# Patient Record
Sex: Female | Born: 1935 | Race: White | Hispanic: No | Smoking: Former smoker
Health system: Southern US, Community
[De-identification: ages and names within clinical notes are randomized; demographics above are authoritative.]

## PROBLEM LIST (undated history)

## (undated) DIAGNOSIS — L565 Disseminated superficial actinic porokeratosis (DSAP): Secondary | ICD-10-CM

## (undated) DIAGNOSIS — I779 Disorder of arteries and arterioles, unspecified: Secondary | ICD-10-CM

## (undated) DIAGNOSIS — M069 Rheumatoid arthritis, unspecified: Secondary | ICD-10-CM

## (undated) DIAGNOSIS — F329 Major depressive disorder, single episode, unspecified: Secondary | ICD-10-CM

## (undated) DIAGNOSIS — I10 Essential (primary) hypertension: Secondary | ICD-10-CM

## (undated) DIAGNOSIS — K589 Irritable bowel syndrome without diarrhea: Secondary | ICD-10-CM

## (undated) DIAGNOSIS — M8580 Other specified disorders of bone density and structure, unspecified site: Secondary | ICD-10-CM

## (undated) DIAGNOSIS — J449 Chronic obstructive pulmonary disease, unspecified: Secondary | ICD-10-CM

## (undated) DIAGNOSIS — G43909 Migraine, unspecified, not intractable, without status migrainosus: Secondary | ICD-10-CM

## (undated) DIAGNOSIS — N183 Chronic kidney disease, stage 3 (moderate): Principal | ICD-10-CM

## (undated) DIAGNOSIS — K219 Gastro-esophageal reflux disease without esophagitis: Secondary | ICD-10-CM

## (undated) DIAGNOSIS — F419 Anxiety disorder, unspecified: Secondary | ICD-10-CM

## (undated) DIAGNOSIS — F32A Depression, unspecified: Secondary | ICD-10-CM

## (undated) DIAGNOSIS — Z860101 Personal history of adenomatous and serrated colon polyps: Secondary | ICD-10-CM

## (undated) DIAGNOSIS — Z86010 Personal history of colonic polyps: Secondary | ICD-10-CM

## (undated) DIAGNOSIS — Z8679 Personal history of other diseases of the circulatory system: Secondary | ICD-10-CM

## (undated) DIAGNOSIS — E785 Hyperlipidemia, unspecified: Secondary | ICD-10-CM

## (undated) DIAGNOSIS — R413 Other amnesia: Secondary | ICD-10-CM

## (undated) DIAGNOSIS — IMO0002 Reserved for concepts with insufficient information to code with codable children: Secondary | ICD-10-CM

## (undated) DIAGNOSIS — I8390 Asymptomatic varicose veins of unspecified lower extremity: Secondary | ICD-10-CM

## (undated) DIAGNOSIS — I739 Peripheral vascular disease, unspecified: Secondary | ICD-10-CM

## (undated) HISTORY — DX: Peripheral vascular disease, unspecified: I73.9

## (undated) HISTORY — DX: Other specified disorders of bone density and structure, unspecified site: M85.80

## (undated) HISTORY — DX: Anxiety disorder, unspecified: F41.9

## (undated) HISTORY — PX: THYROGLOSSAL DUCT CYST: SHX297

## (undated) HISTORY — DX: Asymptomatic varicose veins of unspecified lower extremity: I83.90

## (undated) HISTORY — DX: Migraine, unspecified, not intractable, without status migrainosus: G43.909

## (undated) HISTORY — DX: Hyperlipidemia, unspecified: E78.5

## (undated) HISTORY — DX: Disseminated superficial actinic porokeratosis (DSAP): L56.5

## (undated) HISTORY — DX: Reserved for concepts with insufficient information to code with codable children: IMO0002

## (undated) HISTORY — DX: Chronic kidney disease, stage 3 (moderate): N18.3

## (undated) HISTORY — DX: Gastro-esophageal reflux disease without esophagitis: K21.9

## (undated) HISTORY — DX: Major depressive disorder, single episode, unspecified: F32.9

## (undated) HISTORY — DX: Personal history of other diseases of the circulatory system: Z86.79

## (undated) HISTORY — DX: Depression, unspecified: F32.A

## (undated) HISTORY — DX: Chronic obstructive pulmonary disease, unspecified: J44.9

## (undated) HISTORY — DX: Personal history of colonic polyps: Z86.010

## (undated) HISTORY — DX: Essential (primary) hypertension: I10

## (undated) HISTORY — DX: Rheumatoid arthritis, unspecified: M06.9

## (undated) HISTORY — DX: Disorder of arteries and arterioles, unspecified: I77.9

## (undated) HISTORY — DX: Irritable bowel syndrome, unspecified: K58.9

## (undated) HISTORY — DX: Personal history of adenomatous and serrated colon polyps: Z86.0101

## (undated) HISTORY — DX: Other amnesia: R41.3

---

## 1971-01-18 HISTORY — PX: OTHER SURGICAL HISTORY: SHX169

## 1997-09-14 ENCOUNTER — Inpatient Hospital Stay (HOSPITAL_COMMUNITY): Admission: EM | Admit: 1997-09-14 | Discharge: 1997-09-15 | Payer: Self-pay | Admitting: *Deleted

## 1997-09-15 ENCOUNTER — Encounter: Payer: Self-pay | Admitting: Pulmonary Disease

## 1998-12-08 ENCOUNTER — Other Ambulatory Visit: Admission: RE | Admit: 1998-12-08 | Discharge: 1998-12-08 | Payer: Self-pay | Admitting: *Deleted

## 2001-02-13 ENCOUNTER — Encounter (INDEPENDENT_AMBULATORY_CARE_PROVIDER_SITE_OTHER): Payer: Self-pay

## 2001-02-13 ENCOUNTER — Ambulatory Visit (HOSPITAL_COMMUNITY): Admission: RE | Admit: 2001-02-13 | Discharge: 2001-02-13 | Payer: Self-pay | Admitting: Gastroenterology

## 2004-05-16 ENCOUNTER — Emergency Department (HOSPITAL_COMMUNITY): Admission: EM | Admit: 2004-05-16 | Discharge: 2004-05-16 | Payer: Self-pay | Admitting: Family Medicine

## 2005-05-18 ENCOUNTER — Encounter: Payer: Self-pay | Admitting: Internal Medicine

## 2005-05-18 ENCOUNTER — Encounter: Payer: Self-pay | Admitting: *Deleted

## 2006-07-10 ENCOUNTER — Encounter: Payer: Self-pay | Admitting: Internal Medicine

## 2006-08-15 ENCOUNTER — Encounter: Payer: Self-pay | Admitting: Internal Medicine

## 2006-10-11 ENCOUNTER — Ambulatory Visit: Payer: Self-pay | Admitting: Internal Medicine

## 2006-10-15 ENCOUNTER — Encounter: Payer: Self-pay | Admitting: Internal Medicine

## 2006-10-15 DIAGNOSIS — I2510 Atherosclerotic heart disease of native coronary artery without angina pectoris: Secondary | ICD-10-CM | POA: Insufficient documentation

## 2006-10-15 DIAGNOSIS — Z8679 Personal history of other diseases of the circulatory system: Secondary | ICD-10-CM

## 2006-10-15 DIAGNOSIS — E785 Hyperlipidemia, unspecified: Secondary | ICD-10-CM

## 2006-10-15 DIAGNOSIS — I10 Essential (primary) hypertension: Secondary | ICD-10-CM | POA: Insufficient documentation

## 2006-10-15 DIAGNOSIS — J439 Emphysema, unspecified: Secondary | ICD-10-CM | POA: Insufficient documentation

## 2006-10-15 DIAGNOSIS — J449 Chronic obstructive pulmonary disease, unspecified: Secondary | ICD-10-CM

## 2006-10-15 DIAGNOSIS — G43909 Migraine, unspecified, not intractable, without status migrainosus: Secondary | ICD-10-CM | POA: Insufficient documentation

## 2006-10-15 DIAGNOSIS — F411 Generalized anxiety disorder: Secondary | ICD-10-CM | POA: Insufficient documentation

## 2006-10-15 DIAGNOSIS — J4489 Other specified chronic obstructive pulmonary disease: Secondary | ICD-10-CM | POA: Insufficient documentation

## 2006-10-15 HISTORY — DX: Personal history of other diseases of the circulatory system: Z86.79

## 2007-04-09 ENCOUNTER — Ambulatory Visit: Payer: Self-pay | Admitting: Internal Medicine

## 2007-04-09 DIAGNOSIS — K589 Irritable bowel syndrome without diarrhea: Secondary | ICD-10-CM

## 2007-04-10 DIAGNOSIS — F3289 Other specified depressive episodes: Secondary | ICD-10-CM | POA: Insufficient documentation

## 2007-04-10 DIAGNOSIS — F329 Major depressive disorder, single episode, unspecified: Secondary | ICD-10-CM

## 2007-04-10 LAB — CONVERTED CEMR LAB
ALT: 23 units/L (ref 0–35)
AST: 21 units/L (ref 0–37)
Albumin: 4.2 g/dL (ref 3.5–5.2)
Alkaline Phosphatase: 34 units/L — ABNORMAL LOW (ref 39–117)
BUN: 15 mg/dL (ref 6–23)
Basophils Absolute: 0 10*3/uL (ref 0.0–0.1)
Basophils Relative: 0.5 % (ref 0.0–1.0)
Bilirubin, Direct: 0.1 mg/dL (ref 0.0–0.3)
CO2: 30 meq/L (ref 19–32)
Calcium: 10 mg/dL (ref 8.4–10.5)
Chloride: 104 meq/L (ref 96–112)
Cholesterol: 227 mg/dL (ref 0–200)
Creatinine, Ser: 1 mg/dL (ref 0.4–1.2)
Direct LDL: 158.6 mg/dL
Eosinophils Absolute: 0.2 10*3/uL (ref 0.0–0.6)
Eosinophils Relative: 2.4 % (ref 0.0–5.0)
GFR calc Af Amer: 71 mL/min
GFR calc non Af Amer: 58 mL/min
Glucose, Bld: 113 mg/dL — ABNORMAL HIGH (ref 70–99)
HCT: 44.4 % (ref 36.0–46.0)
HDL: 52 mg/dL (ref >39.0)
Hemoglobin: 14.7 g/dL (ref 12.0–15.0)
Lymphocytes Relative: 22 % (ref 12.0–46.0)
MCHC: 33 g/dL (ref 30.0–36.0)
MCV: 95.2 fL (ref 78.0–100.0)
Monocytes Absolute: 0.5 10*3/uL (ref 0.2–0.7)
Monocytes Relative: 7.2 % (ref 3.0–11.0)
Neutro Abs: 4.4 10*3/uL (ref 1.4–7.7)
Neutrophils Relative %: 67.9 % (ref 43.0–77.0)
Platelets: 251 10*3/uL (ref 150–400)
Potassium: 4.1 meq/L (ref 3.5–5.1)
RBC: 4.66 M/uL (ref 3.87–5.11)
RDW: 12.3 % (ref 11.5–14.6)
Sodium: 143 meq/L (ref 135–145)
TSH: 0.9 microintl units/mL (ref 0.35–5.50)
Total Bilirubin: 1 mg/dL (ref 0.3–1.2)
Total CHOL/HDL Ratio: 4.4
Total Protein: 7 g/dL (ref 6.0–8.3)
Triglycerides: 74 mg/dL (ref 0–149)
VLDL: 15 mg/dL (ref 0–40)
WBC: 6.5 10*3/uL (ref 4.5–10.5)

## 2007-04-16 ENCOUNTER — Ambulatory Visit: Payer: Self-pay | Admitting: Internal Medicine

## 2007-07-26 ENCOUNTER — Other Ambulatory Visit: Admission: RE | Admit: 2007-07-26 | Discharge: 2007-07-26 | Payer: Self-pay | Admitting: Geriatric Medicine

## 2009-05-11 ENCOUNTER — Encounter: Payer: Self-pay | Admitting: Cardiology

## 2009-05-21 ENCOUNTER — Encounter: Admission: RE | Admit: 2009-05-21 | Discharge: 2009-05-21 | Payer: Self-pay | Admitting: Family Medicine

## 2009-05-21 ENCOUNTER — Ambulatory Visit: Payer: Self-pay | Admitting: Cardiology

## 2009-06-09 ENCOUNTER — Telehealth (INDEPENDENT_AMBULATORY_CARE_PROVIDER_SITE_OTHER): Payer: Self-pay

## 2009-06-10 ENCOUNTER — Ambulatory Visit: Payer: Self-pay

## 2009-06-10 ENCOUNTER — Ambulatory Visit (HOSPITAL_COMMUNITY): Admission: RE | Admit: 2009-06-10 | Discharge: 2009-06-10 | Payer: Self-pay | Admitting: Cardiology

## 2009-06-10 ENCOUNTER — Encounter (HOSPITAL_COMMUNITY): Admission: RE | Admit: 2009-06-10 | Discharge: 2009-08-19 | Payer: Self-pay | Admitting: Cardiology

## 2009-06-10 ENCOUNTER — Ambulatory Visit: Payer: Self-pay | Admitting: Internal Medicine

## 2009-07-02 ENCOUNTER — Telehealth (INDEPENDENT_AMBULATORY_CARE_PROVIDER_SITE_OTHER): Payer: Self-pay | Admitting: *Deleted

## 2009-08-21 ENCOUNTER — Ambulatory Visit: Payer: Self-pay | Admitting: Cardiology

## 2010-02-18 NOTE — Assessment & Plan Note (Signed)
Summary: Cardiology Nuclear Study  Nuclear Med Background Indications for Stress Test: Evaluation for Ischemia   History: COPD, Echo, Emphysema, Myocardial Perfusion Study  History Comments: '99 MPS at [facility]: no report in E-chart. Hx. Diastolic dysfunction Echo done today.  Symptoms: Chest Pressure, DOE, Fatigue, Fatigue with Exertion, Palpitations, Rapid HR  Symptoms Comments: LL Edema, some back pain.   Nuclear Pre-Procedure Cardiac Risk Factors: Family History - CAD, History of Smoking, Hypertension, Lipids Caffeine/Decaff Intake: None NPO After: 6:00 PM Lungs: Clear IV 0.9% NS with Angio Cath: 22g     IV Site: (R) AC IV Started by: Irean Hong RN Chest Size (in) 44     Cup Size B     Height (in): 64 Weight (lb): 183 BMI: 31.53 Tech Comments: Held coreg x 24 hrs.  Nuclear Med Study 1 or 2 day study:  1 day     Stress Test Type:  Stress Reading MD:  Arvilla Meres, MD     Referring MD:  B. Crenshaw Resting Radionuclide:  Technetium 72m Tetrofosmin     Resting Radionuclide Dose:  11.0 mCi  Stress Radionuclide:  Technetium 65m Tetrofosmin     Stress Radionuclide Dose:  33.0 mCi   Stress Protocol Exercise Time (min):  6:30 min     Max HR:  120 bpm     Predicted Max HR:  149 bpm  Max Systolic BP: 173 mm Hg     Percent Max HR:  80.54 %     METS: 7.0 Rate Pressure Product:  36644    Stress Test Technologist:  Irean Hong RN     Nuclear Technologist:  Domenic Polite CNMT  Rest Procedure  Myocardial perfusion imaging was performed at rest 45 minutes following the intravenous administration of Myoview Technetium 12m Tetrofosmin.  Stress Procedure  The patient exercised for 6 minutes and 30 seconds, RPE=15.   The patient stopped due to bilateral calf tightness 5/10, DOE with O2 sat 93%,     and denied any chest pain.  There were nonspecific ST-T wave changes, frequent PAC's, occ. PVC's.  Myoview was injected at peak exercise and myocardial perfusion imaging was performed  after a brief delay.  QPS Raw Data Images:  Normal; no motion artifact; normal heart/lung ratio. Stress Images:  There is normal uptake in all areas. Rest Images:  Normal homogeneous uptake in all areas of the myocardium. Subtraction (SDS):  Normal Transient Ischemic Dilatation:  .93  (Normal <1.22)  Lung/Heart Ratio:  .29  (Normal <0.45)  Quantitative Gated Spect Images QGS cine images:  not gated due to PACs  Findings Normal nuclear study      Overall Impression  Exercise Capacity: Fair exercise capacity. BP Response: Normal blood pressure response. Clinical Symptoms: There is dyspnea. Bilateral calf pain.  ECG Impression: No significant ST segment change suggestive of ischemia. +PACs Overall Impression: Normal stress nuclear study. Overall Impression Comments: Study not gated due to PACs  Appended Document: Cardiology Nuclear Study ok  Appended Document: Cardiology Nuclear Study LMOPT - labs negative, normal, or stable  Appended Document: Cardiology Nuclear Study pt aware of results

## 2010-02-18 NOTE — Assessment & Plan Note (Signed)
Summary: PER CHECK OUT/SF   CC:  check up.  History of Present Illness: 75 year old female I saw in May of 2011 for evaluation of congestive heart failure and hypertension. Echocardiogram in May of 2011 showed normal LV function, mild left atrial enlargement and grade 1 diastolic dysfunction. There was mild pulmonary hypertension. Patient had a Myoview in May of 2011. Perfusion was normal. The study was not gated. We felt that her dyspnea was most likely secondary to COPD. Since she was seen previously she feels better. The patient  has dyspnea with more extreme activities but not with routine activities. It is relieved with rest. It is not associated with chest pain. There is no orthopnea, PND or pedal edema. There is no syncope or palpitations. There is no exertional chest pain.   Current Medications (verified): 1)  Micardis 80 Mg Tabs (Telmisartan) .Marland Kitchen.. 1 Tab By Mouth Once Daily 2)  Coreg Cr 20 Mg Xr24h-Cap (Carvedilol Phosphate) .Marland Kitchen.. 1 Tab By Mouth Once Daily 3)  Alprazolam 0.25 Mg  Tabs (Alprazolam) .Marland Kitchen.. 1po Once Daily As Needed 4)  Caltrate 600+d 600-400 Mg-Unit  Tabs (Calcium Carbonate-Vitamin D) .Marland Kitchen.. 1 By Mouth Qd 5)  Centrum Silver   Tabs (Multiple Vitamins-Minerals) .Marland Kitchen.. 1 By Mouth Qd 6)  Vitamin B-12 500 Mcg  Tabs (Cyanocobalamin) .Marland Kitchen.. 1 By Mouth Qd 7)  Spiriva Handihaler 18 Mcg Caps (Tiotropium Bromide Monohydrate) .... As Directed 8)  Patanol 0.1 % Soln (Olopatadine Hcl) .... As Directed 9)  Aspirin 81 Mg Tbec (Aspirin) .Marland Kitchen.. 1 Tab By Mouth Bid 10)  Soya Lecithin 1200 Mg Caps (Lecithin) .Marland Kitchen.. 1 Tab By Mouth Once Daily 11)  Folic Acid 1 Mg Tabs (Folic Acid) .Marland Kitchen.. 1 Tab Once Daily 12)  Fish Oil   Oil (Fish Oil) .Marland Kitchen.. 1 Tab By Mouth Once Daily 13)  Flax   Oil (Flaxseed (Linseed)) .Marland Kitchen.. 1 Tab By Mouth Once Daily 14)  Alcon Tears .... As Directed 15)  Coreg Cr 10 Mg Xr24h-Cap (Carvedilol Phosphate) .Marland Kitchen.. 1 Tab By Mouth Once Daily 16)  Hydrochlorothiazide 25 Mg Tabs (Hydrochlorothiazide)  .... Take One Tablet By Mouth Daily. 17)  Icaps  Caps (Multiple Vitamins-Minerals) .Marland Kitchen.. 1 Tab By Mouth Once Daily 18)  Epipen .... As Needed 19)  Atrovent 0.06 % Soln (Ipratropium Bromide) .... Prn 20)  Doxycycline Hyclate 100 Mg Caps (Doxycycline Hyclate) .... As Needed 21)  Claritin 10 Mg Tabs (Loratadine) .... As Needed  Allergies: 1)  ! Pcn 2)  ! * Benzocaine 3)  ! Benadryl 4)  ! * Omnicef 5)  ! * Tiobramycin Eye Gtts 6)  ! * E-Mycin 7)  ! * Benzocaine 8)  ! Cipro 9)  ! Indocin 10)  ! Benadryl 11)  ! * Bees 12)  ! * Statins 13)  ! * Alendronate 14)  ! * Azithromycin 15)  ! * Zetia 16)  ! Adhesive Tape 17)  ! Adhesive Tape  Past History:  Past Medical History: Reviewed history from 05/21/2009 and no changes required. HYPERTENSION (ICD-401.9) HYPERLIPIDEMIA (ICD-272.4) IBS (ICD-564.1) PEPTIC ULCER DISEASE (ICD-533.90)secondary to NSAID - 1975 DEPRESSION (ICD-311) H/O scarlet fever EMPHYSEMA (ICD-492.8) OSTEOPOROSIS (ICD-733.00) GERD (ICD-530.81) COLONIC POLYPS, HX OF (ICD-V12.72)hx of - adenomatous ANXIETY (ICD-300.00) HEMORRHOIDS, HX OF (ICD-V12.79) MIGRAINE HEADACHE (ICD-346.90) Rheumatoid arthritis hx of bilat frozen shoulders H/O diastolic dysfunction  Past Surgical History: Reviewed history from 05/21/2009 and no changes required. Thyroglossal Duct Cyst- 1943, 1944, 1973 Chronically Infected Lymph Node removed-1973  Social History: Reviewed history from 05/21/2009 and no changes required.  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 Remaining systems are negative.   Vital Signs:  Patient profile:   75 year old female Height:      64 inches Weight:      186 pounds BMI:      32.04 Pulse rate:   64 / minute Resp:     14 per minute BP sitting:   138 / 74  (left arm)  Vitals Entered By: Kem Parkinson (August 21, 2009 9:15 AM)  Physical Exam  General:  Well-developed well-nourished in no acute distress.  Skin is warm and dry.  HEENT is normal.  Neck is supple. No thyromegaly.  Chest is clear to auscultation with normal expansion.  Cardiovascular exam is regular rate and rhythm.  Abdominal exam nontender or distended. No masses palpated. Extremities show no edema. neuro grossly intact    EKG  Procedure date:  08/21/2009  Findings:      Normal sinus rhythm at a rate of 64. Axis normal. No ischemic changes.  Impression & Recommendations:  Problem # 1:  DYSPNEA (ICD-786.05) Symptoms have improved. Myoview normal. Echo showed normal LV function with grade 1 diastolic dysfunction. Lasix made her dizzy and what sounds to be orthostatic. She feels much better on HCTZ. We'll continue. Take additional dose daily as needed for edema. The following medications were removed from the medication list:    Furosemide 40 Mg Tabs (Furosemide) .Marland Kitchen... Take one tablet by mouth daily. Her updated medication list for this problem includes:    Micardis 80 Mg Tabs (Telmisartan) .Marland Kitchen... 1 tab by mouth once daily    Coreg Cr 20 Mg Xr24h-cap (Carvedilol phosphate) .Marland Kitchen... 1 tab by mouth once daily    Aspirin 81 Mg Tbec (Aspirin) .Marland Kitchen... 1 tab by mouth bid    Coreg Cr 10 Mg Xr24h-cap (Carvedilol phosphate) .Marland Kitchen... 1 tab by mouth once daily    Hydrochlorothiazide 25 Mg Tabs (Hydrochlorothiazide) .Marland Kitchen... Take one tablet by mouth daily.  Problem # 2:  HYPERTENSION (ICD-401.9)  Blood pressure controlled on present medications. Will continue.  The following medications were removed from the medication list:    Furosemide 40 Mg Tabs (Furosemide) .Marland Kitchen... Take one tablet by mouth daily. Her updated medication list for this problem includes:    Micardis 80 Mg Tabs (Telmisartan) .Marland Kitchen... 1 tab  by mouth once daily    Coreg Cr 20 Mg Xr24h-cap (Carvedilol phosphate) .Marland Kitchen... 1 tab by mouth once daily    Aspirin 81 Mg Tbec (Aspirin) .Marland Kitchen... 1 tab by mouth bid    Coreg Cr 10 Mg Xr24h-cap (Carvedilol phosphate) .Marland Kitchen... 1 tab by mouth once daily    Hydrochlorothiazide 25 Mg Tabs (Hydrochlorothiazide) .Marland Kitchen... Take one tablet by mouth daily.  Problem # 3:  HYPERLIPIDEMIA (ICD-272.4) Management per primary care.  Problem # 4:  EMPHYSEMA (ICD-492.8)  Patient Instructions: 1)  Your physician recommends that you schedule a follow-up appointment in: 1 year 2)  Your physician recommends that you continue on your current medications as directed. Please refer to the Current Medication list given to you today.

## 2010-02-18 NOTE — Assessment & Plan Note (Signed)
Summary: np6/ chf/ htn/ pt has medicare , aarp/arrived at 3:15/ gd   History of Present Illness: 75 year old female for evaluation of congestive heart failure and hypertension. Patient was seen in the past by Dr. Patty Sermons and told she had diastolic dysfunction. I do not have those records available. She was recently seen by Dr. Alwyn Ren for lower extremity edema. She was given Lasix. A BNP was mildly elevated and her Lasix was increased to 40 mg p.o. daily. We were asked to evaluate for this issue, dyspnea and hypertension. There was concern about the possibility of early onset of congestive heart failure. Of note she has a long history of emphysema. She has chronic dyspnea on exertion but this is worse over the past one month. There is no orthopnea or PND but she does have chronic pedal edema which is worse recently. It has improved with Lasix. She has not had chest pain, palpitations or syncope.  Current Medications (verified): 1)  Micardis 80 Mg Tabs (Telmisartan) .Marland Kitchen.. 1 Tab By Mouth Once Daily 2)  Coreg Cr 40 Mg Xr24h-Cap (Carvedilol Phosphate) .Marland Kitchen.. 1 Tab By Mouth Once Daily 3)  Alprazolam 0.25 Mg  Tabs (Alprazolam) .Marland Kitchen.. 1po Once Daily As Needed 4)  Caltrate 600+d 600-400 Mg-Unit  Tabs (Calcium Carbonate-Vitamin D) .Marland Kitchen.. 1 By Mouth Qd 5)  Centrum Silver   Tabs (Multiple Vitamins-Minerals) .Marland Kitchen.. 1 By Mouth Qd 6)  Vitamin B-12 500 Mcg  Tabs (Cyanocobalamin) .Marland Kitchen.. 1 By Mouth Qd 7)  Spiriva Handihaler 18 Mcg Caps (Tiotropium Bromide Monohydrate) .... As Directed 8)  Proair Hfa 108 (90 Base) Mcg/act Aers (Albuterol Sulfate) .... As Directed 9)  Patanol 0.1 % Soln (Olopatadine Hcl) .... As Directed 10)  Aspirin 81 Mg Tbec (Aspirin) .... Take One Tablet By Mouth Daily 11)  Soya Lecithin 1200 Mg Caps (Lecithin) .Marland Kitchen.. 1 Tab By Mouth Once Daily 12)  Folic Acid 1 Mg Tabs (Folic Acid) .Marland Kitchen.. 1 Tab Once Daily 13)  Furosemide 40 Mg Tabs (Furosemide) .... Take One Tablet By Mouth Daily. 14)  Fish Oil   Oil (Fish  Oil) .Marland Kitchen.. 1 Tab By Mouth Once Daily 15)  Flax   Oil (Flaxseed (Linseed)) .Marland Kitchen.. 1 Tab By Mouth Once Daily 16)  Alcon Tears .... As Directed  Allergies: 1)  ! Pcn 2)  ! * Benzocaine 3)  ! Benadryl 4)  ! * Omnicef 5)  ! * Tiobramycin Eye Gtts 6)  ! * E-Mycin 7)  ! * Benzocaine 8)  ! Cipro 9)  ! Indocin 10)  ! Benadryl 11)  ! * Bees 12)  ! * Statins 13)  ! * Alendronate 14)  ! * Azithromycin 15)  ! * Zetia 16)  ! Adhesive Tape 17)  ! Adhesive Tape  Past History:  Past Medical History: HYPERTENSION (ICD-401.9) HYPERLIPIDEMIA (ICD-272.4) IBS (ICD-564.1) PEPTIC ULCER DISEASE (ICD-533.90)secondary to NSAID - 1975 DEPRESSION (ICD-311) H/O scarlet fever EMPHYSEMA (ICD-492.8) OSTEOPOROSIS (ICD-733.00) GERD (ICD-530.81) COLONIC POLYPS, HX OF (ICD-V12.72)hx of - adenomatous ANXIETY (ICD-300.00) HEMORRHOIDS, HX OF (ICD-V12.79) MIGRAINE HEADACHE (ICD-346.90) Rheumatoid arthritis hx of bilat frozen shoulders H/O diastolic dysfunction  Past Surgical History: Thyroglossal Duct Cyst- 1943, 1944, 1973 Chronically Infected Lymph Node removed-1973  Family History: Reviewed history from 04/09/2007 and no changes required. Mother with heart disease, HTN father with brain tumore grandmother with "stomach" cancer brother with melanoma, colon cancer, hearyt disease sister with brain anueurysm several with ETOH Brother with MI at age 62  Social History: Reviewed history from 04/09/2007 and no changes required. 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 Remaining systems are negative.   Vital Signs:  Patient profile:   74 year old female Weight:      180 pounds Pulse rate:   70 / minute Resp:      14 per minute BP sitting:   134 / 80  (left arm)  Vitals Entered By: Kem Parkinson (May 21, 2009 3:22 PM)  Physical Exam  General:  Well developed/well nourished in NAD Skin warm/dry Patient not depressed No peripheral clubbing Back-normal HEENT-normal/normal eyelids Neck supple/normal carotid upstroke bilaterally; no bruits; no JVD; no thyromegaly chest - diminished breath sounds throughout but otherwise clear to auscultation. CV - RRR/normal S1 and S2; no murmurs, rubs or gallops;  PMI nondisplaced Abdomen -NT/ND, no HSM, no mass, + bowel sounds, no bruit 2+ femoral pulses, no bruits Ext-trace edema, chords, 2+ DP Neuro-grossly nonfocal     EKG  Procedure date:  05/05/2009  Findings:      Sinus rhythm with no ST changes.  Impression & Recommendations:  Problem # 1:  DYSPNEA (ICD-786.05) This is most likely secondary to her COPD. I will schedule a Myoview to exclude ischemia. I will also schedule an echocardiogram to quantify LV function and to evaluate diastolic function. Her BNP was only minimally elevated. She will continue on her present dose of diuretics. Her renal function and potassium are being monitored by her primary care. The following medications were removed from the medication list:    Hydrochlorothiazide 25 Mg Tabs (Hydrochlorothiazide) .Marland Kitchen... 1 by mouth qd    Aspirin Powd (Aspirin) .Marland Kitchen... 81 mg 1 by mouth qd Her updated medication list for this problem includes:    Micardis 80 Mg Tabs (Telmisartan) .Marland Kitchen... 1 tab by mouth once daily    Coreg Cr 40 Mg Xr24h-cap (Carvedilol phosphate) .Marland Kitchen... 1 tab by mouth once daily    Aspirin 81 Mg Tbec (Aspirin) .Marland Kitchen... Take one tablet by mouth daily    Furosemide 40 Mg Tabs (Furosemide) .Marland Kitchen... Take one tablet by mouth daily.  Orders: Echocardiogram (Echo)  Problem # 2:  HYPERTENSION (ICD-401.9) Her blood pressure is controlled on present medications. Will continue. The following medications were removed from the  medication list:    Hydrochlorothiazide 25 Mg Tabs (Hydrochlorothiazide) .Marland Kitchen... 1 by mouth qd    Aspirin Powd (Aspirin) .Marland Kitchen... 81 mg 1 by mouth qd Her updated medication list for this problem includes:    Micardis 80 Mg Tabs (Telmisartan) .Marland Kitchen... 1 tab by mouth once daily    Coreg Cr 40 Mg Xr24h-cap (Carvedilol phosphate) .Marland Kitchen... 1 tab by mouth once daily    Aspirin 81 Mg Tbec (Aspirin) .Marland Kitchen... Take one tablet by mouth daily    Furosemide 40 Mg Tabs (Furosemide) .Marland Kitchen... Take one tablet by mouth daily.  Problem # 3:  HYPERLIPIDEMIA (ICD-272.4) Management per primary care.  Problem # 4:  IBS (ICD-564.1)  Problem # 5:  EMPHYSEMA (ICD-492.8)  Problem # 6:  GERD (ICD-530.81)  Other Orders: Nuclear Stress Test (Nuc Stress Test)  Patient Instructions: 1)  Your physician recommends that you schedule a follow-up appointment in: 3 MONTHS 2)  Your physician has requested that you have an echocardiogram.  Echocardiography is a painless test that uses sound waves to create images of your heart.  It provides your doctor with information about the size and shape of your heart and how well your heart's chambers and valves are working.  This procedure takes approximately one hour. There are no restrictions for this procedure. 3)  Your physician has requested that you have an exercise stress myoview.  For further information please visit [email or web address]  Please follow instruction sheet, as given.

## 2010-02-18 NOTE — Progress Notes (Signed)
  Stress,Echo faxed to [facility] [phone] [facility]  July 02, 2009 1:40 PM

## 2010-02-18 NOTE — Letter (Signed)
Summary: [facility]   Imported By: Kassie Mends 06/04/2009 09:33:43  _____________________________________________________________________  External Attachment:    Type:   Image     Comment:   External Document

## 2010-02-18 NOTE — Progress Notes (Signed)
Summary: Nuc. Pre-Procedure  Phone Note Outgoing Call Call back at [facility] Phone [phone]   Call placed by: Irean Hong, RN,  Jun 09, 2009 2:12 PM Summary of Call: Reviewed information on Myoview Information Sheet (see scanned document for further details).  Spoke with patient.     Nuclear Med Background Indications for Stress Test: Evaluation for Ischemia   History: COPD, Emphysema, Myocardial Perfusion Study  History Comments: '99 MPS at [facility]: no report in E-chart. Hx. Diastolic dysfunction  Symptoms: DOE  Symptoms Comments: LL Edema, some back pain.   Nuclear Pre-Procedure Cardiac Risk Factors: Family History - CAD, History of Smoking, Hypertension, Lipids

## 2010-07-02 ENCOUNTER — Encounter: Payer: Self-pay | Admitting: Cardiology

## 2010-08-06 ENCOUNTER — Other Ambulatory Visit: Payer: Self-pay | Admitting: Family Medicine

## 2010-08-06 DIAGNOSIS — R0989 Other specified symptoms and signs involving the circulatory and respiratory systems: Secondary | ICD-10-CM

## 2010-08-09 ENCOUNTER — Ambulatory Visit
Admission: RE | Admit: 2010-08-09 | Discharge: 2010-08-09 | Disposition: A | Discharge status: Home / Self Care | Payer: PRIVATE HEALTH INSURANCE | Source: Ambulatory Visit | Attending: Family Medicine | Admitting: Family Medicine

## 2010-08-09 DIAGNOSIS — R0989 Other specified symptoms and signs involving the circulatory and respiratory systems: Secondary | ICD-10-CM

## 2010-08-20 ENCOUNTER — Encounter: Payer: Self-pay | Admitting: Cardiology

## 2010-08-20 ENCOUNTER — Ambulatory Visit (INDEPENDENT_AMBULATORY_CARE_PROVIDER_SITE_OTHER): Payer: Medicare Other | Admitting: Cardiology

## 2010-08-20 DIAGNOSIS — I10 Essential (primary) hypertension: Secondary | ICD-10-CM

## 2010-08-20 DIAGNOSIS — R0602 Shortness of breath: Secondary | ICD-10-CM

## 2010-08-20 DIAGNOSIS — E785 Hyperlipidemia, unspecified: Secondary | ICD-10-CM

## 2010-08-20 DIAGNOSIS — I679 Cerebrovascular disease, unspecified: Secondary | ICD-10-CM

## 2010-08-20 NOTE — Assessment & Plan Note (Signed)
Patient brought records of her blood pressures at home. They are controlled. Continue present medications.

## 2010-08-20 NOTE — Assessment & Plan Note (Signed)
I think this is predominantly related to COPD. Continue present dose of HCTZ for history of diastolic dysfunction.

## 2010-08-20 NOTE — Assessment & Plan Note (Signed)
Recently found to have moderate cerebrovascular disease by her report. Her primary care is following this. Continue aspirin. Intolerant to statins.

## 2010-08-20 NOTE — Progress Notes (Signed)
[id number] female I saw in May of 2011 for evaluation of congestive heart failure and hypertension. Echocardiogram in May of 2011 showed normal LV function, mild left atrial enlargement and grade 1 diastolic dysfunction. There was mild pulmonary hypertension. Patient had a Myoview in May of 2011. Perfusion was normal. The study was not gated. We felt that her dyspnea was most likely secondary to COPD. I last saw her in August of 2011. Since then, she continues to have dyspnea on exertion which is worse during the summer months. There is no orthopnea or PND. Occasional mild pedal edema. No chest pain.  Current Outpatient Prescriptions  Medication Sig Dispense Refill  . albuterol (PROVENTIL) (2.5 MG/3ML) 0.083% nebulizer solution Take 2.5 mg by nebulization every 6 (six) hours as needed.        . ALPRAZolam (XANAX) 0.25 MG tablet Take 0.25 mg by mouth at bedtime as needed.        Marland Kitchen aspirin 81 MG tablet Take 81 mg by mouth 2 (two) times daily.        . Calcium Carbonate-Vitamin D 600-400 MG-UNIT per tablet Take 1 tablet by mouth daily.        . carvedilol (COREG CR) 10 MG 24 hr capsule Take 20 mg by mouth daily.       . DHA-EPA-Flaxseed Oil-Vitamin E (THERA TEARS NUTRITION PO) Take by mouth daily.        Marland Kitchen doxycycline (VIBRAMYCIN) 100 MG capsule Take 100 mg by mouth as needed.        Marland Kitchen EPINEPHrine (EPIPEN IJ) Inject as directed as needed.        . Fish Oil OIL 1 tablet by Does not apply route daily.        . Flax OIL Take by mouth daily.        . folic acid (FOLVITE) 1 MG tablet Take 1 mg by mouth daily.        . hydrochlorothiazide 25 MG tablet Take 25 mg by mouth daily.        Marland Kitchen loratadine (CLARITIN) 10 MG tablet Take 10 mg by mouth as needed.        . Magnesium 250 MG TABS Take by mouth daily.        . Multiple Vitamins-Minerals (CENTRUM SILVER PO) Take 1 tablet by mouth daily.        . Multiple Vitamins-Minerals (ICAPS) CAPS Take 1 capsule by mouth daily.        Marland Kitchen telmisartan (MICARDIS) 80 MG  tablet Take 80 mg by mouth daily.        Marland Kitchen tiotropium (SPIRIVA) 18 MCG inhalation capsule Use as directed       . vitamin B-12 (CYANOCOBALAMIN) 500 MCG tablet Take 500 mcg by mouth daily.           Past Medical History  Diagnosis Date  . HTN (hypertension)   . Hyperlipidemia   . IBS (irritable bowel syndrome)   . PUD (peptic ulcer disease)     secondary to NSAID-1975  . Depression   . Scarlet fever   . Emphysema   . Osteoporosis   . GERD (gastroesophageal reflux disease)   . Hx of adenomatous colonic polyps   . Anxiety   . Hemorrhoids   . Migraine headache   . Rheumatoid arthritis   . Diastolic dysfunction   . History of colonoscopy     Past Surgical History  Procedure Date  . Thyroglossal duct cyst     1943, 1944, 1973  .  Chronically infected lymph node removed 1973    History   Social History  . Marital Status: Divorced    Spouse Name: N/A    Number of Children: 3  . Years of Education: N/A   Occupational History  . Retired-homemaker    Social History Main Topics  . Smoking status: Former Games developer  . Smokeless tobacco: Not on file   Comment: previous 80 pack year history  . Alcohol Use: Yes     occasional  . Drug Use: Not on file  . Sexually Active: Not on file   Other Topics Concern  . Not on file   Social History Narrative  . No narrative on file    ROS: no fevers or chills, productive cough, hemoptysis, dysphasia, odynophagia, melena, hematochezia, dysuria, hematuria, rash, seizure activity, orthopnea, PND, pedal edema, claudication. Remaining systems are negative.  Physical Exam: Well-developed well-nourished in no acute distress.  Skin is warm and dry.  HEENT is normal.  Neck is supple. No thyromegaly.  Right carotid bruit Chest is diminished breath sounds throughout Cardiovascular exam is regular rate and rhythm.  Abdominal exam nontender or distended. No masses palpated. Extremities show trace edema. neuro grossly intact  ECG NSR with  occasional PVC

## 2010-08-20 NOTE — Patient Instructions (Signed)
Follow up in 1 year with Dr Jens Som.  You will receive a letter in the mail 2 months before you are due.  Please call us when you receive this letter to schedule your follow up appointment.  The current medical regimen is effective;  continue present plan and medications.

## 2010-08-20 NOTE — Assessment & Plan Note (Signed)
Intolerant to statins. Management per primary care.

## 2011-01-06 ENCOUNTER — Ambulatory Visit (INDEPENDENT_AMBULATORY_CARE_PROVIDER_SITE_OTHER): Payer: Medicare Other

## 2011-01-06 DIAGNOSIS — J209 Acute bronchitis, unspecified: Secondary | ICD-10-CM

## 2011-03-04 ENCOUNTER — Ambulatory Visit (INDEPENDENT_AMBULATORY_CARE_PROVIDER_SITE_OTHER): Payer: Medicare Other | Admitting: Internal Medicine

## 2011-03-04 ENCOUNTER — Encounter: Payer: Self-pay | Admitting: Internal Medicine

## 2011-03-04 DIAGNOSIS — J209 Acute bronchitis, unspecified: Secondary | ICD-10-CM | POA: Diagnosis not present

## 2011-03-04 DIAGNOSIS — J40 Bronchitis, not specified as acute or chronic: Secondary | ICD-10-CM

## 2011-03-04 DIAGNOSIS — J449 Chronic obstructive pulmonary disease, unspecified: Secondary | ICD-10-CM

## 2011-03-04 MED ORDER — DOXYCYCLINE HYCLATE 100 MG PO TABS: 100.0000 mg | ORAL_TABLET | Freq: Two times a day (BID) | ORAL | Status: AC

## 2011-03-04 MED ORDER — HYDROCODONE-ACETAMINOPHEN 7.5-500 MG/15ML PO SOLN: 5.0000 mL | Freq: Four times a day (QID) | ORAL | Status: AC | PRN

## 2011-03-04 NOTE — Progress Notes (Signed)
  Subjective:    Patient ID: Melissa Ferrell, female    DOB: August 27, 1935, 76 y.o.   MRN: [id number]  HPI  COPD Acute bronchitis  Review of Systems   unchanged Objective:   Physical Exam  Pulmonary/Chest: Effort normal. No accessory muscle usage. Not tachypneic. No respiratory distress. She has wheezes in the right upper field, the right middle field and the left lower field. She has rhonchi in the right middle field, the right lower field and the left upper field.   VS are nl       Assessment & Plan:   Doxy 100mg  bid lortab 1tsp q 6hr prn

## 2011-03-09 ENCOUNTER — Ambulatory Visit (INDEPENDENT_AMBULATORY_CARE_PROVIDER_SITE_OTHER): Payer: Medicare Other | Admitting: Family Medicine

## 2011-03-09 ENCOUNTER — Other Ambulatory Visit: Payer: Self-pay

## 2011-03-09 VITALS — BP 179/79 | HR 79 | Temp 98.1°F | Resp 18 | Ht 63.75 in | Wt 195.0 lb

## 2011-03-09 DIAGNOSIS — F411 Generalized anxiety disorder: Secondary | ICD-10-CM | POA: Diagnosis not present

## 2011-03-09 DIAGNOSIS — J441 Chronic obstructive pulmonary disease with (acute) exacerbation: Secondary | ICD-10-CM

## 2011-03-09 DIAGNOSIS — J449 Chronic obstructive pulmonary disease, unspecified: Secondary | ICD-10-CM | POA: Diagnosis not present

## 2011-03-09 DIAGNOSIS — I10 Essential (primary) hypertension: Secondary | ICD-10-CM

## 2011-03-09 DIAGNOSIS — F419 Anxiety disorder, unspecified: Secondary | ICD-10-CM

## 2011-03-09 MED ORDER — ALBUTEROL SULFATE HFA 108 (90 BASE) MCG/ACT IN AERS: 2.0000 | INHALATION_SPRAY | Freq: Four times a day (QID) | RESPIRATORY_TRACT | Status: DC | PRN

## 2011-03-09 MED ORDER — TIOTROPIUM BROMIDE MONOHYDRATE 18 MCG IN CAPS: 18.0000 ug | ORAL_CAPSULE | Freq: Every day | RESPIRATORY_TRACT | Status: DC

## 2011-03-09 MED ORDER — BECLOMETHASONE DIPROPIONATE 40 MCG/ACT IN AERS: 2.0000 | INHALATION_SPRAY | Freq: Two times a day (BID) | RESPIRATORY_TRACT | Status: DC

## 2011-03-09 MED ORDER — HYDROCHLOROTHIAZIDE 25 MG PO TABS: 25.0000 mg | ORAL_TABLET | Freq: Every day | ORAL | Status: DC

## 2011-03-09 MED ORDER — TELMISARTAN 80 MG PO TABS: 80.0000 mg | ORAL_TABLET | Freq: Every day | ORAL | Status: DC

## 2011-03-09 MED ORDER — CARVEDILOL PHOSPHATE ER 10 MG PO CP24: 20.0000 mg | ORAL_CAPSULE | Freq: Every day | ORAL | Status: DC

## 2011-03-09 MED ORDER — ALPRAZOLAM 0.25 MG PO TABS: 0.2500 mg | ORAL_TABLET | Freq: Every evening | ORAL | Status: DC | PRN

## 2011-03-09 NOTE — Patient Instructions (Signed)
Drink plenty of fluids. Continue using the doxycycline and previous breathing medications. In addition use the Qvar

## 2011-03-09 NOTE — Progress Notes (Signed)
  Subjective:    Patient ID: Melissa Ferrell, female    DOB: 1935/07/17, 76 y.o.   MRN: [id number]  HPI Dr. Laray Anger the patient a few days ago and treated her with more doxycycline. She was doing okay yesterday and was able to go to Bible study. However today he she had a bad episode of breathing. She was very wheezy. She fell and she could not get her breath. That is when she decided to come back in here for a recheck. We talked about the need to consider calling 911 if it is too bad.    Review of Systems is not having any fever. This is just been a had a flare of her COPD which she's had troubles with the past. She is on chronic maintenance treatments, has been using the Proair inhaler about 4 times a day.      Objective:   Physical Exam168/86  She looks comfortable at this time. The wheezing has subsided. Throat was clear. Neck supple without nodes. No carotid bruits. Chest has poor air exchange as always, however no rhonchi or rales were audible. Heart was regular without murmurs. Ankles are about the same as usual.      Assessment & Plan:  COPD with exacerbation

## 2011-04-11 ENCOUNTER — Other Ambulatory Visit: Payer: Self-pay | Admitting: Family Medicine

## 2011-08-23 ENCOUNTER — Encounter: Payer: Self-pay | Admitting: Cardiology

## 2011-08-23 ENCOUNTER — Ambulatory Visit (INDEPENDENT_AMBULATORY_CARE_PROVIDER_SITE_OTHER): Payer: Medicare Other | Admitting: Cardiology

## 2011-08-23 VITALS — BP 134/76 | HR 68 | Ht 64.5 in | Wt 198.4 lb

## 2011-08-23 DIAGNOSIS — R0989 Other specified symptoms and signs involving the circulatory and respiratory systems: Secondary | ICD-10-CM

## 2011-08-23 DIAGNOSIS — I6529 Occlusion and stenosis of unspecified carotid artery: Secondary | ICD-10-CM | POA: Diagnosis not present

## 2011-08-23 DIAGNOSIS — I10 Essential (primary) hypertension: Secondary | ICD-10-CM

## 2011-08-23 DIAGNOSIS — R0600 Dyspnea, unspecified: Secondary | ICD-10-CM

## 2011-08-23 DIAGNOSIS — E785 Hyperlipidemia, unspecified: Secondary | ICD-10-CM

## 2011-08-23 DIAGNOSIS — R0602 Shortness of breath: Secondary | ICD-10-CM

## 2011-08-23 DIAGNOSIS — R0609 Other forms of dyspnea: Secondary | ICD-10-CM | POA: Diagnosis not present

## 2011-08-23 NOTE — Assessment & Plan Note (Signed)
Blood pressure controlled. Continue present medications. 

## 2011-08-23 NOTE — Assessment & Plan Note (Signed)
Managed by primary care. 

## 2011-08-23 NOTE — Assessment & Plan Note (Signed)
Plan to continue aspirin.check carotid Dopplers.

## 2011-08-23 NOTE — Patient Instructions (Addendum)
Your physician wants you to follow-up in: ONE YEAR WITH DR CRENSHAW You will receive a reminder letter in the mail two months in advance. If you don't receive a letter, please call our office to schedule the follow-up appointment.   Your physician has requested that you have a carotid duplex. This test is an ultrasound of the carotid arteries in your neck. It looks at blood flow through these arteries that supply the brain with blood. Allow one hour for this exam. There are no restrictions or special instructions.   Your physician has requested that you have an echocardiogram. Echocardiography is a painless test that uses sound waves to create images of your heart. It provides your doctor with information about the size and shape of your heart and how well your heart's chambers and valves are working. This procedure takes approximately one hour. There are no restrictions for this procedure.   

## 2011-08-23 NOTE — Assessment & Plan Note (Addendum)
I think this is most likely related to COPD. Repeat echocardiogram for LV function. She appears to be euvolemic on examination. Continue present dose of HCTZ.

## 2011-08-23 NOTE — Progress Notes (Signed)
HPI: Pleasant female for fu of congestive heart failure and hypertension. Echocardiogram in May of 2011 showed normal LV function, mild left atrial enlargement and grade 1 diastolic dysfunction. There was mild pulmonary hypertension. Patient had a Myoview in May of 2011. Perfusion was normal. The study was not gated. We felt that her dyspnea was most likely secondary to COPD. I last saw her in August of 2012. Since then, she continues to have dyspnea on exertion which is unchanged. There is no chest pain. No orthopnea or PND she does have chronic mild pedal edema.  Current Outpatient Prescriptions  Medication Sig Dispense Refill  . ALPRAZolam (XANAX) 0.25 MG tablet Take 1 tablet (0.25 mg total) by mouth at bedtime as needed.  30 tablet  5  . aspirin 81 MG tablet Take 81 mg by mouth 2 (two) times daily.        . beclomethasone (QVAR) 40 MCG/ACT inhaler Inhale 2 puffs into the lungs 2 (two) times daily.  1 Inhaler  5  . Calcium Carbonate-Vitamin D 600-400 MG-UNIT per tablet Take 1 tablet by mouth daily.        . carvedilol (COREG CR) 20 MG 24 hr capsule Take 20 mg by mouth daily.      . DHA-EPA-Flaxseed Oil-Vitamin E (THERA TEARS NUTRITION PO) Take by mouth daily.        Marland Kitchen doxycycline (VIBRAMYCIN) 100 MG capsule Take 100 mg by mouth as needed.        Marland Kitchen EPINEPHrine (EPIPEN IJ) Inject as directed as needed.        . Fish Oil OIL 1 tablet by Does not apply route daily.        . Flax OIL Take by mouth daily.        . folic acid (FOLVITE) 1 MG tablet Take 1 mg by mouth daily.        . hydrochlorothiazide (HYDRODIURIL) 25 MG tablet Take 1 tablet (25 mg total) by mouth daily.  90 tablet  3  . Lecithin 1200 MG CAPS Take 1,200 mg by mouth daily.      . Magnesium 250 MG TABS Take by mouth daily.        . Multiple Vitamins-Minerals (CENTRUM SILVER PO) Take 1 tablet by mouth daily.        Marland Kitchen PROAIR HFA 108 (90 BASE) MCG/ACT inhaler INHALE 2 PUFFS FOUR TIMES DAILY AS NEEDED  1 Inhaler  1  . telmisartan  (MICARDIS) 80 MG tablet Take 1 tablet (80 mg total) by mouth daily.  30 tablet  5  . tiotropium (SPIRIVA) 18 MCG inhalation capsule Place 1 capsule (18 mcg total) into inhaler and inhale daily. Use as directed  30 capsule  5  . vitamin B-12 (CYANOCOBALAMIN) 500 MCG tablet Take 500 mcg by mouth daily.        Marland Kitchen DISCONTD: albuterol (PROVENTIL) (2.5 MG/3ML) 0.083% nebulizer solution Take 2.5 mg by nebulization every 6 (six) hours as needed.           Past Medical History  Diagnosis Date  . HTN (hypertension)   . Hyperlipidemia   . IBS (irritable bowel syndrome)   . PUD (peptic ulcer disease)     secondary to NSAID-1975  . Depression   . Scarlet fever   . Emphysema   . Osteoporosis   . GERD (gastroesophageal reflux disease)   . Hx of adenomatous colonic polyps   . Anxiety   . Hemorrhoids   . Migraine headache   . Rheumatoid  arthritis   . Diastolic dysfunction   . History of colonoscopy     Past Surgical History  Procedure Date  . Thyroglossal duct cyst     1943, 1944, 1973  . Chronically infected lymph node removed 1973    History   Social History  . Marital Status: Divorced    Spouse Name: N/A    Number of Children: 3  . Years of Education: N/A   Occupational History  . Retired-homemaker    Social History Main Topics  . Smoking status: Former Games developer  . Smokeless tobacco: Not on file   Comment: previous 80 pack year history  . Alcohol Use: Yes     occasional  . Drug Use: Not on file  . Sexually Active: Not on file   Other Topics Concern  . Not on file   Social History Narrative  . No narrative on file    ROS: no fevers or chills, productive cough, hemoptysis, dysphasia, odynophagia, melena, hematochezia, dysuria, hematuria, rash, seizure activity, orthopnea, PND, pedal edema, claudication. Remaining systems are negative.  Physical Exam: Well-developed well-nourished in no acute distress.  Skin is warm and dry.  HEENT is normal.  Neck is supple.  Chest  diminished breath sounds throughout Cardiovascular exam is regular rate and rhythm.  Abdominal exam nontender or distended. No masses palpated. Extremities show trace edema. neuro grossly intact  ECG sinus rhythm at a rate of 68. No ST changes.

## 2011-08-30 ENCOUNTER — Ambulatory Visit (INDEPENDENT_AMBULATORY_CARE_PROVIDER_SITE_OTHER): Payer: Medicare Other | Admitting: Family

## 2011-08-30 ENCOUNTER — Encounter (INDEPENDENT_AMBULATORY_CARE_PROVIDER_SITE_OTHER): Payer: Medicare Other

## 2011-08-30 ENCOUNTER — Encounter: Payer: Self-pay | Admitting: Family

## 2011-08-30 VITALS — BP 160/94 | HR 81 | Temp 98.3°F | Ht 63.5 in | Wt 199.0 lb

## 2011-08-30 DIAGNOSIS — J449 Chronic obstructive pulmonary disease, unspecified: Secondary | ICD-10-CM | POA: Diagnosis not present

## 2011-08-30 DIAGNOSIS — F329 Major depressive disorder, single episode, unspecified: Secondary | ICD-10-CM | POA: Diagnosis not present

## 2011-08-30 DIAGNOSIS — E785 Hyperlipidemia, unspecified: Secondary | ICD-10-CM | POA: Diagnosis not present

## 2011-08-30 DIAGNOSIS — K589 Irritable bowel syndrome without diarrhea: Secondary | ICD-10-CM

## 2011-08-30 DIAGNOSIS — I6529 Occlusion and stenosis of unspecified carotid artery: Secondary | ICD-10-CM

## 2011-08-30 DIAGNOSIS — K219 Gastro-esophageal reflux disease without esophagitis: Secondary | ICD-10-CM

## 2011-08-30 DIAGNOSIS — E669 Obesity, unspecified: Secondary | ICD-10-CM

## 2011-08-30 LAB — LIPID PANEL
Cholesterol: 235 mg/dL — ABNORMAL HIGH (ref 0–200)
Total CHOL/HDL Ratio: 3
VLDL: 17.8 mg/dL (ref 0.0–40.0)

## 2011-08-30 LAB — CBC WITH DIFFERENTIAL/PLATELET
Eosinophils Relative: 2.1 % (ref 0.0–5.0)
HCT: 42.3 % (ref 36.0–46.0)
Hemoglobin: 14 g/dL (ref 12.0–15.0)
Lymphs Abs: 1.5 10*3/uL (ref 0.7–4.0)
Monocytes Relative: 7.1 % (ref 3.0–12.0)
Platelets: 238 10*3/uL (ref 150.0–400.0)
WBC: 6.2 10*3/uL (ref 4.5–10.5)

## 2011-08-30 LAB — BASIC METABOLIC PANEL
CO2: 24 mEq/L (ref 19–32)
Calcium: 9.7 mg/dL (ref 8.4–10.5)
Chloride: 102 mEq/L (ref 96–112)
Glucose, Bld: 91 mg/dL (ref 70–99)
Sodium: 137 mEq/L (ref 135–145)

## 2011-08-30 NOTE — Patient Instructions (Addendum)

## 2011-08-30 NOTE — Progress Notes (Signed)
Subjective:    Patient ID: Melissa Ferrell, female    DOB: Jun 07, 1935, 76 y.o.   MRN: [id number]  HPI 76 year old white female, nonsmoker, is in to be established. She has a history of Hypertension, Hyperlipidemia, COPD, Depression, and IBS. She is currently seeing cardiology who has suggest she have a PCP and a pulmonologist. She has a history of malignant hypertension and is seen for hypertension clinic at [facility]. Her home blood pressure readings are 118-140/56-70. Reports always had an elevated blood pressure reading in the office. She's currently stable her medications. She has not seen her PCP since February and has not had labs since October. Her last colonoscopy screening was in 1999, she's due for colonoscopy in 2009. She is declining to have a colonoscopy done at this time.   She has concerns of dementia because she's become more forgetful. Her mother had dementia. Denies getting lost or putting things in strange places. She also feels like she may be a lack of oxygen to her brain due to her lung dysfunction. She will like to begin a referral to pulmonology first.   Review of Systems  Constitutional: Negative.   HENT: Negative.   Eyes: Negative.   Respiratory: Positive for shortness of breath. Negative for cough and wheezing.   Cardiovascular: Negative.   Gastrointestinal: Negative.   Genitourinary: Negative.   Musculoskeletal: Negative.   Skin: Negative.   Neurological: Negative.   Hematological: Negative.   Psychiatric/Behavioral: Negative.    Past Medical History  Diagnosis Date  . HTN (hypertension)   . Hyperlipidemia   . IBS (irritable bowel syndrome)   . PUD (peptic ulcer disease)     secondary to NSAID-1975  . Depression   . Scarlet fever   . Emphysema   . Osteoporosis   . GERD (gastroesophageal reflux disease)   . Hx of adenomatous colonic polyps   . Anxiety   . Hemorrhoids   . Migraine headache   . Rheumatoid arthritis   . Diastolic dysfunction   .  History of colonoscopy     History   Social History  . Marital Status: Divorced    Spouse Name: N/A    Number of Children: 3  . Years of Education: N/A   Occupational History  . Retired-homemaker    Social History Main Topics  . Smoking status: Former Games developer  . Smokeless tobacco: Not on file   Comment: previous 80 pack year history  . Alcohol Use: Yes     occasional  . Drug Use: Not on file  . Sexually Active: Not on file   Other Topics Concern  . Not on file   Social History Narrative  . No narrative on file    Past Surgical History  Procedure Date  . Thyroglossal duct cyst     1943, 1944, 1973  . Chronically infected lymph node removed 1973    Family History  Problem Relation Age of Onset  . Heart disease Mother   . Hypertension Mother   . Stomach cancer      grandmother  . Melanoma Brother   . Colon cancer Brother   . Heart disease Brother   . Alcohol abuse    . Heart attack Brother 64    Allergies  Allergen Reactions  . Azithromycin   . Bee Venom   . Benzocaine   . Cefdinir   . Ciprofloxacin   . Diphenhydramine Hcl   . Ezetimibe   . Indomethacin   . Other  Adhesive tape  . Paba Derivatives   . Penicillins   . Statins     Current Outpatient Prescriptions on File Prior to Visit  Medication Sig Dispense Refill  . ALPRAZolam (XANAX) 0.25 MG tablet Take 1 tablet (0.25 mg total) by mouth at bedtime as needed.  30 tablet  5  . aspirin 81 MG tablet Take 81 mg by mouth 2 (two) times daily.        . Calcium Carbonate-Vitamin D 600-400 MG-UNIT per tablet Take 1 tablet by mouth daily.        . carvedilol (COREG CR) 20 MG 24 hr capsule Take 20 mg by mouth daily.      . DHA-EPA-Flaxseed Oil-Vitamin E (THERA TEARS NUTRITION PO) Take by mouth daily.        Marland Kitchen EPINEPHrine (EPIPEN IJ) Inject as directed as needed.        . folic acid (FOLVITE) 1 MG tablet Take 1 mg by mouth daily.        . hydrochlorothiazide (HYDRODIURIL) 25 MG tablet Take 1 tablet (25  mg total) by mouth daily.  90 tablet  3  . Lecithin 1200 MG CAPS Take 1,200 mg by mouth daily.      . Magnesium 250 MG TABS Take by mouth daily.        . Multiple Vitamins-Minerals (CENTRUM SILVER PO) Take 1 tablet by mouth daily.        Marland Kitchen PROAIR HFA 108 (90 BASE) MCG/ACT inhaler INHALE 2 PUFFS FOUR TIMES DAILY AS NEEDED  1 Inhaler  1  . telmisartan (MICARDIS) 80 MG tablet Take 1 tablet (80 mg total) by mouth daily.  30 tablet  5  . tiotropium (SPIRIVA) 18 MCG inhalation capsule Place 1 capsule (18 mcg total) into inhaler and inhale daily. Use as directed  30 capsule  5  . vitamin B-12 (CYANOCOBALAMIN) 500 MCG tablet Take 500 mcg by mouth daily.          BP 160/94  Pulse 81  Temp 98.3 F (36.8 C) (Oral)  Ht 5' 3.5" (1.613 m)  Wt 199 lb (90.266 kg)  BMI 34.70 kg/m2  SpO2 93%chart    Objective:   Physical Exam  Constitutional: She is oriented to person, place, and time. She appears well-developed and well-nourished.  HENT:  Right Ear: External ear normal.  Left Ear: External ear normal.  Nose: Nose normal.  Mouth/Throat: Oropharynx is clear and moist.  Eyes: Conjunctivae are normal. Pupils are equal, round, and reactive to light.  Neck: Normal range of motion. Neck supple.  Cardiovascular: Normal rate, regular rhythm and normal heart sounds.   Pulmonary/Chest: Effort normal and breath sounds normal.  Abdominal: Soft. Bowel sounds are normal.  Musculoskeletal: Normal range of motion. She exhibits edema.       2+ pitting edema  Neurological: She is alert and oriented to person, place, and time.  Skin: Skin is warm and dry.  Psychiatric: She has a normal mood and affect.          Assessment & Plan:  Assessment: COPD, Hypertension, Hyperlipidemia, Depression, IBS  Plan: Labs sent to include TSH, BMP, CBC, lipids. Continue the same meds. 3-4 month Recheck. Refer to Pulmonology.

## 2011-09-01 ENCOUNTER — Inpatient Hospital Stay (HOSPITAL_COMMUNITY): Admission: RE | Admit: 2011-09-01 | Payer: Medicare Other | Source: Ambulatory Visit

## 2011-09-02 ENCOUNTER — Encounter: Payer: Self-pay | Admitting: Internal Medicine

## 2011-09-02 ENCOUNTER — Ambulatory Visit (INDEPENDENT_AMBULATORY_CARE_PROVIDER_SITE_OTHER): Payer: Medicare Other | Admitting: Internal Medicine

## 2011-09-02 VITALS — BP 132/76 | HR 74 | Temp 98.3°F | Ht 60.0 in | Wt 199.2 lb

## 2011-09-02 DIAGNOSIS — J449 Chronic obstructive pulmonary disease, unspecified: Secondary | ICD-10-CM

## 2011-09-02 DIAGNOSIS — J438 Other emphysema: Secondary | ICD-10-CM

## 2011-09-02 DIAGNOSIS — J4489 Other specified chronic obstructive pulmonary disease: Secondary | ICD-10-CM

## 2011-09-02 DIAGNOSIS — J40 Bronchitis, not specified as acute or chronic: Secondary | ICD-10-CM | POA: Diagnosis not present

## 2011-09-02 DIAGNOSIS — I10 Essential (primary) hypertension: Secondary | ICD-10-CM | POA: Diagnosis not present

## 2011-09-02 NOTE — Progress Notes (Signed)
  Subjective:    Patient ID: Melissa Ferrell, female    DOB: [birth year]  MRN: [id number]  HPI  76yowf quit smoking early 90's with freq "bronchitis"  ? "I Don't know if got better" assoc with doe up inclines at that point but  much worse since 2010 but denied more freq or severe bronchitis assoc with with worsening doe and referred  09/02/11 to pulmonary clinic by Marybelle Killings.  09/02/2011 1st pulmonary ov cc doe best days can walk a mile at a slow pace  using spiriva daily but finds needs proair three time a day due to tightness on a daily basis.  She did have one bad episode of her "bronchitis" this past winter but denies any excess sputum production or increase need for saba or worse ex tol assoc with the "bronchitis"  And failed to ever describe her "bronchitis" episodes with anything approaching a symptom using non-specific terms like congestion/ sinus/ allergies/ draingage/ tightness and was quite insistent on the term bronchitis because that's what she's always been told it is.  Interestingly has no early am or sleep disturbance assoc with this "symptom".  Presently sleeping ok without nocturnal  or early am exacerbation  of respiratory  c/o's or need for noct saba. Also denies any obvious fluctuation of symptoms with weather or environmental changes or other aggravating or alleviating factors except as outlined above.  Review of Systems  Constitutional: Negative.   HENT: Positive for nosebleeds.   Eyes: Negative.   Respiratory: Positive for chest tightness and shortness of breath.   Cardiovascular: Positive for leg swelling.  Gastrointestinal: Negative.   Genitourinary: Negative.   Musculoskeletal: Positive for back pain.       -Hand and feet swelling -DDD -Rheumatoid Arth  Skin: Negative.   Neurological: Negative.   Hematological: Negative.   Psychiatric/Behavioral: The patient is nervous/anxious.        Objective:   Physical Exam Wt Readings from Last 3 Encounters:    09/02/11 199 lb 3.2 oz (90.357 kg)  08/30/11 199 lb (90.266 kg)  08/23/11 198 lb 6.4 oz (89.994 kg)    HEENT mild turbinate edema.  Oropharynx no thrush or excess pnd or cobblestoning.  No JVD or cervical adenopathy. Mild accessory muscle hypertrophy. Trachea midline, nl thryroid. Chest was hyperinflated by percussion with diminished breath sounds and moderate increased exp time without wheeze. Hoover sign positive at mid inspiration. Regular rate and rhythm without murmur gallop or rub or increase P2 or edema.  Abd: no hsm, nl excursion. Ext warm without cyanosis or clubbing.    cxr not on file     Assessment & Plan:

## 2011-09-02 NOTE — Patient Instructions (Addendum)
Due to your copd/? Asthma that you feel needs proaire to help your breathing Strongly prefer in this setting: Bystolic, the most beta -1  selective Beta blocker available in sample form, with bisoprolol the most selective generic choice  on the market.   I also recommend you return for PFT's and I would be happy to see you for any flare of breathing or coughing but I will be asking you to try changes in your medications if you develop worse symptoms if you want me to be responsible for your care and this will include potentially changing your blood pressure medicaitons and use of oil based vitamins  I also strongly recommend you be careful with reflux issues:  GERD (REFLUX)  is an extremely common cause of respiratory symptoms (looks like your bronchitis/ emphysema) , many times with no significant heartburn at all.    It can be treated with medication, but also with lifestyle changes including avoidance of late meals, excessive alcohol, smoking cessation, and avoid fatty foods, chocolate, peppermint, colas, red wine, and acidic juices such as orange juice.  NO MINT OR MENTHOL PRODUCTS SO NO COUGH DROPS  USE SUGARLESS CANDY INSTEAD (jolley ranchers or Stover's)  NO OIL BASED VITAMINS - use powdered substitutes.

## 2011-09-03 DIAGNOSIS — J40 Bronchitis, not specified as acute or chronic: Secondary | ICD-10-CM | POA: Insufficient documentation

## 2011-09-03 NOTE — Assessment & Plan Note (Signed)
After 15 minutes of circular discussion with this patient, I offered to see her back for what she is calling "severe bronchitis" and would caution everyone treating her not to over treat with abx > I wouldn't use any in the absence of a change in sputum volume or color here as this term is non specific, usually the scenario is virus induces cough which induces reflux which induces more cough

## 2011-09-03 NOTE — Assessment & Plan Note (Signed)
Strongly prefer in this setting: Bystolic, the most beta -1  selective Beta blocker available in sample form, with bisoprolol the most selective generic choice  on the market.   She was highly resistant to any change in her meds for now but I asked her to keep an open mind in the future should her resp symptoms worsen

## 2011-09-03 NOTE — Assessment & Plan Note (Signed)
  When respiratory symptoms begin or become refractory well after a patient reports complete smoking cessation,  Especially when this wasn't the case while they were smoking, a red flag is raised based on the work of Dr Primitivo Gauze which states:  if you quit smoking when your best day FEV1 is still well preserved it is highly unlikely you will progress to severe disease.  That is to say, once the smoking stops,  the symptoms should not suddenly erupt or markedly worsen.  If so, the differential diagnosis should include  obesity/deconditioning,  LPR/Reflux/Aspiration syndromes,  occult CHF, or  especially side effect of medications commonly used in this population, in her case non-specific beta blockers (Coreg)  I note also she feels dependent on excessive saba and then learned her hfa technique is not adequate so not really sure she's getting any, but spiriva is the best choice for her anyway for now because it actually is the treatment for Betablocker induced bronchospasm and the pattern of copd she likely has = emphysema.  For now rec she get pft's and consider change bp meds (discussed separately)

## 2011-09-06 ENCOUNTER — Other Ambulatory Visit: Payer: Self-pay

## 2011-09-06 ENCOUNTER — Ambulatory Visit (HOSPITAL_COMMUNITY): Payer: Medicare Other | Attending: Cardiology | Admitting: Radiology

## 2011-09-06 DIAGNOSIS — E785 Hyperlipidemia, unspecified: Secondary | ICD-10-CM | POA: Diagnosis not present

## 2011-09-06 DIAGNOSIS — Z87891 Personal history of nicotine dependence: Secondary | ICD-10-CM | POA: Insufficient documentation

## 2011-09-06 DIAGNOSIS — I079 Rheumatic tricuspid valve disease, unspecified: Secondary | ICD-10-CM | POA: Diagnosis not present

## 2011-09-06 DIAGNOSIS — I10 Essential (primary) hypertension: Secondary | ICD-10-CM | POA: Insufficient documentation

## 2011-09-06 DIAGNOSIS — R0600 Dyspnea, unspecified: Secondary | ICD-10-CM

## 2011-09-06 DIAGNOSIS — I509 Heart failure, unspecified: Secondary | ICD-10-CM | POA: Diagnosis not present

## 2011-09-06 DIAGNOSIS — J438 Other emphysema: Secondary | ICD-10-CM | POA: Diagnosis not present

## 2011-09-06 NOTE — Progress Notes (Signed)
Echocardiogram performed.  

## 2011-09-09 ENCOUNTER — Telehealth: Payer: Self-pay | Admitting: *Deleted

## 2011-09-09 MED ORDER — NEBIVOLOL HCL 5 MG PO TABS: 5.0000 mg | ORAL_TABLET | Freq: Every day | ORAL | Status: DC

## 2011-09-09 NOTE — Telephone Encounter (Signed)
Spoke with pt, dr wert's office note reviewed by dr Jens Som, pt to change from carvedilol to bystolic 5 mg once daily. She will track her bp and let us know if trends high.

## 2011-09-12 ENCOUNTER — Ambulatory Visit (INDEPENDENT_AMBULATORY_CARE_PROVIDER_SITE_OTHER): Payer: Medicare Other | Admitting: Internal Medicine

## 2011-09-12 DIAGNOSIS — J449 Chronic obstructive pulmonary disease, unspecified: Secondary | ICD-10-CM

## 2011-09-12 LAB — PULMONARY FUNCTION TEST

## 2011-09-12 NOTE — Progress Notes (Signed)
PFT done today. 

## 2011-09-13 ENCOUNTER — Encounter: Payer: Self-pay | Admitting: Internal Medicine

## 2011-09-15 ENCOUNTER — Telehealth: Payer: Self-pay | Admitting: Internal Medicine

## 2011-09-15 NOTE — Telephone Encounter (Signed)
I spoke with pt and she is requesting her PFT results from 09/12/11. She is fine with a call back tomorrow. Please advise Dr. Sherene Sires thanks

## 2011-09-16 NOTE — Telephone Encounter (Signed)
Mild airflow obstruction, no need to change meds, be sure she has f/u ov with me scheduled in next 6 weeks to discuss specifics of longterm management

## 2011-09-16 NOTE — Telephone Encounter (Signed)
Apologized for not seeing her on the day she had the pft's because that's the normal way we do things but ascertained that she's not happy with the evaluation that was done and is very pessimistic about her future lung function  However, she only has GOLD II copd and should do fine off non-specific beta blockers but if flares occur we'd be happy to see her in the clinic on an as needed basis.  Alternatively, she can be referred to another pulmonary doctor of her choosing.  Sandrea Hughs, MD Pulmonary and Critical Care Medicine [facility] Cell [phone]

## 2011-09-16 NOTE — Telephone Encounter (Signed)
Pt is calling for her PFT results. MW please advise. Thanks

## 2011-09-20 ENCOUNTER — Telehealth: Payer: Self-pay | Admitting: Family

## 2011-09-20 NOTE — Telephone Encounter (Signed)
Caller: Alexxa/Patient; Patient Name: Melissa Ferrell; PCP: Adline [facility]); Best Callback Phone Number: [phone]; Patient calling regarding unhappiness with [facility] Pulminologist, Dr. Sherene Sires. Patient teary on the phone. States that pulmonary function tests were done on 09/12/11 and she called twice for results; Did not get a call back until Friday afternoon. States Dr. Sherene Sires would not give her any other diagnosis than "Fairly Mild, told her she'd be just fine, and laughed at her other questions". Patient is asking if Dr. Orvan Falconer can interpret results and discuss with her. Patients requests a call back with MD advice. Best call back number is [phone]. Triage declined.

## 2011-09-21 ENCOUNTER — Encounter: Payer: Self-pay | Admitting: Internal Medicine

## 2011-09-22 NOTE — Telephone Encounter (Signed)
LMTCB

## 2011-09-22 NOTE — Telephone Encounter (Signed)
Per Oran Rein, notify pt that she has mild COPD and there is nothing to do right now except for her to use the inhalers.  Pt notified and was very unsatisfied with conversation with Dr. Sherene Sires. She states that she doesn't think the results were actually read. I offered the pt a referral to a different pulmonologist and she declined stating that we will just send her back to the same office, and that she just wants someone to read the results because she knows that her COPD is more that "minor." I explained to the pt that she was sent to pulmonology because they specialize in lung function, therefore Padonda would not be able to tell her anything that Dr. Sherene Sires didn't tell her. Pt stated that she doesn't see Padonda until November and that is bronchitis and flu season and if she gets sick on the weekend and needs an abx who is she suppose to see. Pt now aware that  has Saturday clinic every Saturday at the [facility] location from 9am-1pm and that she could see Oran Rein before November is she needs to. Pt verbalized understanding and thankful the help and clarification that she received. Pt advised to with any other questions or concerns

## 2011-09-24 DIAGNOSIS — Z23 Encounter for immunization: Secondary | ICD-10-CM | POA: Diagnosis not present

## 2011-09-27 DIAGNOSIS — Z1231 Encounter for screening mammogram for malignant neoplasm of breast: Secondary | ICD-10-CM | POA: Diagnosis not present

## 2011-09-30 ENCOUNTER — Encounter: Payer: Self-pay | Admitting: Internal Medicine

## 2011-09-30 ENCOUNTER — Ambulatory Visit (INDEPENDENT_AMBULATORY_CARE_PROVIDER_SITE_OTHER): Payer: Medicare Other | Admitting: Internal Medicine

## 2011-09-30 VITALS — BP 142/98 | Temp 98.1°F | Wt 199.0 lb

## 2011-09-30 DIAGNOSIS — R109 Unspecified abdominal pain: Secondary | ICD-10-CM | POA: Diagnosis not present

## 2011-09-30 DIAGNOSIS — R350 Frequency of micturition: Secondary | ICD-10-CM | POA: Diagnosis not present

## 2011-09-30 DIAGNOSIS — J449 Chronic obstructive pulmonary disease, unspecified: Secondary | ICD-10-CM

## 2011-09-30 DIAGNOSIS — J4489 Other specified chronic obstructive pulmonary disease: Secondary | ICD-10-CM

## 2011-09-30 DIAGNOSIS — I2510 Atherosclerotic heart disease of native coronary artery without angina pectoris: Secondary | ICD-10-CM

## 2011-09-30 LAB — POCT URINALYSIS DIPSTICK
Glucose, UA: NEGATIVE
Nitrite, UA: NEGATIVE
Protein, UA: NEGATIVE
Urobilinogen, UA: 0.2
pH, UA: 6.5

## 2011-09-30 MED ORDER — CYCLOBENZAPRINE HCL 5 MG PO TABS: 5.0000 mg | ORAL_TABLET | Freq: Three times a day (TID) | ORAL | Status: DC | PRN

## 2011-09-30 MED ORDER — TRIMETHOPRIM 100 MG PO TABS: 100.0000 mg | ORAL_TABLET | Freq: Two times a day (BID) | ORAL | Status: AC

## 2011-09-30 NOTE — Progress Notes (Signed)
Subjective:    Patient ID: Melissa Ferrell, female    DOB: 1935/01/27, 76 y.o.   MRN: [id number]  HPI  76 year old patient who is seen today for followup. She is seen today as a work in the 2 right lumbar back pain. She states that she has a history of cervical DJD as well as a prior lumbar compression fracture. For the past 5 days she has had pain maximal in the right lumbar area that is aggravated by movement. Denies any history of kidney stones urinary frequency fever dysuria. Urinalysis was reviewed and did reveal moderate pyuria and trace hematuria. She has treated hypertension and a history of coronary artery disease. Due to pulmonary concerns Coreg has been discontinued and Bystolic substituted. She has had recent pulmonary function studies and a 2-D echocardiogram. Her cardiopulmonary status appears to be stable She has multiple sensitivities to medications.  Past Medical History  Diagnosis Date  . HTN (hypertension)   . Hyperlipidemia   . IBS (irritable bowel syndrome)   . PUD (peptic ulcer disease)     secondary to NSAID-1975  . Depression   . Scarlet fever   . Emphysema   . Osteoporosis   . GERD (gastroesophageal reflux disease)   . Hx of adenomatous colonic polyps   . Anxiety   . Hemorrhoids   . Migraine headache   . Rheumatoid arthritis   . Diastolic dysfunction   . History of colonoscopy   . DDD (degenerative disc disease)   . Fracture, spinal     History   Social History  . Marital Status: Divorced    Spouse Name: N/A    Number of Children: 3  . Years of Education: N/A   Occupational History  . Retired-homemaker    Social History Main Topics  . Smoking status: Former Games developer  . Smokeless tobacco: Not on file   Comment: previous 80 pack year history  . Alcohol Use: Yes     occasional  . Drug Use: Not on file  . Sexually Active: Not on file   Other Topics Concern  . Not on file   Social History Narrative  . No narrative on file    Past Surgical  History  Procedure Date  . Thyroglossal duct cyst     1943, 1944, 1973  . Chronically infected lymph node removed 1973    Family History  Problem Relation Age of Onset  . Heart disease Mother     MI  . Hypertension Mother   . Stomach cancer Brother   . Melanoma Brother   . Colon cancer Brother   . Heart disease Brother   . Alcohol abuse    . Heart attack Brother 30  . Allergies Sister   . Breast cancer Maternal Grandmother   . Brain cancer Father     Allergies  Allergen Reactions  . Azithromycin   . Bee Venom   . Benzocaine   . Cefdinir   . Ciprofloxacin   . Diphenhydramine Hcl   . Ezetimibe   . Indomethacin   . Other     Adhesive tape  . Paba Derivatives   . Penicillins   . Statins     Current Outpatient Prescriptions on File Prior to Visit  Medication Sig Dispense Refill  . ALPRAZolam (XANAX) 0.25 MG tablet Take 1 tablet (0.25 mg total) by mouth at bedtime as needed.  30 tablet  5  . aspirin 81 MG tablet Take 81 mg by mouth 2 (two) times daily.        Marland Kitchen  Calcium Carbonate-Vitamin D 600-400 MG-UNIT per tablet Take 1 tablet by mouth daily.        . DHA-EPA-Flaxseed Oil-Vitamin E (THERA TEARS NUTRITION PO) Take by mouth daily.        Marland Kitchen EPINEPHrine (EPIPEN IJ) Inject as directed as needed.        . Flaxseed, Linseed, (FLAXSEED OIL) 1000 MG CAPS Take by mouth.      . folic acid (FOLVITE) 1 MG tablet Take 1 mg by mouth daily.        . hydrochlorothiazide (HYDRODIURIL) 25 MG tablet Take 1 tablet (25 mg total) by mouth daily.  90 tablet  3  . Lecithin 1200 MG CAPS Take 1,200 mg by mouth daily.      . Magnesium 250 MG TABS Take by mouth daily.        . Multiple Vitamins-Minerals (CENTRUM SILVER PO) Take 1 tablet by mouth daily.        . Multiple Vitamins-Minerals (OCUVITE EYE HEALTH FORMULA PO) Take 1 tablet by mouth daily.      . nebivolol (BYSTOLIC) 5 MG tablet Take 1 tablet (5 mg total) by mouth daily.  30 tablet  12  . Omega-3 Fatty Acids (FISH OIL) 1200 MG CAPS  Take by mouth.      Marland Kitchen PROAIR HFA 108 (90 BASE) MCG/ACT inhaler INHALE 2 PUFFS FOUR TIMES DAILY AS NEEDED  1 Inhaler  1  . telmisartan (MICARDIS) 80 MG tablet Take 1 tablet (80 mg total) by mouth daily.  30 tablet  5  . tiotropium (SPIRIVA) 18 MCG inhalation capsule Place 1 capsule (18 mcg total) into inhaler and inhale daily. Use as directed  30 capsule  5  . vitamin B-12 (CYANOCOBALAMIN) 500 MCG tablet Take 500 mcg by mouth daily.          BP 142/98  Temp 98.1 F (36.7 C) (Oral)  Wt 199 lb (90.266 kg)       Review of Systems  Constitutional: Negative.   HENT: Negative for hearing loss, congestion, sore throat, rhinorrhea, dental problem, sinus pressure and tinnitus.   Eyes: Negative for pain, discharge and visual disturbance.  Respiratory: Negative for cough and shortness of breath.   Cardiovascular: Negative for chest pain, palpitations and leg swelling.  Gastrointestinal: Negative for nausea, vomiting, abdominal pain, diarrhea, constipation, blood in stool and abdominal distention.  Genitourinary: Negative for dysuria, urgency, frequency, hematuria, flank pain, vaginal bleeding, vaginal discharge, difficulty urinating, vaginal pain and pelvic pain.  Musculoskeletal: Positive for back pain. Negative for joint swelling, arthralgias and gait problem.  Skin: Negative for rash.  Neurological: Negative for dizziness, syncope, speech difficulty, weakness, numbness and headaches.  Hematological: Negative for adenopathy.  Psychiatric/Behavioral: Negative for behavioral problems, dysphoric mood and agitation. The patient is not nervous/anxious.        Objective:   Physical Exam  Constitutional: She appears well-developed and well-nourished. No distress.  Musculoskeletal:       The right lumbar musculature was quite tight and tense Straight leg testing on the right did tend to aggravate the lumbar discomfort. No radicular pain Reflexes intact Motor exam normal            Assessment & Plan:   Right lumbar pain. Appears to be musculoligamentous. Will treat with a muscle relaxer heat rest and also Depo-Medrol. She will call if unimproved next week Hypertension. Repeat blood pressure 130/90 we'll continue present regimen COPD stable Coronary artery disease Pyuria. Will treat with  Trimpex  for 7 days

## 2011-09-30 NOTE — Patient Instructions (Signed)
You  may move around, but avoid painful motions and activities.  Apply heat  to the sore area for 15 to 20 minutes 3 or 4 times daily for the next two to 3 days.Back Pain, Adult Back pain is very common. The pain often gets better over time. The cause of back pain is usually not dangerous. Most people can learn to manage their back pain on their own.   HOME CARE    Stay active. Start with short walks on flat ground if you can. Try to walk farther each day.   Do not sit, drive, or stand in one place for more than 30 minutes. Do not stay in bed.   Do not avoid exercise or work. Activity can help your back heal faster.   Be careful when you bend or lift an object. Bend at your knees, keep the object close to you, and do not twist.   Sleep on a firm mattress. Lie on your side, and bend your knees. If you lie on your back, put a pillow under your knees.   Only take medicines as told by your doctor.   Put ice on the injured area.   Put ice in a plastic bag.   Place a towel between your skin and the bag.   Leave the ice on for 15 to 20 minutes, 3 to 4 times a day for the first 2 to 3 days. After that, you can switch between ice and heat packs.   Ask your doctor about back exercises or massage.   Avoid feeling anxious or stressed. Find good ways to deal with stress, such as exercise.  GET HELP RIGHT AWAY IF:    Your pain does not go away with rest or medicine.   Your pain does not go away in 1 week.   You have new problems.   You do not feel well.   The pain spreads into your legs.   You cannot control when you poop (bowel movement) or pee (urinate).   Your arms or legs feel weak or lose feeling (numbness).   You feel sick to your stomach (nauseous) or throw up (vomit).   You have belly (abdominal) pain.   You feel like you may pass out (faint).  MAKE SURE YOU:    Understand these instructions.   Will watch your condition.   Will get help right away if you are not doing  well or get worse.  Document Released: 06/22/2007 Document Revised: 12/23/2010 Document Reviewed: 05/24/2010 [facility] Patient Information 2012 [place], [place].Back Injury Prevention Back injuries are extremely painful, difficult to heal, and have an effect on everything you do. After suffering one back injury, you are much more likely to experience another later on. It is important to learn how to avoid injuring or re-injuring your back. You can learn proper lifting techniques and the basics of back safety, saving yourself a lot of pain and a lifetime of back problems.   WHY DO BACK INJURIES OCCUR?  The lower part of the back holds most of the body's weight.   Every time you bend over, lift a heavy object, or sit leaning forward, you put stress on your spine.   Over time, the discs between your vertebrae can start to wear out and become damaged.   Repetitive bending and lifting can quickly cause back problems.   Even leaning forward while sitting at a desk or table can eventually cause damage and pain.  The following are contributing factors,  and related tips to prevent back injury. POOR PHYSICAL CONDITION, EXTRA WEIGHT, AND INACTIVITY  Your stomach muscles provide a lot of the support needed by your back.   If you have weak stomach muscles, your back may not get all the support it needs, especially when you are lifting or carrying heavy objects.   Good general physical condition is important for preventing strains, sprains, and other injuries. Exercise regularly and try to develop good tone in your abdominal (stomach) muscles.   The more you weigh, the more stress is placed on your back every time you bend over, at a ratio of 10:1. For every pound of weight, 10 times that amount of pressure is placed on the back.   Regular aerobic exercise (walking, jogging, biking, swimming) has been shown to decrease back injuries.   Exercises that increase balance and strength can decrease your risk  of falling and injuring your back or breaking bones. Exercises such as tai chi and yoga, or any weight-bearing exercise that challenges your balance, are good for increasing balance and strength.   Stretching and strengthening exercises can also reduce the risk of injuries.   Maintaining your ideal body weight is also important for having a healthy back.  DIET  In order to keep your spine strong, you will need to get enough calcium and vitamin D in your diet, to help prevent osteoporosis (bones becoming full of pores and weak, with age or diet deficiency).   Osteoporosis is responsible for many bone fractures that lead to back pain.   Calcium can be found in dairy products, green, leafy vegetables, and products with calcium added (fortified), like some orange juices.   Although your skin makes vitamin D when you are in the sun, you can also get it from your diet.   Vitamin D is found in milk and foods that have this vitamin added (fortified).   Many adults do not get enough calcium and vitamin D in their diet.   You should talk to your caregiver about how much calcium and vitamin D you need per day. Consider taking a nutritional supplement or a multivitamin.  POOR POSTURE  Sit and stand up straight.   It is best to try to maintain the back in its natural, slight "S" shaped curve.   Avoid leaning forward (unsupported) when you sit, or hunching over while you are standing.   A chair with good lumbar (low back) support helps protect the back when you sit.   If you work at a desk, sit close to your work so you do not need to lean over. Keep your chin tucked in. Keep your neck drawn back and elbows bent at 90 degrees to your spine.   Sit high and close to the steering wheel when you drive. Add a lumbar support to your car seat, if needed.   Avoid sitting or standing too long in 1 position. Sitting can be hard on the lower back. Take breaks, get up, stretch and walk around frequently (at  least every hour).   Avoid sleeping in an unnatural position. Sleep on your side (with knees slightly bent), or on your back (with a pillow under your knees). Do not sleep on your stomach.  OVEREXERTION AND SUDDEN TWISTS OR MOVEMENTS  Avoid working in odd, uncomfortable positions, such as when gardening, kneeling, or doing tasks that require you to bend over for long periods of time.   Strech before exerting: If you know you will be doing work  that might stress your back, take the time to stretch and loosen your muscles before starting, just like an athlete before a workout. This will help you avoid painful strains and sprains.   Slow down: If you are doing a lot of heavy, repetitive lifting, work slowly. Allow yourself more recovery time between lifts. Over working your back will cost you more time later, when you need medical attention or when you find every movement painful.   It is important to recognize your physical limitations and abilities.   Do not hesitate to say, "This is too heavy for me to lift alone."   Many people have injured their backs because they were afraid to ask for help. Ask for help!   Certain actions, motions and movements are more likely than others to cause or contribute to back injuries.   Avoid heavy lifting, especially repetitive lifting over a long period of time.   Avoid twisting at the waist, while lifting or holding a heavy load.   Avoid reaching and lifting over your head, across a table, or for an object on an elevated surface. Do not lean forward and lift heavy objects that are far from your body.   Concentrate on keeping your shoulders pulled back and your low back straight.   Never bend over without bending your knees.   Bend at your knees, instead of your back, when you pick up objects. Instead of using your back like a crane, let your legs do the work.   Try not to lift heavy things higher than your waist, or reach for objects on shelves above  your head.   When lifting:   Take a balanced stance, with your feet about shoulder width apart. One foot can be behind the object and the other next to it.   Squat down to lift the object, but keep your heels off the floor.   Get as close to the object to be lifted as you can.   Lift gradually, without jerking, using (tightening) your leg, abdominal and buttock muscles. Keep the load as close to you as possible.   Keep your back straight.   Keep your chin tucked in, to maintain a relatively straight back and neck line.   Once you are standing, change directions by pointing your feet in the direction you want to go and turning your whole body. Avoid twisting at your waist while carrying a load.   When you put a load down, use these same guidelines in reverse.   Reduce the amount of weight lifted. If you're moving books, it is better to load several small boxes rather than 1 heavy box.   Use handles and lifting straps.   Do not turn or twist while holding an object. Turn at your feet, not your back.   Avoid lifting or carrying objects with awkward or odd shapes. Get help if the shape is too awkward for you to lift and move by yourself.   The use of wide elastic belts, that can be worn and tightened to "pull in" lumbar and abdominal muscles, to prevent low back pain, is controversial.  STRESS  Tense muscles are more vulnerable to strains and spasms.   Mental stress that you experience is directed to your muscles, neck, and back.   Find constructive ways, including exercise and other relaxation techniques, to decrease the stress you experience and decrease its impact on your body.   Massage can help reduce the stress built up in your muscles  and back.  ENVIRONMENTAL CAUSES AND SOLUTIONS  You could injure your back from slipping on a wet floor or ice. Avoid wet and newly mopped surfaces, and make sure that ice around your home and office walkways is removed or treated.   Sleeping  on a mattress that is too soft or too hard can hurt your back. If too soft, consider inserting a large plywood board between your mattress and box spring, or replacing your mattress. Mattresses and box springs should be replaced every 10 to 15 years, depending on the product.   Place, store, and position objects up off the floor. That way, you will not need to reach down to pick them up again.   Raise or lower shelves, so you do not need to reach, or twist your back, neck, and shoulders.   Put heavier objects on shelves at waist level, and lighter objects on lower or higher shelves.   Use carts and dollies to move objects, rather than carrying them yourself.   It is better to push a cart, dolly, lawnmower, or wheelbarrow, than it is to pull it. If you do need to pull it, force yourself to tighten your stomach muscles and try to maintain good body posture.   Use cranes, hoists, a lift table, or other lift-assist devices whenever you can.  Your caregiver can provide additional information about preventing back (and other injuries) when you seek care, to avoid a first or recurrent back injury. If you injure your back, visit your caregiver so you can be assessed and treated.   Document Released: 02/11/2004 Document Revised: 12/23/2010 Document Reviewed: 11/03/2008 [facility] Patient Information 2012 [place], [place].

## 2011-10-05 ENCOUNTER — Other Ambulatory Visit: Payer: Self-pay

## 2011-10-05 MED ORDER — ALPRAZOLAM 0.25 MG PO TABS: 0.2500 mg | ORAL_TABLET | Freq: Every evening | ORAL | Status: DC | PRN

## 2011-10-06 ENCOUNTER — Encounter: Payer: Self-pay | Admitting: Family

## 2011-10-11 ENCOUNTER — Other Ambulatory Visit: Payer: Self-pay

## 2011-10-11 MED ORDER — TIOTROPIUM BROMIDE MONOHYDRATE 18 MCG IN CAPS: 18.0000 ug | ORAL_CAPSULE | Freq: Every day | RESPIRATORY_TRACT | Status: DC

## 2011-10-21 ENCOUNTER — Other Ambulatory Visit: Payer: Self-pay

## 2011-10-21 MED ORDER — TELMISARTAN 80 MG PO TABS: 80.0000 mg | ORAL_TABLET | Freq: Every day | ORAL | Status: DC

## 2011-10-28 ENCOUNTER — Telehealth: Payer: Self-pay | Admitting: Internal Medicine

## 2011-10-28 ENCOUNTER — Encounter: Payer: Self-pay | Admitting: Family

## 2011-10-28 ENCOUNTER — Ambulatory Visit (INDEPENDENT_AMBULATORY_CARE_PROVIDER_SITE_OTHER): Payer: Medicare Other | Admitting: Family

## 2011-10-28 VITALS — HR 77 | Wt 197.0 lb

## 2011-10-28 DIAGNOSIS — J449 Chronic obstructive pulmonary disease, unspecified: Secondary | ICD-10-CM | POA: Diagnosis not present

## 2011-10-28 DIAGNOSIS — I10 Essential (primary) hypertension: Secondary | ICD-10-CM

## 2011-10-28 DIAGNOSIS — E876 Hypokalemia: Secondary | ICD-10-CM

## 2011-10-28 DIAGNOSIS — F411 Generalized anxiety disorder: Secondary | ICD-10-CM | POA: Diagnosis not present

## 2011-10-28 DIAGNOSIS — N390 Urinary tract infection, site not specified: Secondary | ICD-10-CM

## 2011-10-28 DIAGNOSIS — F419 Anxiety disorder, unspecified: Secondary | ICD-10-CM

## 2011-10-28 LAB — BASIC METABOLIC PANEL WITH GFR
BUN: 16 mg/dL (ref 6–23)
CO2: 29 meq/L (ref 19–32)
Calcium: 9.7 mg/dL (ref 8.4–10.5)
Chloride: 103 meq/L (ref 96–112)
Creatinine, Ser: 1 mg/dL (ref 0.4–1.2)
GFR: 57.56 mL/min — ABNORMAL LOW
Glucose, Bld: 90 mg/dL (ref 70–99)
Potassium: 4.2 meq/L (ref 3.5–5.1)
Sodium: 141 meq/L (ref 135–145)

## 2011-10-28 LAB — POCT URINALYSIS DIPSTICK
Blood, UA: NEGATIVE
Glucose, UA: NEGATIVE
Nitrite, UA: NEGATIVE
Protein, UA: NEGATIVE
Spec Grav, UA: 1.01
Urobilinogen, UA: 0.2

## 2011-10-28 LAB — LIPID PANEL
Cholesterol: 217 mg/dL — ABNORMAL HIGH (ref 0–200)
HDL: 60.7 mg/dL
Total CHOL/HDL Ratio: 4
Triglycerides: 75 mg/dL (ref 0.0–149.0)
VLDL: 15 mg/dL (ref 0.0–40.0)

## 2011-10-28 NOTE — Telephone Encounter (Signed)
Fine with me - she likes to use medical terms like bronchitis without defining what she means by using them so getting a history is problematic

## 2011-10-28 NOTE — Patient Instructions (Addendum)
The patient is instructed to continue all medications as prescribed. Schedule followup with check out clerk upon leaving the clinic  

## 2011-10-28 NOTE — Telephone Encounter (Signed)
Please advise [facility] if you are with accepting her as new pt. thanks

## 2011-10-28 NOTE — Telephone Encounter (Signed)
I spoke with pt and she stated she did wants to switch from [facility] to [facility]. She stated she wants a doctor that is caring and will listen to her complaints. Pt aware of our office protocol. Please advise [facility] thanks

## 2011-10-31 ENCOUNTER — Encounter: Payer: Self-pay | Admitting: Family

## 2011-10-31 NOTE — Telephone Encounter (Signed)
Pt aware and appt made for 11-29-11. Carron Curie, CMA

## 2011-10-31 NOTE — Progress Notes (Signed)
Subjective:    Patient ID: Melissa Ferrell, female    DOB: 1935-12-15, 76 y.o.   MRN: [id number]  HPI  76 year old white female, nonsmoker is in today for recheck of mild COPD, hypertension, hyperlipidemia. Her cholesterol was been elevated for many years but has been unable to tolerate any statin medications were that ear. She continues to be concerned about having COPD. She was diagnosed with mild COPD by pulmonology. She's currently stable her medications. The patient continues to feel as though her shortness of breath is not well managed. She denies any lightheadedness, dizziness, chest pain, palpitations or edema. He had a pulmonary function test that were consistent with COPD-mild.  Review of Systems  Constitutional: Negative.   HENT: Negative.   Eyes: Negative.   Respiratory: Negative.   Cardiovascular: Negative.   Gastrointestinal: Negative.   Genitourinary: Negative.   Musculoskeletal: Negative.   Skin: Negative.   Neurological: Negative.   Hematological: Negative.   Psychiatric/Behavioral: Negative.    Past Medical History  Diagnosis Date  . HTN (hypertension)   . Hyperlipidemia   . IBS (irritable bowel syndrome)   . PUD (peptic ulcer disease)     secondary to NSAID-1975  . Depression   . Scarlet fever   . Emphysema   . Osteoporosis   . GERD (gastroesophageal reflux disease)   . Hx of adenomatous colonic polyps   . Anxiety   . Hemorrhoids   . Migraine headache   . Rheumatoid arthritis   . Diastolic dysfunction   . History of colonoscopy   . DDD (degenerative disc disease)   . Fracture, spinal     History   Social History  . Marital Status: Divorced    Spouse Name: N/A    Number of Children: 3  . Years of Education: N/A   Occupational History  . Retired-homemaker    Social History Main Topics  . Smoking status: Former Games developer  . Smokeless tobacco: Not on file   Comment: previous 80 pack year history  . Alcohol Use: Yes     occasional  . Drug Use:  Not on file  . Sexually Active: Not on file   Other Topics Concern  . Not on file   Social History Narrative  . No narrative on file    Past Surgical History  Procedure Date  . Thyroglossal duct cyst     1943, 1944, 1973  . Chronically infected lymph node removed 1973    Family History  Problem Relation Age of Onset  . Heart disease Mother     MI  . Hypertension Mother   . Stomach cancer Brother   . Melanoma Brother   . Colon cancer Brother   . Heart disease Brother   . Alcohol abuse    . Heart attack Brother 67  . Allergies Sister   . Breast cancer Maternal Grandmother   . Brain cancer Father     Allergies  Allergen Reactions  . Azithromycin   . Bee Venom   . Benzocaine   . Cefdinir   . Ciprofloxacin   . Diphenhydramine Hcl   . Ezetimibe   . Indomethacin   . Other     Adhesive tape  . Paba Derivatives   . Penicillins   . Statins     Current Outpatient Prescriptions on File Prior to Visit  Medication Sig Dispense Refill  . ALPRAZolam (XANAX) 0.25 MG tablet Take 1 tablet (0.25 mg total) by mouth at bedtime as needed.  30 tablet  5  .  aspirin 81 MG tablet Take 81 mg by mouth 2 (two) times daily.        . Calcium Carbonate-Vitamin D 600-400 MG-UNIT per tablet Take 1 tablet by mouth daily.        . cyclobenzaprine (FLEXERIL) 5 MG tablet Take 1 tablet (5 mg total) by mouth every 8 (eight) hours as needed for muscle spasms.  30 tablet  1  . DHA-EPA-Flaxseed Oil-Vitamin E (THERA TEARS NUTRITION PO) Take by mouth daily.        Marland Kitchen EPINEPHrine (EPIPEN IJ) Inject as directed as needed.        . Flaxseed, Linseed, (FLAXSEED OIL) 1000 MG CAPS Take by mouth.      . folic acid (FOLVITE) 1 MG tablet Take 1 mg by mouth daily.        . hydrochlorothiazide (HYDRODIURIL) 25 MG tablet Take 1 tablet (25 mg total) by mouth daily.  90 tablet  3  . Lecithin 1200 MG CAPS Take 1,200 mg by mouth daily.      . Magnesium 250 MG TABS Take by mouth daily.        . Multiple  Vitamins-Minerals (CENTRUM SILVER PO) Take 1 tablet by mouth daily.        . Multiple Vitamins-Minerals (OCUVITE EYE HEALTH FORMULA PO) Take 1 tablet by mouth daily.      . nebivolol (BYSTOLIC) 5 MG tablet Take 1 tablet (5 mg total) by mouth daily.  30 tablet  12  . Omega-3 Fatty Acids (FISH OIL) 1200 MG CAPS Take by mouth.      Marland Kitchen PROAIR HFA 108 (90 BASE) MCG/ACT inhaler INHALE 2 PUFFS FOUR TIMES DAILY AS NEEDED  1 Inhaler  1  . telmisartan (MICARDIS) 80 MG tablet Take 1 tablet (80 mg total) by mouth daily.  30 tablet  5  . tiotropium (SPIRIVA) 18 MCG inhalation capsule Place 1 capsule (18 mcg total) into inhaler and inhale daily. Use as directed  30 capsule  5  . vitamin B-12 (CYANOCOBALAMIN) 500 MCG tablet Take 500 mcg by mouth daily.          Pulse 77  Wt 197 lb (89.359 kg)  SpO2 93%chart    Objective:   Physical Exam  Constitutional: She is oriented to person, place, and time. She appears well-developed and well-nourished.  HENT:  Head: Normocephalic and atraumatic.  Right Ear: External ear normal.  Left Ear: External ear normal.  Nose: Nose normal.  Mouth/Throat: Oropharynx is clear and moist.  Eyes: Conjunctivae normal and EOM are normal. Pupils are equal, round, and reactive to light.  Neck: Normal range of motion. Neck supple. No thyromegaly present.  Cardiovascular: Normal rate, regular rhythm and normal heart sounds.   Pulmonary/Chest: Effort normal and breath sounds normal.  Abdominal: Bowel sounds are normal.  Musculoskeletal: Normal range of motion.  Neurological: She is alert and oriented to person, place, and time.  Skin: Skin is warm and dry.  Psychiatric: She has a normal mood and affect.          Assessment & Plan:  Assessment: Hypertension, Hyperlipidemia, COPD-mild  Plan: Patient unable to tolerate Statins or Zetia and declining treatment. I believe her COPD is stable but she continues to be concerned. I have asked her to contact her pulmonologist for  further management. She has been dissatisfied with Dr. Sherene Sires but is willing to see another MD in the office.

## 2011-10-31 NOTE — Telephone Encounter (Signed)
Ok with me, but needs to be new consult/second opinion.

## 2011-11-15 DIAGNOSIS — H2510 Age-related nuclear cataract, unspecified eye: Secondary | ICD-10-CM | POA: Diagnosis not present

## 2011-11-15 DIAGNOSIS — H52209 Unspecified astigmatism, unspecified eye: Secondary | ICD-10-CM | POA: Diagnosis not present

## 2011-11-24 ENCOUNTER — Telehealth: Payer: Self-pay | Admitting: Family

## 2011-11-24 ENCOUNTER — Encounter: Payer: Self-pay | Admitting: Family Medicine

## 2011-11-24 ENCOUNTER — Ambulatory Visit (INDEPENDENT_AMBULATORY_CARE_PROVIDER_SITE_OTHER): Payer: Medicare Other | Admitting: Family Medicine

## 2011-11-24 VITALS — BP 150/90 | HR 79 | Temp 98.3°F | Wt 199.0 lb

## 2011-11-24 DIAGNOSIS — M545 Low back pain, unspecified: Secondary | ICD-10-CM

## 2011-11-24 LAB — POCT URINALYSIS DIPSTICK
Bilirubin, UA: NEGATIVE
Blood, UA: NEGATIVE
Ketones, UA: NEGATIVE
Protein, UA: NEGATIVE
pH, UA: 6.5

## 2011-11-24 NOTE — Telephone Encounter (Signed)
Caller: Tahja/Patient; Patient Name: Melissa Ferrell; PCP: Adline Mango [facility]); Best Callback Phone Number: [phone], mild - moderate back pain, onset 11/23/11, no injury, able to urinate, feels like it did with previous UTI, but not having urinary burning Back Sx Protocol Utilized.   11/24/11 0945 appt scheduled.  Pt voiced understanding.

## 2011-11-24 NOTE — Progress Notes (Signed)
  Subjective:    Patient ID: Melissa Ferrell, female    DOB: 16-Mar-1935, 76 y.o.   MRN: [id number]  HPI Here for 2 days of pain in the left lower back. She was seen here for a similar pain in the right lower back 2 months ago. She was found to have a UTI at that time and was given Trimethiprim. Now she denies any urinary burning or urgency. No fever or nausea.    Review of Systems  Constitutional: Negative.   Gastrointestinal: Negative.   Genitourinary: Negative.   Musculoskeletal: Positive for back pain.       Objective:   Physical Exam  Constitutional: She appears well-developed and well-nourished.  Abdominal: Soft. Bowel sounds are normal. She exhibits no distension and no mass. There is no tenderness. There is no rebound and no guarding.  Musculoskeletal:       Tender on the left lower back with slight spasm           Assessment & Plan:  This is muscular back pain. She can use Advil and heat prn. Her UA today is clear but she is still very concerned that she may have a UTI. Therefore we will culture the sample.

## 2011-11-25 ENCOUNTER — Telehealth: Payer: Self-pay | Admitting: Family Medicine

## 2011-11-25 NOTE — Telephone Encounter (Signed)
I left voice message for pt. We were going to to order a urine culture, however the sample was discarded. The UA dip was clear but if pt is still having symptoms, then she can call us back and then come by only to give a sample. I left a detailed message with this information.

## 2011-11-29 ENCOUNTER — Ambulatory Visit (INDEPENDENT_AMBULATORY_CARE_PROVIDER_SITE_OTHER): Payer: Medicare Other | Admitting: Pulmonary Disease

## 2011-11-29 ENCOUNTER — Encounter: Payer: Self-pay | Admitting: Pulmonary Disease

## 2011-11-29 VITALS — BP 144/82 | HR 72 | Temp 97.6°F | Ht 65.0 in | Wt 197.0 lb

## 2011-11-29 DIAGNOSIS — J449 Chronic obstructive pulmonary disease, unspecified: Secondary | ICD-10-CM | POA: Diagnosis not present

## 2011-11-29 NOTE — Patient Instructions (Addendum)
Stay on spiriva once a day, and use albuterol for rescue only. Will add arcapta one inhalation each am to see if we can reduce your albuterol use Work on weight loss and conditioning.  Please call me in 2-3 weeks with update on how things are going, and see me formally in 4 mos.

## 2011-11-29 NOTE — Progress Notes (Signed)
  Subjective:    Patient ID: Melissa Ferrell, female    DOB: 06/13/1935, 76 y.o.   MRN: [id number]  HPI The patient is a 76 year old female who comes in today for evaluation and management of COPD.  She has had recent PFTs that showed at least moderate disease, with significant air trapping.  She has been managed on Spiriva alone with as needed albuterol for many years, but this past winter has had episodes of "bronchitis".  She has been treated with antibiotics but not prednisone and finally returned to her usual baseline.  She is very active, and is able to walk a mile before she gets winded, but feels that she is having to use albuterol frequently for shortness of breath.  She will not get winded bringing groceries in from the car or doing light housework.  She denies any chronic cough or mucus, and is not having any wheezing currently.  She states that her weight is up about 10 pounds over the last one year.   Review of Systems  Constitutional: Positive for unexpected weight change. Negative for fever.  HENT: Negative for ear pain, nosebleeds, congestion, sore throat, rhinorrhea, sneezing, trouble swallowing, dental problem, postnasal drip and sinus pressure.   Eyes: Negative for redness and itching.  Respiratory: Negative for cough, chest tightness, shortness of breath and wheezing.   Cardiovascular: Positive for leg swelling. Negative for palpitations.  Gastrointestinal: Negative for nausea and vomiting.  Genitourinary: Negative for dysuria.  Musculoskeletal: Positive for joint swelling.  Skin: Negative for rash.  Neurological: Positive for headaches ( due to meds ).  Hematological: Bruises/bleeds easily.  Psychiatric/Behavioral: Negative for dysphoric mood. The patient is nervous/anxious.        Objective:   Physical Exam Constitutional:  Obese female, no acute distress  HENT:  Nares patent without discharge but narrowed  Oropharynx without exudate, palate and uvula are  normal  Eyes:  Perrla, eomi, no scleral icterus  Neck:  No JVD, no TMG  Cardiovascular:  Normal rate, regular rhythm, no rubs or gallops.  No murmurs        Intact distal pulses  Pulmonary :  Normal breath sounds, no stridor or respiratory distress   No rales, rhonchi, or wheezing  Abdominal:  Soft, nondistended, bowel sounds present.  No tenderness noted.   Musculoskeletal:  1+ lower extremity edema noted.  Lymph Nodes:  No cervical lymphadenopathy noted  Skin:  No cyanosis noted  Neurologic:  Alert, appropriate, moves all 4 extremities without obvious deficit.         Assessment & Plan:

## 2011-11-29 NOTE — Assessment & Plan Note (Signed)
The patient has at least moderate COPD along with significant air trapping, and despite staying on Spiriva compliantly he is still overusing albuterol.  I would like to add a LABA in the form of arcapta, to see if she will decrease her albuterol usage and see an improvement in her exertional tolerance.  I have stressed to her the importance of aggressive weight loss and some type of conditioning program.  Will consider a referral to pulmonary rehabilitation at the next visit.

## 2011-11-30 ENCOUNTER — Other Ambulatory Visit: Payer: Self-pay | Admitting: *Deleted

## 2011-11-30 ENCOUNTER — Ambulatory Visit: Payer: Medicare Other | Admitting: Family

## 2011-12-08 ENCOUNTER — Telehealth: Payer: Self-pay | Admitting: Pulmonary Disease

## 2011-12-08 ENCOUNTER — Telehealth: Payer: Self-pay | Admitting: Cardiology

## 2011-12-08 MED ORDER — CARVEDILOL 6.25 MG PO TABS: 6.2500 mg | ORAL_TABLET | Freq: Two times a day (BID) | ORAL | Status: DC

## 2011-12-08 MED ORDER — INDACATEROL MALEATE 75 MCG IN CAPS: 1.0000 | ORAL_CAPSULE | Freq: Every day | RESPIRATORY_TRACT | Status: DC

## 2011-12-08 NOTE — Telephone Encounter (Signed)
Spoke with pt, she was unable to tolerate bystolic and on her own has switched back to coreg. Dr Shelle Iron is okay for her to cont the coreg but she needs the generic form. She is currently on coreg cr 20 mg once daily. Carvedilol 6.25 mg twice daily called to pharm.

## 2011-12-08 NOTE — Telephone Encounter (Signed)
plz return call to pt [phone] to discuss medication.

## 2011-12-08 NOTE — Telephone Encounter (Signed)
Last OV on 11-29-11 pt was started on arcapta, and advised to continue spiriva.  Pt states this was to see if she could decrease her usage of proair and increase her exertional tolerance. Pt states she was using her proair three times a day at last OV. Since starting the arcapta she has only used the proair twice. Pt states she has also noticed an improvement in her SOB with exertion as well. Pt is asking for rx be sent to pharmacy. Rx has been sent since pt has improved. I will forward message to [facility] so he is aware pt is doing well. Carron Curie, CMA

## 2011-12-08 NOTE — Telephone Encounter (Signed)
Pt will be out of town when she runs out of the sample she was given, so she would like to get the Rx sent in so her pharmacy can get this delivered before she goes out of town on Wednesday, 12/14/11.  Antionette Fairy

## 2011-12-22 DIAGNOSIS — L821 Other seborrheic keratosis: Secondary | ICD-10-CM | POA: Diagnosis not present

## 2011-12-22 DIAGNOSIS — D1801 Hemangioma of skin and subcutaneous tissue: Secondary | ICD-10-CM | POA: Diagnosis not present

## 2011-12-22 DIAGNOSIS — L570 Actinic keratosis: Secondary | ICD-10-CM | POA: Diagnosis not present

## 2012-02-02 ENCOUNTER — Emergency Department (HOSPITAL_COMMUNITY)
Admission: EM | Admit: 2012-02-02 | Discharge: 2012-02-02 | Disposition: A | Discharge status: Home / Self Care | Payer: Medicare Other | Attending: Emergency Medicine | Admitting: Emergency Medicine

## 2012-02-02 ENCOUNTER — Encounter (HOSPITAL_COMMUNITY): Payer: Self-pay

## 2012-02-02 DIAGNOSIS — Z8679 Personal history of other diseases of the circulatory system: Secondary | ICD-10-CM | POA: Insufficient documentation

## 2012-02-02 DIAGNOSIS — Z79899 Other long term (current) drug therapy: Secondary | ICD-10-CM | POA: Insufficient documentation

## 2012-02-02 DIAGNOSIS — Z8739 Personal history of other diseases of the musculoskeletal system and connective tissue: Secondary | ICD-10-CM | POA: Diagnosis not present

## 2012-02-02 DIAGNOSIS — Z87891 Personal history of nicotine dependence: Secondary | ICD-10-CM | POA: Diagnosis not present

## 2012-02-02 DIAGNOSIS — Z9889 Other specified postprocedural states: Secondary | ICD-10-CM | POA: Insufficient documentation

## 2012-02-02 DIAGNOSIS — Z8711 Personal history of peptic ulcer disease: Secondary | ICD-10-CM | POA: Insufficient documentation

## 2012-02-02 DIAGNOSIS — Z860100 Personal history of colon polyps, unspecified: Secondary | ICD-10-CM | POA: Insufficient documentation

## 2012-02-02 DIAGNOSIS — I83893 Varicose veins of bilateral lower extremities with other complications: Secondary | ICD-10-CM | POA: Diagnosis not present

## 2012-02-02 DIAGNOSIS — Y939 Activity, unspecified: Secondary | ICD-10-CM | POA: Insufficient documentation

## 2012-02-02 DIAGNOSIS — F3289 Other specified depressive episodes: Secondary | ICD-10-CM | POA: Insufficient documentation

## 2012-02-02 DIAGNOSIS — X58XXXA Exposure to other specified factors, initial encounter: Secondary | ICD-10-CM | POA: Insufficient documentation

## 2012-02-02 DIAGNOSIS — S91309A Unspecified open wound, unspecified foot, initial encounter: Secondary | ICD-10-CM | POA: Diagnosis not present

## 2012-02-02 DIAGNOSIS — Y929 Unspecified place or not applicable: Secondary | ICD-10-CM | POA: Insufficient documentation

## 2012-02-02 DIAGNOSIS — F329 Major depressive disorder, single episode, unspecified: Secondary | ICD-10-CM | POA: Diagnosis not present

## 2012-02-02 DIAGNOSIS — F411 Generalized anxiety disorder: Secondary | ICD-10-CM | POA: Insufficient documentation

## 2012-02-02 DIAGNOSIS — Z8709 Personal history of other diseases of the respiratory system: Secondary | ICD-10-CM | POA: Diagnosis not present

## 2012-02-02 DIAGNOSIS — Z7982 Long term (current) use of aspirin: Secondary | ICD-10-CM | POA: Diagnosis not present

## 2012-02-02 DIAGNOSIS — Z8781 Personal history of (healed) traumatic fracture: Secondary | ICD-10-CM | POA: Diagnosis not present

## 2012-02-02 DIAGNOSIS — Z8719 Personal history of other diseases of the digestive system: Secondary | ICD-10-CM | POA: Insufficient documentation

## 2012-02-02 DIAGNOSIS — I83899 Varicose veins of unspecified lower extremities with other complications: Secondary | ICD-10-CM

## 2012-02-02 NOTE — ED Notes (Signed)
BJY:[id number]<FA> Expected date:02/02/12<BR> Expected time:10:34 AM<BR> Means of arrival:Ambulance<BR> Comments:<BR> ems

## 2012-02-02 NOTE — ED Provider Notes (Signed)
History     CSN: [id number]  Arrival date & time 02/02/12  1055   First MD Initiated Contact with Patient 02/02/12 1115      Chief Complaint  Patient presents with  . Foot Injury     The history is provided by the patient.   patient reports noting something abnormal on the dorsum of her right lateral foot which she picked with her fingernail resulting in significant bleeding a varicose vein at home.  She was unable to control the bleeding the she presents emergency department for evaluation.  On arrival of emergency department the bleeding has stopped on its own.  The patient has no other complaints.  She denies use of anticoagulants besides an 81 mg aspirin.  Past Medical History  Diagnosis Date  . HTN (hypertension)   . Hyperlipidemia   . IBS (irritable bowel syndrome)   . PUD (peptic ulcer disease)     secondary to NSAID-1975  . Depression   . Scarlet fever   . Emphysema   . Osteoporosis   . GERD (gastroesophageal reflux disease)   . Hx of adenomatous colonic polyps   . Anxiety   . Hemorrhoids   . Migraine headache   . Rheumatoid arthritis   . Diastolic dysfunction   . History of colonoscopy   . DDD (degenerative disc disease)   . Fracture, spinal     Past Surgical History  Procedure Date  . Thyroglossal duct cyst     1943, 1944, 1973  . Chronically infected lymph node removed 1973    Family History  Problem Relation Age of Onset  . Heart disease Mother     MI  . Hypertension Mother   . Stomach cancer Brother   . Melanoma Brother   . Colon cancer Brother   . Heart disease Brother   . Alcohol abuse    . Heart attack Brother 37  . Allergies Sister   . Breast cancer Maternal Grandmother   . Brain cancer Father     History  Substance Use Topics  . Smoking status: Former Smoker -- 3.0 packs/day for 30 years    Types: Cigarettes    Quit date: 01/17/1985  . Smokeless tobacco: Never Used     Comment: previous 80 pack year history  . Alcohol Use: No     OB History    Grav Para Term Preterm Abortions TAB SAB Ect Mult Living                  Review of Systems  All other systems reviewed and are negative.    Allergies  Azithromycin; Bee venom; Benzocaine; Cefdinir; Ciprofloxacin; Diphenhydramine hcl; Indomethacin; Lasix; Other; Paba derivatives; Penicillins; Prednisone; Statins; and Zetia  Home Medications   Current Outpatient Rx  Name  Route  Sig  Dispense  Refill  . ALBUTEROL SULFATE HFA 108 (90 BASE) MCG/ACT IN AERS   Inhalation   Inhale 2 puffs into the lungs every 6 (six) hours as needed. Shortness of breath         . ALPRAZOLAM 0.25 MG PO TABS   Oral   Take 0.25 mg by mouth at bedtime as needed. Anxiety/sleep         . ASPIRIN 81 MG PO TABS   Oral   Take 81 mg by mouth 2 (two) times daily.          Marland Kitchen CALCIUM CARBONATE-VITAMIN D 600-400 MG-UNIT PO TABS   Oral   Take 1 tablet by mouth 2 (  two) times daily.          Marland Kitchen CARVEDILOL 6.25 MG PO TABS   Oral   Take 1 tablet (6.25 mg total) by mouth 2 (two) times daily.   180 tablet   3   . FLAXSEED OIL 1000 MG PO CAPS   Oral   Take 1 capsule by mouth daily at 12 noon.          Marland Kitchen FOLIC ACID 1 MG PO TABS   Oral   Take 1 mg by mouth daily at 12 noon.          Marland Kitchen HYDROCHLOROTHIAZIDE 25 MG PO TABS   Oral   Take 25 mg by mouth every morning.         Marland Kitchen HYPROMELLOSE 2.5 % OP SOLN   Both Eyes   Place 1 drop into both eyes daily as needed. Dry eyes         . INDACATEROL MALEATE 75 MCG IN CAPS   Inhalation   Place 1 capsule into inhaler and inhale daily. Uses right after spiriva         . LECITHIN 1200 MG PO CAPS   Oral   Take 1,200 mg by mouth every morning.          Marland Kitchen MAGNESIUM 250 MG PO TABS   Oral   Take 1 tablet by mouth every morning.          . CENTRUM SILVER PO   Oral   Take 1 tablet by mouth every morning.          Idolina Primer EYE HEALTH FORMULA PO   Oral   Take 1 tablet by mouth daily at 12 noon.          Marland Kitchen FISH OIL 1200  MG PO CAPS   Oral   Take 1 capsule by mouth daily at 12 noon.          Marland Kitchen TELMISARTAN 80 MG PO TABS   Oral   Take 80 mg by mouth every morning.         Marland Kitchen TIOTROPIUM BROMIDE MONOHYDRATE 18 MCG IN CAPS   Inhalation   Place 1 capsule (18 mcg total) into inhaler and inhale daily. Use as directed   30 capsule   5   . VITAMIN B-12 500 MCG PO TABS   Oral   Take 500 mcg by mouth every morning.            BP 101/55  Pulse 66  Temp 98 F (36.7 C) (Oral)  Resp 16  SpO2 97%  Physical Exam  Nursing note and vitals reviewed. Constitutional: She is oriented to person, place, and time. She appears well-developed and well-nourished.  HENT:  Head: Normocephalic.  Eyes: EOM are normal.  Neck: Normal range of motion.  Pulmonary/Chest: Effort normal.  Abdominal: She exhibits no distension.  Musculoskeletal: Normal range of motion.       Small area on her right lateral foot with evidence of recent bleeding but now no active bleeding.  There is a scab present over a varicose vein  Neurological: She is alert and oriented to person, place, and time.  Psychiatric: She has a normal mood and affect.    ED Course  Procedures (including critical care time)  LACERATION REPAIR Performed by: Lyanne Co Consent: Verbal consent obtained. Risks and benefits: risks, benefits and alternatives were discussed Patient identity confirmed: provided demographic data Time out performed prior to procedure Laceration Location: left lateral foot on  dorsum Laceration Length: 0.5cm No Foreign Bodies seen or palpated Anesthesia: none Amount of cleaning: alcohol skin scrub Skin closure: Dermabond  Number of sutures or staples: Dermabond  Patient tolerance: Patient tolerated the procedure well with no immediate complications.   Labs Reviewed - No data to display No results found.   1. Bleeding from varicose vein       MDM  Patient with recent bleeding varicose vein.  The bleeding is  stopped.  A small amount of Dermabond was placed over the top of the existing scab so this is left-sided this to decrease the chance of recurrent bleeding.        Lyanne Co, MD 02/02/12 1235

## 2012-02-02 NOTE — ED Notes (Signed)
Per EMS- [place] area of [address]. Patient reports that she flicked something off of her right foot and her foot began bleeding profusely. EMS reports approximately 300-500 ml of blood in the bathroom

## 2012-02-06 ENCOUNTER — Telehealth: Payer: Self-pay | Admitting: Cardiology

## 2012-02-06 NOTE — Telephone Encounter (Signed)
[id number] pt having low BP 115/54 at 600a, doesn't feel good

## 2012-02-06 NOTE — Telephone Encounter (Signed)
Continue same meds and monitor BP for now Melissa Ferrell

## 2012-02-06 NOTE — Telephone Encounter (Signed)
Spoke with pt. Pt had blood loss from bleeding varicose vein on the top of her foot last Thursday, treated at [facility] ED.  Since that time her blood pressure had been low. BP Saturday was 102/54. BP this morning was 115/54 and BP now is 134/75 and pulse was 80.

## 2012-02-06 NOTE — Telephone Encounter (Signed)
Pt is asking if her BP medication should be adjusted. I advised pt to stay well hydrated and to keep a record of  her BP.I will forward this information to Dr Jens Som for review.

## 2012-02-07 NOTE — Telephone Encounter (Signed)
Spoke with pt, Aware of dr crenshaw's recommendations.  °

## 2012-02-15 ENCOUNTER — Encounter: Payer: Self-pay | Admitting: Family

## 2012-02-17 ENCOUNTER — Ambulatory Visit (INDEPENDENT_AMBULATORY_CARE_PROVIDER_SITE_OTHER): Payer: Medicare Other | Admitting: Family

## 2012-02-17 ENCOUNTER — Encounter: Payer: Self-pay | Admitting: Family

## 2012-02-17 VITALS — BP 150/100 | HR 74 | Wt 198.0 lb

## 2012-02-17 DIAGNOSIS — I10 Essential (primary) hypertension: Secondary | ICD-10-CM

## 2012-02-17 DIAGNOSIS — I8390 Asymptomatic varicose veins of unspecified lower extremity: Secondary | ICD-10-CM

## 2012-02-17 DIAGNOSIS — J449 Chronic obstructive pulmonary disease, unspecified: Secondary | ICD-10-CM | POA: Diagnosis not present

## 2012-02-17 NOTE — Patient Instructions (Addendum)
Bleeding Varicose Veins  Varicose veins are veins that have become enlarged and twisted. Valves in the veins help return blood from the leg to the heart. If these valves are damaged, blood flows backwards and backs up into the veins in the leg near the skin. This causes the veins to become larger because of increased pressure within. Sometimes these veins bleed.  CAUSES   Factors that can lead to bleeding varicose veins include:   Thinning of the skin that covers the veins. This skin is stretched as the veins enlarge.   Weak and thinning walls of the varicose veins. These thin walls are part of the reason why blood is not flowing normally to the heart.   Having high pressure in the veins. This high pressure occurs because the blood is not flowing freely back up to the heart.   Injury. Even a small injury to a varicose vein can cause bleeding.   Open wounds. A sore may develop near a varicose vein and not heal. This makes bleeding more likely.   Taking medicine that thins the blood. These medicines may include aspirin, anti-inflammatory medicine, and other blood thinners.  SYMPTOMS   If bleeding is on the outside surface of the skin, blood can be seen. Sometimes, the bleeding stays under the skin. If this happens, the blue or purple area will spread beyond the vein. This discoloration may be visible.  DIAGNOSIS   To decide if you have a bleeding varicose vein, your caregiver may:   Ask about your symptoms. This will include when you first saw bleeding.   Ask about how long you have had varicose veins and if they cause you problems.   Ask about your overall health.   Ask about possible causes, like recent cuts or if the area near the varicose veins was bumped or injured.   Examine the skin or leg that concerns you. Your caregiver will probably feel the veins.   Order imaging tests. These create detailed pictures of the veins.  TREATMENT   The first goal of treating bleeding varicose veins is to stop the  bleeding. Then, the aim is to keep any bleeding from happening again. Treatment will depend on the cause of the bleeding and how bad it is. Ask your caregiver about what would be best for you. Options include:   Raising (elevating) your leg. Lie down with your leg propped up on a pillow or cushion. Your foot should be above your heart.   Applying pressure to the spot that is bleeding. The bleeding should stop in a short time.   Wearing elastic stocking that "compress" your legs (compression stockings). An elastic bandage may do the same thing.   Applying an antibiotic cream on sores that are not healing.   Surgically removing or closing off the bleeding varicose veins.  HOME CARE INSTRUCTIONS    Apply any creams that your caregiver prescribed. Follow the directions carefully.   Wear compression stockings or any special wraps that were prescribed. Make sure you know:   If you should wear them every day.   How long you should wear them.   If veins were removed or closed, a bandage (dressing) will probably cover the area. Make sure you know:   How often the dressing should be changed.   Whether the area can get wet.   When you can leave the skin uncovered.   Check your skin every day. Look for new sores and signs of bleeding.   To   prevent future bleeding:   Use extra care in situations where you could cut your legs. Shaving, for example, or working outside in the garden.   Try to keep your legs elevated as much as possible. Lie down when you can.  SEEK MEDICAL CARE IF:    You have any questions about how to wear compression stockings or elastic bandages.   Your veins continue to bleed.   Sores develop near your varicose veins.   You have a sore that does not heal or gets bigger.   Pain increases in your leg.   The area around a varicose vein becomes warm, red, or tender to the touch.   You notice a yellowish fluid that smells bad coming from a spot where there was bleeding.   You develop a  fever of more than 100.5 F (38.1 C).  SEEK IMMEDIATE MEDICAL CARE IF:    You develop a fever of more than 102 F (38.9 C).  Document Released: 05/22/2008 Document Revised: 03/28/2011 Document Reviewed: 03/01/2010  [employer] Patient Information 2013 [employer]

## 2012-02-17 NOTE — Progress Notes (Signed)
Subjective:    Patient ID: Melissa Ferrell, female    DOB: 02-May-1935, 77 y.o.   MRN: [id number]  HPI 77 year old white female, nonsmoker is in for an emergency department followup. She was seen in the emergency department with a bleeding varicose pain to her right foot. Patient reports significant blood loss and difficulty controlling the bleeding. She applied pressure for about an hour in the bleeding eventually stopped. The physician in the emergency department reinforced with Dermabond and she was discharged. She has not had any bleeding since. Has never had an issue like this previously. Has a history of hypertension. Blood pressures at the assisted care facility are usually 120s to 140s systolically over 50s to 60s.   Review of Systems  Constitutional: Negative.   Respiratory: Negative.   Cardiovascular: Negative.   Musculoskeletal: Negative.   Skin: Positive for wound.       Healing varicose vein to the right foot  Hematological: Negative.   Psychiatric/Behavioral: Negative.    Past Medical History  Diagnosis Date  . HTN (hypertension)   . Hyperlipidemia   . IBS (irritable bowel syndrome)   . PUD (peptic ulcer disease)     secondary to NSAID-1975  . Depression   . Scarlet fever   . Emphysema   . Osteoporosis   . GERD (gastroesophageal reflux disease)   . Hx of adenomatous colonic polyps   . Anxiety   . Hemorrhoids   . Migraine headache   . Rheumatoid arthritis   . Diastolic dysfunction   . History of colonoscopy   . DDD (degenerative disc disease)   . Fracture, spinal     History   Social History  . Marital Status: Divorced    Spouse Name: N/A    Number of Children: 3  . Years of Education: N/A   Occupational History  . Retired-homemaker    Social History Main Topics  . Smoking status: Former Smoker -- 3.0 packs/day for 30 years    Types: Cigarettes    Quit date: 01/17/1985  . Smokeless tobacco: Never Used     Comment: previous 80 pack year history  .  Alcohol Use: No  . Drug Use: No  . Sexually Active: Not on file   Other Topics Concern  . Not on file   Social History Narrative  . No narrative on file    Past Surgical History  Procedure Date  . Thyroglossal duct cyst     1943, 1944, 1973  . Chronically infected lymph node removed 1973    Family History  Problem Relation Age of Onset  . Heart disease Mother     MI  . Hypertension Mother   . Stomach cancer Brother   . Melanoma Brother   . Colon cancer Brother   . Heart disease Brother   . Alcohol abuse    . Heart attack Brother 25  . Allergies Sister   . Breast cancer Maternal Grandmother   . Brain cancer Father     Allergies  Allergen Reactions  . Azithromycin   . Bee Venom   . Benzocaine   . Cefdinir   . Ciprofloxacin   . Diphenhydramine Hcl   . Indomethacin   . Lasix (Furosemide)     Low tolerance    . Other     Adhesive tape  . Paba Derivatives   . Penicillins   . Prednisone     Low tolerance   . Statins     Low tolerance    .  Zetia (Ezetimibe)     Low tolerance     Current Outpatient Prescriptions on File Prior to Visit  Medication Sig Dispense Refill  . albuterol (PROVENTIL HFA;VENTOLIN HFA) 108 (90 BASE) MCG/ACT inhaler Inhale 2 puffs into the lungs every 6 (six) hours as needed. Shortness of breath      . ALPRAZolam (XANAX) 0.25 MG tablet Take 0.25 mg by mouth at bedtime as needed. Anxiety/sleep      . aspirin 81 MG tablet Take 81 mg by mouth 2 (two) times daily.       . Calcium Carbonate-Vitamin D 600-400 MG-UNIT per tablet Take 1 tablet by mouth 2 (two) times daily.       . carvedilol (COREG) 6.25 MG tablet Take 1 tablet (6.25 mg total) by mouth 2 (two) times daily.  180 tablet  3  . Flaxseed, Linseed, (FLAXSEED OIL) 1000 MG CAPS Take 1 capsule by mouth daily at 12 noon.       . folic acid (FOLVITE) 1 MG tablet Take 1 mg by mouth daily at 12 noon.       . hydrochlorothiazide (HYDRODIURIL) 25 MG tablet Take 25 mg by mouth every morning.       . hydroxypropyl methylcellulose (ISOPTO TEARS) 2.5 % ophthalmic solution Place 1 drop into both eyes daily as needed. Dry eyes      . Indacaterol Maleate 75 MCG CAPS Place 1 capsule into inhaler and inhale daily. Uses right after spiriva      . Lecithin 1200 MG CAPS Take 1,200 mg by mouth every morning.       . Magnesium 250 MG TABS Take 1 tablet by mouth every morning.       . Multiple Vitamins-Minerals (CENTRUM SILVER PO) Take 1 tablet by mouth every morning.       . Multiple Vitamins-Minerals (OCUVITE EYE HEALTH FORMULA PO) Take 1 tablet by mouth daily at 12 noon.       . Omega-3 Fatty Acids (FISH OIL) 1200 MG CAPS Take 1 capsule by mouth daily at 12 noon.       Marland Kitchen telmisartan (MICARDIS) 80 MG tablet Take 80 mg by mouth every morning.      . tiotropium (SPIRIVA) 18 MCG inhalation capsule Place 1 capsule (18 mcg total) into inhaler and inhale daily. Use as directed  30 capsule  5  . vitamin B-12 (CYANOCOBALAMIN) 500 MCG tablet Take 500 mcg by mouth every morning.         BP 150/100  Pulse 74  Wt 198 lb (89.812 kg)  SpO2 98%chart    Objective:   Physical Exam  Constitutional: She is oriented to person, place, and time. She appears well-developed and well-nourished.  Neck: Normal range of motion. Neck supple.  Cardiovascular: Normal rate, regular rhythm and normal heart sounds.   Pulmonary/Chest: Effort normal and breath sounds normal.       Blood pressure recheck: 142/90  Musculoskeletal: Normal range of motion. She exhibits no edema and no tenderness.  Neurological: She is alert and oriented to person, place, and time.  Skin: Skin is warm and dry.       Feeling varicose veins of the right foot. Dermabond still intact. No signs of any infection or inflammation. Pedal pulses 2 out of 2.  Psychiatric: She has a normal mood and affect.          Assessment & Plan:  Assessment:  1. Varicose pains-improved 2. Hypertension 3. COPD, being managed by pulmonary  Plan: Continue  current  medications. Advised to call the office if she had similar symptoms in the future. Return for fasting visit in March as previously scheduled.

## 2012-03-09 ENCOUNTER — Other Ambulatory Visit: Payer: Self-pay | Admitting: Family Medicine

## 2012-03-09 NOTE — Telephone Encounter (Signed)
Needs office visit.

## 2012-03-27 ENCOUNTER — Ambulatory Visit (INDEPENDENT_AMBULATORY_CARE_PROVIDER_SITE_OTHER): Payer: Medicare Other | Admitting: Family

## 2012-03-27 ENCOUNTER — Encounter: Payer: Self-pay | Admitting: Family

## 2012-03-27 VITALS — BP 130/90 | HR 76 | Wt 202.0 lb

## 2012-03-27 DIAGNOSIS — K219 Gastro-esophageal reflux disease without esophagitis: Secondary | ICD-10-CM

## 2012-03-27 DIAGNOSIS — F411 Generalized anxiety disorder: Secondary | ICD-10-CM | POA: Diagnosis not present

## 2012-03-27 DIAGNOSIS — E7800 Pure hypercholesterolemia, unspecified: Secondary | ICD-10-CM

## 2012-03-27 DIAGNOSIS — I10 Essential (primary) hypertension: Secondary | ICD-10-CM

## 2012-03-27 DIAGNOSIS — J441 Chronic obstructive pulmonary disease with (acute) exacerbation: Secondary | ICD-10-CM

## 2012-03-27 LAB — HEPATIC FUNCTION PANEL
Albumin: 3.8 g/dL (ref 3.5–5.2)
Alkaline Phosphatase: 43 U/L (ref 39–117)
Bilirubin, Direct: 0 mg/dL (ref 0.0–0.3)

## 2012-03-27 LAB — BASIC METABOLIC PANEL
Calcium: 9.7 mg/dL (ref 8.4–10.5)
Creatinine, Ser: 1 mg/dL (ref 0.4–1.2)
GFR: 54.95 mL/min — ABNORMAL LOW (ref >60.00)
Sodium: 141 mEq/L (ref 135–145)

## 2012-03-27 LAB — CBC WITH DIFFERENTIAL/PLATELET
Basophils Absolute: 0 10*3/uL (ref 0.0–0.1)
Basophils Relative: 0.7 % (ref 0.0–3.0)
HCT: 40.3 % (ref 36.0–46.0)
Hemoglobin: 13.5 g/dL (ref 12.0–15.0)
Lymphs Abs: 1.5 10*3/uL (ref 0.7–4.0)
MCHC: 33.5 g/dL (ref 30.0–36.0)
Monocytes Relative: 7.9 % (ref 3.0–12.0)
Neutro Abs: 4.9 10*3/uL (ref 1.4–7.7)
RBC: 4.31 Mil/uL (ref 3.87–5.11)
RDW: 13.4 % (ref 11.5–14.6)

## 2012-03-27 LAB — LIPID PANEL
HDL: 53.6 mg/dL (ref >39.00)
Total CHOL/HDL Ratio: 4
Triglycerides: 101 mg/dL (ref 0.0–149.0)

## 2012-03-27 MED ORDER — HYDROCHLOROTHIAZIDE 25 MG PO TABS: 25.0000 mg | ORAL_TABLET | Freq: Every day | ORAL | Status: DC

## 2012-03-27 MED ORDER — TELMISARTAN 80 MG PO TABS: 80.0000 mg | ORAL_TABLET | Freq: Every morning | ORAL | Status: DC

## 2012-03-27 MED ORDER — TIOTROPIUM BROMIDE MONOHYDRATE 18 MCG IN CAPS: 18.0000 ug | ORAL_CAPSULE | Freq: Every day | RESPIRATORY_TRACT | Status: DC

## 2012-03-27 NOTE — Patient Instructions (Addendum)
Varicose Veins Varicose veins are veins that have become enlarged and twisted. CAUSES This condition is the result of valves in the veins not working properly. Valves in the veins help return blood from the leg to the heart. If these valves are damaged, blood flows backwards and backs up into the veins in the leg near the skin. This causes the veins to become larger. People who are on their feet a lot, who are pregnant, or who are overweight are more likely to develop varicose veins. SYMPTOMS   Bulging, twisted-appearing, bluish veins, most commonly found on the legs.  Leg pain or a feeling of heaviness. These symptoms may be worse at the end of the day.  Leg swelling.  Skin color changes. DIAGNOSIS  Varicose veins can usually be diagnosed with an exam of your legs by your caregiver. He or she may recommend an ultrasound of your leg veins. TREATMENT  Most varicose veins can be treated at home.However, other treatments are available for people who have persistent symptoms or who want to treat the cosmetic appearance of the varicose veins. These include:  Laser treatment of very small varicose veins.  Medicine that is shot (injected) into the vein. This medicine hardens the walls of the vein and closes off the vein. This treatment is called sclerotherapy. Afterwards, you may need to wear clothing or bandages that apply pressure.  Surgery. HOME CARE INSTRUCTIONS   Do not stand or sit in one position for long periods of time. Do not sit with your legs crossed. Rest with your legs raised during the day.  Wear elastic stockings or support hose. Do not wear other tight, encircling garments around the legs, pelvis, or waist.  Walk as much as possible to increase blood flow.  Raise the foot of your bed at night with 2-inch blocks.  If you get a cut in the skin over the vein and the vein bleeds, lie down with your leg raised and press on it with a clean cloth until the bleeding stops. Then  place a bandage (dressing) on the cut. See your caregiver if it continues to bleed or needs stitches. SEEK MEDICAL CARE IF:   The skin around your ankle starts to break down.  You have pain, redness, tenderness, or hard swelling developing in your leg over a vein.  You are uncomfortable due to leg pain. Document Released: 10/13/2004 Document Revised: 03/28/2011 Document Reviewed: 03/01/2010 Exit[employer] Patient Information 2013 [employer].  

## 2012-03-27 NOTE — Progress Notes (Signed)
  Subjective:    Patient ID: Melissa Ferrell, female    DOB: 1935-07-22, 77 y.o.   MRN: [id number]  HPI  77 year old white female, nonsmoker is in for a three-month recheck of hyperlipidemia, anxiety, depression, hypertension, GERD, and COPD. Patient reports doing well. She reports being slightly more stress than usual over the last couple weeks. She's taken Xanax about twice a week. Tolerates all medications well.  Review of Systems  Constitutional: Negative.   HENT: Negative.   Respiratory: Negative.   Cardiovascular: Negative.   Gastrointestinal: Negative.   Genitourinary: Negative.   Musculoskeletal: Negative.   Skin: Negative.   Neurological: Negative.   Psychiatric/Behavioral: Negative.        Objective:   Physical Exam  Constitutional: She is oriented to person, place, and time. She appears well-developed and well-nourished.  HENT:  Right Ear: External ear normal.  Left Ear: External ear normal.  Nose: Nose normal.  Mouth/Throat: Oropharynx is clear and moist.  Neck: Normal range of motion. Neck supple. No thyromegaly present.  Cardiovascular: Normal rate, regular rhythm and normal heart sounds.   Recheck blood pressure 130/86  Pulmonary/Chest: Effort normal and breath sounds normal.  Abdominal: Soft. Bowel sounds are normal.  Musculoskeletal: Normal range of motion.  Neurological: She is alert and oriented to person, place, and time.  Skin: Skin is warm and dry.  Psychiatric: She has a normal mood and affect.          Assessment & Plan:  Assessment:  1. Hyperlipidemia 2. Anxiety 3. Depression 4. Hypertension 5. COPD 6. GERD  Plan: Continue current medications. Lab sent to include BMP, CBC, lipids, LFTs will notify patient pending results. Encouraged healthy diet, exercise. Followup with patient in the results of her labs, in 4 months, and sooner as needed.

## 2012-03-28 ENCOUNTER — Ambulatory Visit (INDEPENDENT_AMBULATORY_CARE_PROVIDER_SITE_OTHER): Payer: Medicare Other | Admitting: Pulmonary Disease

## 2012-03-28 ENCOUNTER — Encounter: Payer: Self-pay | Admitting: Pulmonary Disease

## 2012-03-28 VITALS — BP 140/92 | HR 66 | Temp 98.5°F | Ht 64.0 in | Wt 201.6 lb

## 2012-03-28 DIAGNOSIS — J4489 Other specified chronic obstructive pulmonary disease: Secondary | ICD-10-CM

## 2012-03-28 MED ORDER — ALBUTEROL SULFATE HFA 108 (90 BASE) MCG/ACT IN AERS: 2.0000 | INHALATION_SPRAY | Freq: Four times a day (QID) | RESPIRATORY_TRACT | Status: DC | PRN

## 2012-03-28 NOTE — Patient Instructions (Addendum)
Ok to come off arcapta for a short period of time to see if your other symptoms improve, but restart if no change or if your breathing worsens.  Very unlikely arcapta is contributing to your joint issues or neuropathy. Will refer you to pulmonary rehab at [facility]. We will send in a prescription for your proair month to month.  followup with me in 6mos if doing well.

## 2012-03-28 NOTE — Progress Notes (Signed)
  Subjective:    Patient ID: Melissa Ferrell, female    DOB: [birth year], 77 y.o.   MRN: [id number]  HPI Patient comes in today for followup of her known COPD.  At the last visit she was started on arcapta because of the excessive beta agonist use.  She has seen significant improvement in her breathing on his medication, and rarely has to use her rescue inhaler at this point.  She has also not had any acute exacerbations since last visit.  She is trying to participate in regular exercise, but has never been to pulmonary rehabilitation.  She denies any significant cough or mucus production.  She is questioning whether arcapta could cause increasing joint symptoms or neuropathy.   Review of Systems  Constitutional: Negative for fever and unexpected weight change.  HENT: Negative for ear pain, nosebleeds, congestion, sore throat, rhinorrhea, sneezing, trouble swallowing, dental problem, postnasal drip and sinus pressure.   Eyes: Negative for redness and itching.  Respiratory: Negative for cough, chest tightness, shortness of breath and wheezing.   Cardiovascular: Positive for leg swelling. Negative for palpitations.  Gastrointestinal: Negative for nausea and vomiting.  Genitourinary: Negative for dysuria.  Musculoskeletal: Positive for joint swelling and arthralgias.  Skin: Positive for wound (left first finger (joint)--stabbed finger yesterday afternoon with some lawn scissors --inflamed and possibly infected). Negative for rash.  Neurological: Negative for headaches.  Hematological: Does not bruise/bleed easily.  Psychiatric/Behavioral: Negative for dysphoric mood. The patient is not nervous/anxious.        Objective:   Physical Exam Overweight female in no acute distress Nose without purulence or discharge noted Neck without lymphadenopathy or thyromegaly Chest with mildly decreased breath sounds, no wheezes or rhonchi Cardiac exam with regular rate and rhythm Lower extremities with 1+  edema, no cyanosis Alert and oriented, moves all 4 extremities.       Assessment & Plan:

## 2012-03-28 NOTE — Assessment & Plan Note (Signed)
The patient saw considerable improvement in her breathing with the addition of arcapta, and greatly reduced her rescue inhaler use.  However, she is wondering if this is causing some increased joint symptoms and neuropathy.  I have told her this medication typically would not cause the symptoms, however if she wanted to come off the medication for a short period of time to test this theory I would be okay with it.  Also stressed to her the importance of conditioning and weight loss, and will refer her to pulmonary rehab program.

## 2012-03-28 NOTE — Addendum Note (Signed)
Addended by: Nita Sells on: 03/28/2012 10:06 AM   Modules accepted: Orders, Medications

## 2012-04-18 ENCOUNTER — Encounter: Payer: Self-pay | Admitting: Pulmonary Disease

## 2012-05-07 ENCOUNTER — Encounter (HOSPITAL_COMMUNITY): Payer: Self-pay | Admitting: Cardiac Rehabilitation

## 2012-05-07 ENCOUNTER — Telehealth: Payer: Self-pay | Admitting: Family

## 2012-05-07 MED ORDER — HYDROCHLOROTHIAZIDE 25 MG PO TABS: 25.0000 mg | ORAL_TABLET | Freq: Every morning | ORAL | Status: DC

## 2012-05-07 NOTE — Telephone Encounter (Signed)
Pt needs refill on hctz 25 mg #30 call into [facility] [phone]

## 2012-05-15 ENCOUNTER — Encounter (HOSPITAL_COMMUNITY): Payer: Self-pay

## 2012-05-15 ENCOUNTER — Encounter (HOSPITAL_COMMUNITY)
Admission: RE | Admit: 2012-05-15 | Discharge: 2012-05-15 | Disposition: A | Discharge status: Home / Self Care | Payer: Medicare Other | Source: Ambulatory Visit | Attending: Pulmonary Disease | Admitting: Pulmonary Disease

## 2012-05-15 DIAGNOSIS — J449 Chronic obstructive pulmonary disease, unspecified: Secondary | ICD-10-CM | POA: Diagnosis not present

## 2012-05-15 DIAGNOSIS — Z5189 Encounter for other specified aftercare: Secondary | ICD-10-CM | POA: Insufficient documentation

## 2012-05-15 DIAGNOSIS — J4489 Other specified chronic obstructive pulmonary disease: Secondary | ICD-10-CM | POA: Insufficient documentation

## 2012-05-15 NOTE — Progress Notes (Signed)
Pt participated in pulmonary rehab orientation today.  Pt oriented to program guidelines and participant expectations.  Pt instructed in purse-lip breathing and demonstrated understanding.   Pt alert and oriented, well kept, appears same as her stated age.   normal skin color.   Pt lungs clear.    Heart regular rate and rhythm.  Bowel sounds active x4.  Equal grip strength and lower extremity strength.  No pedal edema present.  VSS.  PHQ-9 score of 1. Pt reports history of severe depression for which she was hospitalized many years ago.  Pt currently denies symptoms, reports episodes are cyclic and she is able to self manage symptoms when she feels they are worsening.   Appropriate achievable goals self identified by patient.  Pt verbalized understanding.  Pt scheduled to begin program   05/22/2012 .  6 minute walk test performed without difficulty.

## 2012-05-17 ENCOUNTER — Encounter (HOSPITAL_COMMUNITY): Admission: RE | Admit: 2012-05-17 | Payer: Medicare Other | Source: Ambulatory Visit

## 2012-05-17 ENCOUNTER — Ambulatory Visit (HOSPITAL_COMMUNITY): Payer: Medicare Other

## 2012-05-22 ENCOUNTER — Encounter (HOSPITAL_COMMUNITY)
Admission: RE | Admit: 2012-05-22 | Discharge: 2012-05-22 | Disposition: A | Discharge status: Home / Self Care | Payer: Medicare Other | Source: Ambulatory Visit | Attending: Pulmonary Disease | Admitting: Pulmonary Disease

## 2012-05-22 DIAGNOSIS — Z5189 Encounter for other specified aftercare: Secondary | ICD-10-CM | POA: Insufficient documentation

## 2012-05-22 DIAGNOSIS — J449 Chronic obstructive pulmonary disease, unspecified: Secondary | ICD-10-CM | POA: Insufficient documentation

## 2012-05-22 DIAGNOSIS — J4489 Other specified chronic obstructive pulmonary disease: Secondary | ICD-10-CM | POA: Insufficient documentation

## 2012-05-22 NOTE — Progress Notes (Signed)
Ms Pangle participated today in first exercise session of Pulmonary Rehab.  Oriented to equipment use, safety, hand hygiene. Patient voices understanding.  Demonstration and practice of PLB on each exercise station.  She did try recumbent bike and felt discomfort in her knees.  She was moved to SCIFIT bike and tolerated well. Reassured and encouraged.  Cathie Olden RN

## 2012-05-24 ENCOUNTER — Encounter (HOSPITAL_COMMUNITY)
Admission: RE | Admit: 2012-05-24 | Discharge: 2012-05-24 | Disposition: A | Discharge status: Home / Self Care | Payer: Medicare Other | Source: Ambulatory Visit | Attending: Pulmonary Disease | Admitting: Pulmonary Disease

## 2012-05-29 ENCOUNTER — Encounter (HOSPITAL_COMMUNITY)
Admission: RE | Admit: 2012-05-29 | Discharge: 2012-05-29 | Disposition: A | Discharge status: Home / Self Care | Payer: Medicare Other | Source: Ambulatory Visit | Attending: Pulmonary Disease | Admitting: Pulmonary Disease

## 2012-05-31 ENCOUNTER — Encounter (HOSPITAL_COMMUNITY)
Admission: RE | Admit: 2012-05-31 | Discharge: 2012-05-31 | Disposition: A | Discharge status: Home / Self Care | Payer: Medicare Other | Source: Ambulatory Visit | Attending: Pulmonary Disease | Admitting: Pulmonary Disease

## 2012-06-05 ENCOUNTER — Encounter (HOSPITAL_COMMUNITY)
Admission: RE | Admit: 2012-06-05 | Discharge: 2012-06-05 | Disposition: A | Discharge status: Home / Self Care | Payer: Medicare Other | Source: Ambulatory Visit | Attending: Pulmonary Disease | Admitting: Pulmonary Disease

## 2012-06-07 ENCOUNTER — Encounter (HOSPITAL_COMMUNITY)
Admission: RE | Admit: 2012-06-07 | Discharge: 2012-06-07 | Disposition: A | Discharge status: Home / Self Care | Payer: Medicare Other | Source: Ambulatory Visit | Attending: Pulmonary Disease | Admitting: Pulmonary Disease

## 2012-06-12 ENCOUNTER — Encounter (HOSPITAL_COMMUNITY)
Admission: RE | Admit: 2012-06-12 | Discharge: 2012-06-12 | Disposition: A | Discharge status: Home / Self Care | Payer: Medicare Other | Source: Ambulatory Visit | Attending: Pulmonary Disease | Admitting: Pulmonary Disease

## 2012-06-14 ENCOUNTER — Encounter (HOSPITAL_COMMUNITY)
Admission: RE | Admit: 2012-06-14 | Discharge: 2012-06-14 | Disposition: A | Discharge status: Home / Self Care | Payer: Medicare Other | Source: Ambulatory Visit | Attending: Pulmonary Disease | Admitting: Pulmonary Disease

## 2012-06-14 ENCOUNTER — Other Ambulatory Visit: Payer: Self-pay

## 2012-06-14 MED ORDER — ALPRAZOLAM 0.25 MG PO TABS: 0.2500 mg | ORAL_TABLET | Freq: Every evening | ORAL | Status: DC | PRN

## 2012-06-19 ENCOUNTER — Encounter (HOSPITAL_COMMUNITY)
Admission: RE | Admit: 2012-06-19 | Discharge: 2012-06-19 | Disposition: A | Discharge status: Home / Self Care | Payer: Medicare Other | Source: Ambulatory Visit | Attending: Pulmonary Disease | Admitting: Pulmonary Disease

## 2012-06-19 DIAGNOSIS — J449 Chronic obstructive pulmonary disease, unspecified: Secondary | ICD-10-CM | POA: Insufficient documentation

## 2012-06-19 DIAGNOSIS — Z5189 Encounter for other specified aftercare: Secondary | ICD-10-CM | POA: Diagnosis not present

## 2012-06-19 DIAGNOSIS — J4489 Other specified chronic obstructive pulmonary disease: Secondary | ICD-10-CM | POA: Insufficient documentation

## 2012-06-20 ENCOUNTER — Other Ambulatory Visit: Payer: Self-pay

## 2012-06-20 MED ORDER — HYDROCHLOROTHIAZIDE 25 MG PO TABS: 25.0000 mg | ORAL_TABLET | Freq: Every morning | ORAL | Status: DC

## 2012-06-21 ENCOUNTER — Encounter (HOSPITAL_COMMUNITY)
Admission: RE | Admit: 2012-06-21 | Discharge: 2012-06-21 | Disposition: A | Discharge status: Home / Self Care | Payer: Medicare Other | Source: Ambulatory Visit | Attending: Pulmonary Disease | Admitting: Pulmonary Disease

## 2012-06-21 NOTE — Progress Notes (Signed)
Before exercising in Pulmonary Rehab today patient felt an irregular pulse, was placed on cardiac monitor and was experiencing premature atrial contractions.  Pt felt fine, asymptomatic.

## 2012-06-21 NOTE — Progress Notes (Signed)
Rhythm strip sent to Dr. Ludwig Clarks office.

## 2012-06-26 ENCOUNTER — Encounter (HOSPITAL_COMMUNITY)
Admission: RE | Admit: 2012-06-26 | Discharge: 2012-06-26 | Disposition: A | Discharge status: Home / Self Care | Payer: Medicare Other | Source: Ambulatory Visit | Attending: Pulmonary Disease | Admitting: Pulmonary Disease

## 2012-06-28 ENCOUNTER — Encounter (HOSPITAL_COMMUNITY)
Admission: RE | Admit: 2012-06-28 | Discharge: 2012-06-28 | Disposition: A | Discharge status: Home / Self Care | Payer: Medicare Other | Source: Ambulatory Visit | Attending: Pulmonary Disease | Admitting: Pulmonary Disease

## 2012-07-03 ENCOUNTER — Encounter (HOSPITAL_COMMUNITY)
Admission: RE | Admit: 2012-07-03 | Discharge: 2012-07-03 | Disposition: A | Discharge status: Home / Self Care | Payer: Medicare Other | Source: Ambulatory Visit | Attending: Pulmonary Disease | Admitting: Pulmonary Disease

## 2012-07-05 ENCOUNTER — Encounter (HOSPITAL_COMMUNITY)
Admission: RE | Admit: 2012-07-05 | Discharge: 2012-07-05 | Disposition: A | Discharge status: Home / Self Care | Payer: Medicare Other | Source: Ambulatory Visit | Attending: Pulmonary Disease | Admitting: Pulmonary Disease

## 2012-07-10 ENCOUNTER — Encounter (HOSPITAL_COMMUNITY)
Admission: RE | Admit: 2012-07-10 | Discharge: 2012-07-10 | Disposition: A | Discharge status: Home / Self Care | Payer: Medicare Other | Source: Ambulatory Visit | Attending: Pulmonary Disease | Admitting: Pulmonary Disease

## 2012-07-12 ENCOUNTER — Other Ambulatory Visit: Payer: Self-pay | Admitting: Pulmonary Disease

## 2012-07-12 ENCOUNTER — Encounter (HOSPITAL_COMMUNITY)
Admission: RE | Admit: 2012-07-12 | Discharge: 2012-07-12 | Disposition: A | Discharge status: Home / Self Care | Payer: Medicare Other | Source: Ambulatory Visit | Attending: Pulmonary Disease | Admitting: Pulmonary Disease

## 2012-07-17 ENCOUNTER — Encounter (HOSPITAL_COMMUNITY)
Admission: RE | Admit: 2012-07-17 | Discharge: 2012-07-17 | Disposition: A | Discharge status: Home / Self Care | Payer: Medicare Other | Source: Ambulatory Visit | Attending: Pulmonary Disease | Admitting: Pulmonary Disease

## 2012-07-17 DIAGNOSIS — J449 Chronic obstructive pulmonary disease, unspecified: Secondary | ICD-10-CM | POA: Insufficient documentation

## 2012-07-17 DIAGNOSIS — Z5189 Encounter for other specified aftercare: Secondary | ICD-10-CM | POA: Diagnosis not present

## 2012-07-17 DIAGNOSIS — J4489 Other specified chronic obstructive pulmonary disease: Secondary | ICD-10-CM | POA: Insufficient documentation

## 2012-07-19 ENCOUNTER — Encounter (HOSPITAL_COMMUNITY)
Admission: RE | Admit: 2012-07-19 | Discharge: 2012-07-19 | Disposition: A | Discharge status: Home / Self Care | Payer: Medicare Other | Source: Ambulatory Visit | Attending: Pulmonary Disease | Admitting: Pulmonary Disease

## 2012-07-24 ENCOUNTER — Encounter (HOSPITAL_COMMUNITY)
Admission: RE | Admit: 2012-07-24 | Discharge: 2012-07-24 | Disposition: A | Discharge status: Home / Self Care | Payer: Medicare Other | Source: Ambulatory Visit | Attending: Pulmonary Disease | Admitting: Pulmonary Disease

## 2012-07-26 ENCOUNTER — Encounter (HOSPITAL_COMMUNITY)
Admission: RE | Admit: 2012-07-26 | Discharge: 2012-07-26 | Disposition: A | Discharge status: Home / Self Care | Payer: Medicare Other | Source: Ambulatory Visit | Attending: Pulmonary Disease | Admitting: Pulmonary Disease

## 2012-07-27 ENCOUNTER — Encounter: Payer: Self-pay | Admitting: Family

## 2012-07-27 ENCOUNTER — Ambulatory Visit (INDEPENDENT_AMBULATORY_CARE_PROVIDER_SITE_OTHER): Payer: Medicare Other | Admitting: Family

## 2012-07-27 VITALS — BP 140/90 | HR 61 | Wt 201.0 lb

## 2012-07-27 DIAGNOSIS — K219 Gastro-esophageal reflux disease without esophagitis: Secondary | ICD-10-CM

## 2012-07-27 DIAGNOSIS — I2510 Atherosclerotic heart disease of native coronary artery without angina pectoris: Secondary | ICD-10-CM | POA: Diagnosis not present

## 2012-07-27 DIAGNOSIS — J449 Chronic obstructive pulmonary disease, unspecified: Secondary | ICD-10-CM

## 2012-07-27 DIAGNOSIS — I10 Essential (primary) hypertension: Secondary | ICD-10-CM | POA: Diagnosis not present

## 2012-07-27 LAB — BASIC METABOLIC PANEL
BUN: 16 mg/dL (ref 6–23)
CO2: 29 mEq/L (ref 19–32)
Chloride: 96 mEq/L (ref 96–112)
Creatinine, Ser: 1 mg/dL (ref 0.4–1.2)
Glucose, Bld: 95 mg/dL (ref 70–99)

## 2012-07-27 MED ORDER — HYDROCHLOROTHIAZIDE 25 MG PO TABS: 25.0000 mg | ORAL_TABLET | Freq: Every morning | ORAL | Status: DC

## 2012-07-27 NOTE — Patient Instructions (Addendum)
Compression Stockings Compression stockings are elastic stockings that "compress" your legs. This helps to increase blood flow, decrease swelling, and reduces the chance of getting blood clots in your lower legs. Compression stockings are used:  After surgery.  If you have a history of poor circulation.  If you are prone to blood clots.  If you have varicose veins.  If you sit or are bedridden for long periods of time. WEARING COMPRESSION STOCKINGS  Your compression stockings should be worn as instructed by your caregiver.  Wearing the correct stocking size is important. Your caregiver can help measure and fit you to the correct size.  When wearing your stockings, do not allow the stockings to bunch up. This is especially important around your toes or behind your knees. Keep the stockings as smooth as possible.  Do not roll the stockings downward and leave them rolled down. This can form a restrictive band around your legs and can decrease blood flow.  The stockings should be removed once a day for 1 hour or as instructed by your caregiver. When the stockings are taken off, inspect your legs and feet. Look for:  Open sores.  Red spots.  Puffy areas (swelling).  Anything that does not seem normal. IMPORTANT INFORMATION ABOUT COMPRESSION STOCKINGS  The compression stockings should be clean, dry, and in good condition before you put them on.  Do not put lotion on your legs or feet. This makes it harder to put the stockings on.  Change your stockings immediately if they become wet or soiled.  Do not wear stockings that are ripped or torn.  You may hand-wash or put your stockings in the washing machine. Use cold or warm water with mild detergent. Do not bleach your stockings. They may be air-dried or dried in the dryer on low heat.  If you have pain or have a feeling of "pins and needles" in your feet or legs, you may be wearing stockings that are too tight. Call your caregiver  right away. SEEK IMMEDIATE MEDICAL CARE IF:   You have numbness or tingling in your lower legs that does not get better quickly after the stockings are removed.  Your toes or feet become cold and blue.  You develop open sores or have red spots on your legs that do not go away. MAKE SURE YOU:   Understand these instructions.  Will watch your condition.  Will get help right away if you are not doing well or get worse. Document Released: 10/31/2008 Document Revised: 03/28/2011 Document Reviewed: 10/31/2008 Exit[employer] Patient Information 2014 [employer].  

## 2012-07-27 NOTE — Progress Notes (Signed)
Subjective:    Patient ID: Melissa Ferrell, female    DOB: 1935-12-30, 77 y.o.   MRN: [id number]  HPI 77 year old white female, nonsmoker, is in for recheck of coronary artery disease, COPD, anxiety, depression, hypertension. She's currently doing well. Denies any concerns. She is seeing pulmonary rehabilitation that's been very beneficial to her subjectively.    Review of Systems  Constitutional: Negative.   HENT: Negative.   Respiratory: Negative.   Cardiovascular: Negative.   Gastrointestinal: Negative.   Endocrine: Negative.   Genitourinary: Negative.   Musculoskeletal: Negative.   Skin: Negative.   Allergic/Immunologic: Negative.   Neurological: Negative.   Hematological: Negative.   Psychiatric/Behavioral: Negative.    Past Medical History  Diagnosis Date  . HTN (hypertension)   . Hyperlipidemia   . IBS (irritable bowel syndrome)   . PUD (peptic ulcer disease)     secondary to NSAID-1975  . Depression   . Scarlet fever   . Emphysema   . Osteoporosis   . GERD (gastroesophageal reflux disease)   . Hx of adenomatous colonic polyps   . Anxiety   . Hemorrhoids   . Migraine headache   . Rheumatoid arthritis(714.0)   . Diastolic dysfunction   . History of colonoscopy   . DDD (degenerative disc disease)   . Fracture, spinal   . Carotid artery occlusion     History   Social History  . Marital Status: Divorced    Spouse Name: N/A    Number of Children: 3  . Years of Education: N/A   Occupational History  . Retired-homemaker    Social History Main Topics  . Smoking status: Former Smoker -- 3.00 packs/day for 30 years    Types: Cigarettes    Quit date: 01/17/1985  . Smokeless tobacco: Never Used     Comment: previous 80 pack year history  . Alcohol Use: No  . Drug Use: No  . Sexually Active: Not on file   Other Topics Concern  . Not on file   Social History Narrative  . No narrative on file    Past Surgical History  Procedure Laterality Date  .  Thyroglossal duct cyst      1943, 1944, 1973  . Chronically infected lymph node removed  1973    Family History  Problem Relation Age of Onset  . Heart disease Mother     MI  . Hypertension Mother   . Stomach cancer Brother   . Melanoma Brother   . Colon cancer Brother   . Heart disease Brother   . Alcohol abuse    . Heart attack Brother 44  . Allergies Sister   . Breast cancer Maternal Grandmother   . Brain cancer Father     Allergies  Allergen Reactions  . Azithromycin   . Bee Venom   . Benzocaine   . Cefdinir   . Ciprofloxacin   . Diphenhydramine Hcl   . Indomethacin   . Lasix (Furosemide)     Low tolerance    . Other     Adhesive tape  . Paba Derivatives   . Penicillins   . Prednisone     Low tolerance   . Statins     Low tolerance    . Zetia (Ezetimibe)     Low tolerance     Current Outpatient Prescriptions on File Prior to Visit  Medication Sig Dispense Refill  . albuterol (PROAIR HFA) 108 (90 BASE) MCG/ACT inhaler Inhale 2 puffs into the lungs every 6 (six)  hours as needed for wheezing.  1 Inhaler  11  . ALPRAZolam (XANAX) 0.25 MG tablet Take 1 tablet (0.25 mg total) by mouth at bedtime as needed. Anxiety/sleep  30 tablet  2  . ARCAPTA NEOHALER 75 MCG CAPS PLACE ONE CAPSULE INTO THE INHALER AND INHALE DAILY  30 capsule  4  . aspirin 81 MG tablet Take 81 mg by mouth 2 (two) times daily.       . Calcium Carbonate-Vitamin D 600-400 MG-UNIT per tablet Take 1 tablet by mouth 2 (two) times daily.       . carvedilol (COREG) 6.25 MG tablet Take 1 tablet (6.25 mg total) by mouth 2 (two) times daily.  180 tablet  3  . Flaxseed, Linseed, (FLAXSEED OIL) 1000 MG CAPS Take 1 capsule by mouth daily at 12 noon.       . folic acid (FOLVITE) 1 MG tablet Take 1 mg by mouth daily at 12 noon.       . hydroxypropyl methylcellulose (ISOPTO TEARS) 2.5 % ophthalmic solution Place 1 drop into both eyes daily as needed. Dry eyes      . Indacaterol Maleate 75 MCG CAPS Place 1  capsule into inhaler and inhale daily. Uses right after spiriva      . Lecithin 1200 MG CAPS Take 1,200 mg by mouth every morning.       . Magnesium 250 MG TABS Take 1 tablet by mouth every morning.       . Multiple Vitamins-Minerals (CENTRUM SILVER PO) Take 1 tablet by mouth every morning.       . Multiple Vitamins-Minerals (OCUVITE EYE HEALTH FORMULA PO) Take 1 tablet by mouth daily at 12 noon.       . Omega-3 Fatty Acids (FISH OIL) 1200 MG CAPS Take 1 capsule by mouth daily at 12 noon.       Marland Kitchen telmisartan (MICARDIS) 80 MG tablet Take 1 tablet (80 mg total) by mouth every morning.  60 tablet  3  . tiotropium (SPIRIVA) 18 MCG inhalation capsule Place 1 capsule (18 mcg total) into inhaler and inhale daily. Use as directed  30 capsule  5  . vitamin B-12 (CYANOCOBALAMIN) 500 MCG tablet Take 500 mcg by mouth every morning.        No current facility-administered medications on file prior to visit.    BP 140/90  Pulse 61  Wt 201 lb (91.173 kg)  BMI 34.48 kg/m2  SpO2 97%chart    Objective:   Physical Exam  Constitutional: She is oriented to person, place, and time. She appears well-developed and well-nourished.  HENT:  Right Ear: External ear normal.  Left Ear: External ear normal.  Nose: Nose normal.  Mouth/Throat: Oropharynx is clear and moist.  Neck: Normal range of motion. Neck supple. No thyromegaly present.  Cardiovascular: Normal rate, regular rhythm and normal heart sounds.   Pulmonary/Chest: Effort normal and breath sounds normal.  Abdominal: Soft. Bowel sounds are normal.  Musculoskeletal: Normal range of motion.  Neurological: She is alert and oriented to person, place, and time.  Skin: Skin is warm and dry.  Psychiatric: She has a normal mood and affect.          Assessment & Plan:  Assessment: 1. Coronary artery disease 2. COPD 3. Hypertension 4. Anxiety 5. Depression  Plan: Continue current medications. We'll obtain BMP today. Her next office visit we'll get  fasting labs. Patient advised to come fasting. Call the office with any questions or concerns. Recheck a schedule, and  as needed.

## 2012-07-30 ENCOUNTER — Telehealth: Payer: Self-pay | Admitting: Family

## 2012-07-30 NOTE — Telephone Encounter (Signed)
Advised pt per AVS instructions: The stockings should be removed once a day for 1 hour or as instructed by your caregiver. When the stockings are taken off, inspect your legs and feet

## 2012-07-30 NOTE — Telephone Encounter (Signed)
Pt was rx'd compression stockings last week - working great. States Padonda said to wear them 2hrs. Pt wants to know if she can wear them longer? Please advise and call.

## 2012-07-31 ENCOUNTER — Encounter (HOSPITAL_COMMUNITY)
Admission: RE | Admit: 2012-07-31 | Discharge: 2012-07-31 | Disposition: A | Discharge status: Home / Self Care | Payer: Medicare Other | Source: Ambulatory Visit | Attending: Pulmonary Disease | Admitting: Pulmonary Disease

## 2012-08-02 ENCOUNTER — Encounter (HOSPITAL_COMMUNITY)
Admission: RE | Admit: 2012-08-02 | Discharge: 2012-08-02 | Disposition: A | Discharge status: Home / Self Care | Payer: Medicare Other | Source: Ambulatory Visit | Attending: Pulmonary Disease | Admitting: Pulmonary Disease

## 2012-08-07 ENCOUNTER — Encounter (HOSPITAL_COMMUNITY)
Admission: RE | Admit: 2012-08-07 | Discharge: 2012-08-07 | Disposition: A | Discharge status: Home / Self Care | Payer: Medicare Other | Source: Ambulatory Visit | Attending: Pulmonary Disease | Admitting: Pulmonary Disease

## 2012-08-09 ENCOUNTER — Encounter (HOSPITAL_COMMUNITY)
Admission: RE | Admit: 2012-08-09 | Discharge: 2012-08-09 | Disposition: A | Discharge status: Home / Self Care | Payer: Medicare Other | Source: Ambulatory Visit | Attending: Pulmonary Disease | Admitting: Pulmonary Disease

## 2012-08-14 ENCOUNTER — Encounter (HOSPITAL_COMMUNITY)
Admission: RE | Admit: 2012-08-14 | Discharge: 2012-08-14 | Disposition: A | Discharge status: Home / Self Care | Payer: Medicare Other | Source: Ambulatory Visit | Attending: Pulmonary Disease | Admitting: Pulmonary Disease

## 2012-08-14 NOTE — Progress Notes (Signed)
Today is the last day of exercise in Pulmonary Rehab for Melissa Ferrell.  She has shown improvement, met her goals.  She has done well incorporating PLB technique with her exercise.  She plans move to the maintenance pulmonary rehab starting in August.   Delightful lady.  Cathie Olden RN

## 2012-08-21 ENCOUNTER — Encounter (HOSPITAL_COMMUNITY)
Admission: RE | Admit: 2012-08-21 | Discharge: 2012-08-21 | Disposition: A | Discharge status: Home / Self Care | Payer: Self-pay | Source: Ambulatory Visit | Attending: Pulmonary Disease | Admitting: Pulmonary Disease

## 2012-08-21 DIAGNOSIS — J449 Chronic obstructive pulmonary disease, unspecified: Secondary | ICD-10-CM | POA: Insufficient documentation

## 2012-08-21 DIAGNOSIS — Z5189 Encounter for other specified aftercare: Secondary | ICD-10-CM | POA: Insufficient documentation

## 2012-08-21 DIAGNOSIS — J4489 Other specified chronic obstructive pulmonary disease: Secondary | ICD-10-CM | POA: Insufficient documentation

## 2012-08-21 NOTE — Progress Notes (Signed)
First day of exercise in Pulmonary Rehab Maintenance.  She did not encounter any difficulties with personal charting and equipment selections and settings.  Cathie Olden RN

## 2012-08-23 ENCOUNTER — Encounter: Payer: Self-pay | Admitting: Cardiology

## 2012-08-23 ENCOUNTER — Ambulatory Visit (INDEPENDENT_AMBULATORY_CARE_PROVIDER_SITE_OTHER): Payer: Medicare Other | Admitting: Cardiology

## 2012-08-23 ENCOUNTER — Encounter (HOSPITAL_COMMUNITY)
Admission: RE | Admit: 2012-08-23 | Discharge: 2012-08-23 | Disposition: A | Discharge status: Home / Self Care | Payer: Self-pay | Source: Ambulatory Visit | Attending: Pulmonary Disease | Admitting: Pulmonary Disease

## 2012-08-23 VITALS — BP 136/82 | HR 65 | Ht 64.0 in | Wt 200.0 lb

## 2012-08-23 DIAGNOSIS — I10 Essential (primary) hypertension: Secondary | ICD-10-CM

## 2012-08-23 MED ORDER — HYDROCHLOROTHIAZIDE 25 MG PO TABS: 12.5000 mg | ORAL_TABLET | Freq: Every morning | ORAL | Status: DC

## 2012-08-23 MED ORDER — TELMISARTAN 80 MG PO TABS: 40.0000 mg | ORAL_TABLET | Freq: Every morning | ORAL | Status: DC

## 2012-08-23 NOTE — Patient Instructions (Addendum)
Your physician wants you to follow-up in: ONE YEAR WITH DR Shelda Pal will receive a reminder letter in the mail two months in advance. If you don't receive a letter, please call our office to schedule the follow-up appointment.   DECREASE HCTZ 12.5 MG ONCE DAILY  DECREASE MICARDIAS TO 40 MG ONCE DAILY

## 2012-08-23 NOTE — Progress Notes (Signed)
HPI: Pleasant female for fu of congestive heart failure and hypertension. Myoview in May of 2011. Perfusion was normal. The study was not gated. Echocardiogram in August 2013 showed normal LV function. We felt that her dyspnea was most likely secondary to COPD. Carotid Dopplers in August of 2013 showed 0-39% bilateral stenosis and followup recommended in one year. I last saw her in August of 2013. Since then, she has dyspnea on exertion but improved. No orthopnea, PND, exertional chest pain. Occasional mild pedal edema.   Current Outpatient Prescriptions  Medication Sig Dispense Refill  . albuterol (PROAIR HFA) 108 (90 BASE) MCG/ACT inhaler Inhale 2 puffs into the lungs every 6 (six) hours as needed for wheezing.  1 Inhaler  11  . ALPRAZolam (XANAX) 0.25 MG tablet Take 1 tablet (0.25 mg total) by mouth at bedtime as needed. Anxiety/sleep  30 tablet  2  . ARCAPTA NEOHALER 75 MCG CAPS PLACE ONE CAPSULE INTO THE INHALER AND INHALE DAILY  30 capsule  4  . aspirin 81 MG tablet Take 81 mg by mouth 2 (two) times daily.       . Calcium Carbonate-Vitamin D 600-400 MG-UNIT per tablet Take 1 tablet by mouth 2 (two) times daily.       . carvedilol (COREG) 6.25 MG tablet Take 1 tablet (6.25 mg total) by mouth 2 (two) times daily.  180 tablet  3  . Flaxseed, Linseed, (FLAXSEED OIL) 1000 MG CAPS Take 1 capsule by mouth daily at 12 noon.       . folic acid (FOLVITE) 1 MG tablet Take 1 mg by mouth daily at 12 noon.       . hydrochlorothiazide (HYDRODIURIL) 25 MG tablet Take 1 tablet (25 mg total) by mouth every morning.  90 tablet  0  . hydroxypropyl methylcellulose (ISOPTO TEARS) 2.5 % ophthalmic solution Place 1 drop into both eyes daily as needed. Dry eyes      . Indacaterol Maleate 75 MCG CAPS Place 1 capsule into inhaler and inhale daily. Uses right after spiriva      . Lecithin 1200 MG CAPS Take 1,200 mg by mouth every morning.       . Magnesium 250 MG TABS Take 1 tablet by mouth every morning.       .  Multiple Vitamins-Minerals (CENTRUM SILVER PO) Take 1 tablet by mouth every morning.       . Multiple Vitamins-Minerals (OCUVITE EYE HEALTH FORMULA PO) Take 1 tablet by mouth daily at 12 noon.       . Omega-3 Fatty Acids (FISH OIL) 1200 MG CAPS Take 1 capsule by mouth daily at 12 noon.       Marland Kitchen telmisartan (MICARDIS) 80 MG tablet Take 1 tablet (80 mg total) by mouth every morning.  60 tablet  3  . tiotropium (SPIRIVA) 18 MCG inhalation capsule Place 1 capsule (18 mcg total) into inhaler and inhale daily. Use as directed  30 capsule  5  . vitamin B-12 (CYANOCOBALAMIN) 500 MCG tablet Take 500 mcg by mouth every morning.        No current facility-administered medications for this visit.     Past Medical History  Diagnosis Date  . HTN (hypertension)   . Hyperlipidemia   . IBS (irritable bowel syndrome)   . PUD (peptic ulcer disease)     secondary to NSAID-1975  . Depression   . Scarlet fever   . Emphysema   . Osteoporosis   . GERD (gastroesophageal reflux disease)   .  Hx of adenomatous colonic polyps   . Anxiety   . Hemorrhoids   . Migraine headache   . Rheumatoid arthritis(714.0)   . Diastolic dysfunction   . History of colonoscopy   . DDD (degenerative disc disease)   . Fracture, spinal   . Carotid artery occlusion     Past Surgical History  Procedure Laterality Date  . Thyroglossal duct cyst      1943, 1944, 1973  . Chronically infected lymph node removed  1973    History   Social History  . Marital Status: Divorced    Spouse Name: N/A    Number of Children: 3  . Years of Education: N/A   Occupational History  . Retired-homemaker    Social History Main Topics  . Smoking status: Former Smoker -- 3.00 packs/day for 30 years    Types: Cigarettes    Quit date: 01/17/1985  . Smokeless tobacco: Never Used     Comment: previous 80 pack year history  . Alcohol Use: No  . Drug Use: No  . Sexually Active: Not on file   Other Topics Concern  . Not on file    Social History Narrative  . No narrative on file    ROS: no fevers or chills, productive cough, hemoptysis, dysphasia, odynophagia, melena, hematochezia, dysuria, hematuria, rash, seizure activity, orthopnea, PND, pedal edema, claudication. Remaining systems are negative.  Physical Exam: Well-developed well-nourished in no acute distress.  Skin is warm and dry.  HEENT is normal.  Neck is supple. Right carotid bruit Chest with diminished breath sounds throughout Cardiovascular exam is regular rate and rhythm.  Abdominal exam nontender or distended. No masses palpated. Extremities show trace edema. neuro grossly intact  ECG sinus rhythm at a rate of 65. No ST changes.

## 2012-08-23 NOTE — Assessment & Plan Note (Signed)
Continue aspirin.Repeat carotid Dopplers. 

## 2012-08-23 NOTE — Assessment & Plan Note (Signed)
Patient is having some dizziness in the morning after taking her medications and her blood pressure is running low. Change HCTZ to 12.5 mg daily and decrease myocardis to 40 mg daily. Follow blood pressure and adjust regimen as needed.

## 2012-08-28 ENCOUNTER — Encounter (HOSPITAL_COMMUNITY)
Admission: RE | Admit: 2012-08-28 | Discharge: 2012-08-28 | Disposition: A | Discharge status: Home / Self Care | Payer: Self-pay | Source: Ambulatory Visit | Attending: Pulmonary Disease | Admitting: Pulmonary Disease

## 2012-08-30 ENCOUNTER — Encounter (HOSPITAL_COMMUNITY)
Admission: RE | Admit: 2012-08-30 | Discharge: 2012-08-30 | Disposition: A | Discharge status: Home / Self Care | Payer: Self-pay | Source: Ambulatory Visit | Attending: Pulmonary Disease | Admitting: Pulmonary Disease

## 2012-09-04 ENCOUNTER — Encounter (HOSPITAL_COMMUNITY)
Admission: RE | Admit: 2012-09-04 | Discharge: 2012-09-04 | Disposition: A | Discharge status: Home / Self Care | Payer: Self-pay | Source: Ambulatory Visit | Attending: Pulmonary Disease | Admitting: Pulmonary Disease

## 2012-09-06 ENCOUNTER — Encounter (HOSPITAL_COMMUNITY)
Admission: RE | Admit: 2012-09-06 | Discharge: 2012-09-06 | Disposition: A | Discharge status: Home / Self Care | Payer: Self-pay | Source: Ambulatory Visit | Attending: Pulmonary Disease | Admitting: Pulmonary Disease

## 2012-09-11 ENCOUNTER — Encounter (HOSPITAL_COMMUNITY)
Admission: RE | Admit: 2012-09-11 | Discharge: 2012-09-11 | Disposition: A | Discharge status: Home / Self Care | Payer: Self-pay | Source: Ambulatory Visit | Attending: Pulmonary Disease | Admitting: Pulmonary Disease

## 2012-09-12 ENCOUNTER — Encounter (INDEPENDENT_AMBULATORY_CARE_PROVIDER_SITE_OTHER): Payer: Medicare Other

## 2012-09-12 DIAGNOSIS — I6529 Occlusion and stenosis of unspecified carotid artery: Secondary | ICD-10-CM

## 2012-09-13 ENCOUNTER — Telehealth: Payer: Self-pay | Admitting: *Deleted

## 2012-09-13 ENCOUNTER — Encounter (HOSPITAL_COMMUNITY)
Admission: RE | Admit: 2012-09-13 | Discharge: 2012-09-13 | Disposition: A | Discharge status: Home / Self Care | Payer: Self-pay | Source: Ambulatory Visit | Attending: Pulmonary Disease | Admitting: Pulmonary Disease

## 2012-09-13 MED ORDER — TELMISARTAN 80 MG PO TABS: 40.0000 mg | ORAL_TABLET | Freq: Two times a day (BID) | ORAL | Status: DC

## 2012-09-13 NOTE — Telephone Encounter (Signed)
Spoke with pt, she wanted to report her dizziness is gone, but her bp is running 130 to 150 first thing in the morning. She would like to start taking the micardis 1/2 tablet twice daily to see if that helps with the bp first thing in the morning. Okay given for pt to try that. She will cont to track her bp and let us know how she is doing.

## 2012-09-18 ENCOUNTER — Encounter (HOSPITAL_COMMUNITY)
Admission: RE | Admit: 2012-09-18 | Discharge: 2012-09-18 | Disposition: A | Discharge status: Home / Self Care | Payer: Self-pay | Source: Ambulatory Visit | Attending: Pulmonary Disease | Admitting: Pulmonary Disease

## 2012-09-18 DIAGNOSIS — Z23 Encounter for immunization: Secondary | ICD-10-CM | POA: Diagnosis not present

## 2012-09-18 DIAGNOSIS — Z5189 Encounter for other specified aftercare: Secondary | ICD-10-CM | POA: Insufficient documentation

## 2012-09-18 DIAGNOSIS — J449 Chronic obstructive pulmonary disease, unspecified: Secondary | ICD-10-CM | POA: Insufficient documentation

## 2012-09-18 DIAGNOSIS — J4489 Other specified chronic obstructive pulmonary disease: Secondary | ICD-10-CM | POA: Insufficient documentation

## 2012-09-20 ENCOUNTER — Encounter (HOSPITAL_COMMUNITY)
Admission: RE | Admit: 2012-09-20 | Discharge: 2012-09-20 | Disposition: A | Discharge status: Home / Self Care | Payer: Self-pay | Source: Ambulatory Visit | Attending: Pulmonary Disease | Admitting: Pulmonary Disease

## 2012-09-25 ENCOUNTER — Encounter (HOSPITAL_COMMUNITY)
Admission: RE | Admit: 2012-09-25 | Discharge: 2012-09-25 | Disposition: A | Discharge status: Home / Self Care | Payer: Self-pay | Source: Ambulatory Visit | Attending: Pulmonary Disease | Admitting: Pulmonary Disease

## 2012-09-27 ENCOUNTER — Encounter (HOSPITAL_COMMUNITY)
Admission: RE | Admit: 2012-09-27 | Discharge: 2012-09-27 | Disposition: A | Discharge status: Home / Self Care | Payer: Self-pay | Source: Ambulatory Visit | Attending: Pulmonary Disease | Admitting: Pulmonary Disease

## 2012-09-28 ENCOUNTER — Ambulatory Visit (INDEPENDENT_AMBULATORY_CARE_PROVIDER_SITE_OTHER): Payer: Medicare Other | Admitting: Pulmonary Disease

## 2012-09-28 ENCOUNTER — Encounter: Payer: Self-pay | Admitting: Pulmonary Disease

## 2012-09-28 VITALS — BP 112/80 | HR 62 | Temp 98.4°F | Ht 64.0 in | Wt 200.8 lb

## 2012-09-28 DIAGNOSIS — J438 Other emphysema: Secondary | ICD-10-CM

## 2012-09-28 DIAGNOSIS — J439 Emphysema, unspecified: Secondary | ICD-10-CM

## 2012-09-28 NOTE — Assessment & Plan Note (Signed)
The pt has had an excellent response to LABA/LAMA combo, but is concerned about the cost.  Will try her on anoro to consolidate into one inhaler.  She has also done very well in pulmonary rehab, and I have asked her to continue and work on weight loss.

## 2012-09-28 NOTE — Progress Notes (Signed)
  Subjective:    Patient ID: Melissa Ferrell, female    DOB: 12-22-1935, 77 y.o.   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  She continues to do well on her current bronchodilator regimen, and has participated in pulmonary rehabilitation with excellent results.  She denies any worsening of her breathing, or any recent pulmonary infection.  She is concerned about cost of her medication.   Review of Systems  Constitutional: Negative for fever and unexpected weight change.  HENT: Negative for ear pain, nosebleeds, congestion, sore throat, rhinorrhea, sneezing, trouble swallowing, dental problem, postnasal drip and sinus pressure.   Eyes: Negative for redness and itching.  Respiratory: Negative for cough, chest tightness, shortness of breath and wheezing.   Cardiovascular: Negative for palpitations and leg swelling.  Gastrointestinal: Negative for nausea and vomiting.  Genitourinary: Negative for dysuria.  Musculoskeletal: Negative for joint swelling.  Skin: Negative for rash.  Neurological: Negative for headaches.  Hematological: Does not bruise/bleed easily.  Psychiatric/Behavioral: Negative for dysphoric mood. The patient is not nervous/anxious.        Objective:   Physical Exam Overweight female in no acute distress Nose without purulence or discharge noted Neck without lymphadenopathy or thyromegaly Chest with decreased breath sounds, no wheezing Heart exam with regular rate and rhythm Lower extremities with 2+ edema and varicosities, no cyanosis Alert and oriented, moves all 4 extremities.       Assessment & Plan:

## 2012-09-28 NOTE — Patient Instructions (Addendum)
Will try anoro one inhalation each am in the place of your spiriva/arcapta to try and reduce expense.  Check on the cost. Keep up the good work with your exercise program.  followup with me in 6mos if doing well.

## 2012-10-02 ENCOUNTER — Encounter (HOSPITAL_COMMUNITY)
Admission: RE | Admit: 2012-10-02 | Discharge: 2012-10-02 | Disposition: A | Discharge status: Home / Self Care | Payer: Self-pay | Source: Ambulatory Visit | Attending: Pulmonary Disease | Admitting: Pulmonary Disease

## 2012-10-04 ENCOUNTER — Encounter (HOSPITAL_COMMUNITY)
Admission: RE | Admit: 2012-10-04 | Discharge: 2012-10-04 | Disposition: A | Discharge status: Home / Self Care | Payer: Self-pay | Source: Ambulatory Visit | Attending: Pulmonary Disease | Admitting: Pulmonary Disease

## 2012-10-04 ENCOUNTER — Other Ambulatory Visit: Payer: Self-pay | Admitting: Family

## 2012-10-09 ENCOUNTER — Encounter (HOSPITAL_COMMUNITY)
Admission: RE | Admit: 2012-10-09 | Discharge: 2012-10-09 | Disposition: A | Discharge status: Home / Self Care | Payer: Self-pay | Source: Ambulatory Visit | Attending: Pulmonary Disease | Admitting: Pulmonary Disease

## 2012-10-10 ENCOUNTER — Telehealth: Payer: Self-pay | Admitting: Pulmonary Disease

## 2012-10-10 NOTE — Telephone Encounter (Signed)
I spoke with pt. She is calling with update on Anoro. She stated she has finished 1 box of anoro of the sample giving to her on 09/28/12. She finished off the spiriva as directed. Did not take the arcapta. She reports since she has been using the anoro she has had to use her rescue inhaler almost daily in the evening times. She skipped one day of taking this medication and did not have to use her rescue inhaler. Also she reports since being on anoro she had a headache daily as well. She went to pulm rehab the other day and her BP was also elevated. She still has 2 boxes of the anoro left and wants to know if she needs to finish the samples up or if Dr. Shelle Iron wants to change her to something different. Please advise KC thanks  Allergies  Allergen Reactions  . Azithromycin   . Bee Venom   . Benzocaine   . Cefdinir   . Ciprofloxacin   . Diphenhydramine Hcl   . Indomethacin   . Lasix [Furosemide]     Low tolerance    . Other     Adhesive tape  . Paba Derivatives   . Penicillins   . Prednisone     Low tolerance   . Statins     Low tolerance    . Zetia [Ezetimibe]     Low tolerance

## 2012-10-10 NOTE — Telephone Encounter (Signed)
I spoke with pt. If she is being changed to something else then wants sample to try first. She will be in rehab in the AM from 8:30 AM-11AM if we call her during that time then leave a message and she will return call back. I advised pt will do so. Pt is fine with a call back in the AM bc she is not going to take the anoro anyways. Please advise KC thanks

## 2012-10-10 NOTE — Telephone Encounter (Signed)
Since she didn't take arcapta, and can't tolerate anoro, she should just go back to spiriva once a day.  Let us know if having breathing issues.

## 2012-10-11 ENCOUNTER — Encounter (HOSPITAL_COMMUNITY)
Admission: RE | Admit: 2012-10-11 | Discharge: 2012-10-11 | Disposition: A | Discharge status: Home / Self Care | Payer: Self-pay | Source: Ambulatory Visit | Attending: Pulmonary Disease | Admitting: Pulmonary Disease

## 2012-10-11 MED ORDER — TIOTROPIUM BROMIDE MONOHYDRATE 18 MCG IN CAPS: ORAL_CAPSULE | RESPIRATORY_TRACT | Status: DC

## 2012-10-11 NOTE — Telephone Encounter (Signed)
Spoke with patient; aware of recommendations from [facility]; needs Rx for Spiriva sent to pharmacy. Aware that I have sent Rx electronically.

## 2012-10-11 NOTE — Telephone Encounter (Signed)
LMTCB

## 2012-10-16 ENCOUNTER — Encounter (HOSPITAL_COMMUNITY)
Admission: RE | Admit: 2012-10-16 | Discharge: 2012-10-16 | Disposition: A | Discharge status: Home / Self Care | Payer: Self-pay | Source: Ambulatory Visit | Attending: Pulmonary Disease | Admitting: Pulmonary Disease

## 2012-10-18 ENCOUNTER — Encounter (HOSPITAL_COMMUNITY)
Admission: RE | Admit: 2012-10-18 | Discharge: 2012-10-18 | Disposition: A | Discharge status: Home / Self Care | Payer: Self-pay | Source: Ambulatory Visit | Attending: Pulmonary Disease | Admitting: Pulmonary Disease

## 2012-10-18 DIAGNOSIS — Z5189 Encounter for other specified aftercare: Secondary | ICD-10-CM | POA: Insufficient documentation

## 2012-10-18 DIAGNOSIS — J4489 Other specified chronic obstructive pulmonary disease: Secondary | ICD-10-CM | POA: Insufficient documentation

## 2012-10-18 DIAGNOSIS — J449 Chronic obstructive pulmonary disease, unspecified: Secondary | ICD-10-CM | POA: Insufficient documentation

## 2012-10-22 ENCOUNTER — Other Ambulatory Visit: Payer: Self-pay | Admitting: Family

## 2012-10-23 ENCOUNTER — Encounter (HOSPITAL_COMMUNITY)
Admission: RE | Admit: 2012-10-23 | Discharge: 2012-10-23 | Disposition: A | Discharge status: Home / Self Care | Payer: Self-pay | Source: Ambulatory Visit | Attending: Pulmonary Disease | Admitting: Pulmonary Disease

## 2012-10-25 ENCOUNTER — Encounter (HOSPITAL_COMMUNITY)
Admission: RE | Admit: 2012-10-25 | Discharge: 2012-10-25 | Disposition: A | Discharge status: Home / Self Care | Payer: Self-pay | Source: Ambulatory Visit | Attending: Pulmonary Disease | Admitting: Pulmonary Disease

## 2012-10-30 ENCOUNTER — Encounter (HOSPITAL_COMMUNITY)
Admission: RE | Admit: 2012-10-30 | Discharge: 2012-10-30 | Disposition: A | Discharge status: Home / Self Care | Payer: Self-pay | Source: Ambulatory Visit | Attending: Pulmonary Disease | Admitting: Pulmonary Disease

## 2012-10-31 ENCOUNTER — Other Ambulatory Visit: Payer: Self-pay | Admitting: Cardiology

## 2012-11-01 ENCOUNTER — Encounter (HOSPITAL_COMMUNITY)
Admission: RE | Admit: 2012-11-01 | Discharge: 2012-11-01 | Disposition: A | Discharge status: Home / Self Care | Payer: Self-pay | Source: Ambulatory Visit | Attending: Pulmonary Disease | Admitting: Pulmonary Disease

## 2012-11-06 ENCOUNTER — Encounter (HOSPITAL_COMMUNITY)
Admission: RE | Admit: 2012-11-06 | Discharge: 2012-11-06 | Disposition: A | Discharge status: Home / Self Care | Payer: Self-pay | Source: Ambulatory Visit | Attending: Pulmonary Disease | Admitting: Pulmonary Disease

## 2012-11-08 ENCOUNTER — Encounter (HOSPITAL_COMMUNITY)
Admission: RE | Admit: 2012-11-08 | Discharge: 2012-11-08 | Disposition: A | Discharge status: Home / Self Care | Payer: Self-pay | Source: Ambulatory Visit | Attending: Pulmonary Disease | Admitting: Pulmonary Disease

## 2012-11-13 ENCOUNTER — Encounter (HOSPITAL_COMMUNITY)
Admission: RE | Admit: 2012-11-13 | Discharge: 2012-11-13 | Disposition: A | Discharge status: Home / Self Care | Payer: Self-pay | Source: Ambulatory Visit | Attending: Pulmonary Disease | Admitting: Pulmonary Disease

## 2012-11-15 ENCOUNTER — Encounter (HOSPITAL_COMMUNITY)
Admission: RE | Admit: 2012-11-15 | Discharge: 2012-11-15 | Disposition: A | Discharge status: Home / Self Care | Payer: Self-pay | Source: Ambulatory Visit | Attending: Pulmonary Disease | Admitting: Pulmonary Disease

## 2012-11-20 ENCOUNTER — Encounter (HOSPITAL_COMMUNITY): Payer: Medicare Other

## 2012-11-20 DIAGNOSIS — H52209 Unspecified astigmatism, unspecified eye: Secondary | ICD-10-CM | POA: Diagnosis not present

## 2012-11-20 DIAGNOSIS — H2510 Age-related nuclear cataract, unspecified eye: Secondary | ICD-10-CM | POA: Diagnosis not present

## 2012-11-20 DIAGNOSIS — J4489 Other specified chronic obstructive pulmonary disease: Secondary | ICD-10-CM | POA: Insufficient documentation

## 2012-11-20 DIAGNOSIS — J449 Chronic obstructive pulmonary disease, unspecified: Secondary | ICD-10-CM | POA: Insufficient documentation

## 2012-11-20 DIAGNOSIS — Z5189 Encounter for other specified aftercare: Secondary | ICD-10-CM | POA: Insufficient documentation

## 2012-11-22 ENCOUNTER — Encounter (HOSPITAL_COMMUNITY)
Admission: RE | Admit: 2012-11-22 | Discharge: 2012-11-22 | Disposition: A | Discharge status: Home / Self Care | Payer: Self-pay | Source: Ambulatory Visit | Attending: Pulmonary Disease | Admitting: Pulmonary Disease

## 2012-11-27 ENCOUNTER — Encounter (HOSPITAL_COMMUNITY)
Admission: RE | Admit: 2012-11-27 | Discharge: 2012-11-27 | Disposition: A | Discharge status: Home / Self Care | Payer: Self-pay | Source: Ambulatory Visit | Attending: Pulmonary Disease | Admitting: Pulmonary Disease

## 2012-11-29 ENCOUNTER — Encounter (HOSPITAL_COMMUNITY)
Admission: RE | Admit: 2012-11-29 | Discharge: 2012-11-29 | Disposition: A | Discharge status: Home / Self Care | Payer: Self-pay | Source: Ambulatory Visit | Attending: Pulmonary Disease | Admitting: Pulmonary Disease

## 2012-12-04 ENCOUNTER — Encounter (HOSPITAL_COMMUNITY)
Admission: RE | Admit: 2012-12-04 | Discharge: 2012-12-04 | Disposition: A | Discharge status: Home / Self Care | Payer: Self-pay | Source: Ambulatory Visit | Attending: Pulmonary Disease | Admitting: Pulmonary Disease

## 2012-12-06 ENCOUNTER — Encounter (HOSPITAL_COMMUNITY)
Admission: RE | Admit: 2012-12-06 | Discharge: 2012-12-06 | Disposition: A | Discharge status: Home / Self Care | Payer: Self-pay | Source: Ambulatory Visit | Attending: Pulmonary Disease | Admitting: Pulmonary Disease

## 2012-12-11 ENCOUNTER — Encounter (HOSPITAL_COMMUNITY)
Admission: RE | Admit: 2012-12-11 | Discharge: 2012-12-11 | Disposition: A | Discharge status: Home / Self Care | Payer: Self-pay | Source: Ambulatory Visit | Attending: Pulmonary Disease | Admitting: Pulmonary Disease

## 2012-12-13 ENCOUNTER — Encounter (HOSPITAL_COMMUNITY): Payer: Medicare Other

## 2012-12-18 ENCOUNTER — Other Ambulatory Visit: Payer: Self-pay | Admitting: Family

## 2012-12-18 ENCOUNTER — Encounter (HOSPITAL_COMMUNITY): Payer: Medicare Other

## 2012-12-18 ENCOUNTER — Other Ambulatory Visit: Payer: Self-pay | Admitting: Pulmonary Disease

## 2012-12-18 DIAGNOSIS — J4489 Other specified chronic obstructive pulmonary disease: Secondary | ICD-10-CM | POA: Insufficient documentation

## 2012-12-18 DIAGNOSIS — Z5189 Encounter for other specified aftercare: Secondary | ICD-10-CM | POA: Insufficient documentation

## 2012-12-18 DIAGNOSIS — J449 Chronic obstructive pulmonary disease, unspecified: Secondary | ICD-10-CM | POA: Insufficient documentation

## 2012-12-20 ENCOUNTER — Encounter (HOSPITAL_COMMUNITY)
Admission: RE | Admit: 2012-12-20 | Discharge: 2012-12-20 | Disposition: A | Discharge status: Home / Self Care | Payer: Self-pay | Source: Ambulatory Visit | Attending: Pulmonary Disease | Admitting: Pulmonary Disease

## 2012-12-20 ENCOUNTER — Encounter: Payer: Self-pay | Admitting: Pulmonary Disease

## 2012-12-20 NOTE — Telephone Encounter (Signed)
According to phone note 10/11/12. Pt did not start the arcapta. Per KC from same note: Barbaraann Share, MD at 10/10/2012 5:53 PM     Status: Signed        Since she didn't take arcapta, and can't tolerate anoro, she should just go back to spiriva once a day. Let us know if having breathing issues   I called and spoke with pt. She reports she did take the arcapta and has been taking this medication. Pt is wanting a refill on this medication. Please advise KC thanks

## 2012-12-21 ENCOUNTER — Telehealth: Payer: Self-pay | Admitting: Pulmonary Disease

## 2012-12-21 MED ORDER — INDACATEROL MALEATE 75 MCG IN CAPS: ORAL_CAPSULE | RESPIRATORY_TRACT | Status: DC

## 2012-12-21 NOTE — Telephone Encounter (Signed)
Ok to call in arcapta one inhalation daily, but should also stay on spiriva as well.

## 2012-12-21 NOTE — Telephone Encounter (Signed)
Pt aware that I have sent Rx for Arcapta to pharmacy on file and she is aware to stay on Spiriva as well.

## 2012-12-25 ENCOUNTER — Encounter (HOSPITAL_COMMUNITY)
Admission: RE | Admit: 2012-12-25 | Discharge: 2012-12-25 | Disposition: A | Discharge status: Home / Self Care | Payer: Self-pay | Source: Ambulatory Visit | Attending: Pulmonary Disease | Admitting: Pulmonary Disease

## 2012-12-26 ENCOUNTER — Other Ambulatory Visit: Payer: Self-pay | Admitting: Dermatology

## 2012-12-26 DIAGNOSIS — L819 Disorder of pigmentation, unspecified: Secondary | ICD-10-CM | POA: Diagnosis not present

## 2012-12-26 DIAGNOSIS — L723 Sebaceous cyst: Secondary | ICD-10-CM | POA: Diagnosis not present

## 2012-12-26 DIAGNOSIS — D485 Neoplasm of uncertain behavior of skin: Secondary | ICD-10-CM | POA: Diagnosis not present

## 2012-12-26 DIAGNOSIS — L821 Other seborrheic keratosis: Secondary | ICD-10-CM | POA: Diagnosis not present

## 2012-12-26 DIAGNOSIS — L565 Disseminated superficial actinic porokeratosis (DSAP): Secondary | ICD-10-CM | POA: Diagnosis not present

## 2012-12-26 DIAGNOSIS — D1801 Hemangioma of skin and subcutaneous tissue: Secondary | ICD-10-CM | POA: Diagnosis not present

## 2012-12-26 DIAGNOSIS — L259 Unspecified contact dermatitis, unspecified cause: Secondary | ICD-10-CM | POA: Diagnosis not present

## 2012-12-26 DIAGNOSIS — Q828 Other specified congenital malformations of skin: Secondary | ICD-10-CM | POA: Diagnosis not present

## 2012-12-26 DIAGNOSIS — L909 Atrophic disorder of skin, unspecified: Secondary | ICD-10-CM | POA: Diagnosis not present

## 2012-12-27 ENCOUNTER — Encounter (HOSPITAL_COMMUNITY)
Admission: RE | Admit: 2012-12-27 | Discharge: 2012-12-27 | Disposition: A | Discharge status: Home / Self Care | Payer: Self-pay | Source: Ambulatory Visit | Attending: Pulmonary Disease | Admitting: Pulmonary Disease

## 2013-01-01 ENCOUNTER — Encounter (HOSPITAL_COMMUNITY)
Admission: RE | Admit: 2013-01-01 | Discharge: 2013-01-01 | Disposition: A | Discharge status: Home / Self Care | Payer: Self-pay | Source: Ambulatory Visit | Attending: Pulmonary Disease | Admitting: Pulmonary Disease

## 2013-01-03 ENCOUNTER — Encounter (HOSPITAL_COMMUNITY)
Admission: RE | Admit: 2013-01-03 | Discharge: 2013-01-03 | Disposition: A | Discharge status: Home / Self Care | Payer: Self-pay | Source: Ambulatory Visit | Attending: Pulmonary Disease | Admitting: Pulmonary Disease

## 2013-01-08 ENCOUNTER — Encounter (HOSPITAL_COMMUNITY)
Admission: RE | Admit: 2013-01-08 | Discharge: 2013-01-08 | Disposition: A | Discharge status: Home / Self Care | Payer: Self-pay | Source: Ambulatory Visit | Attending: Pulmonary Disease | Admitting: Pulmonary Disease

## 2013-01-10 ENCOUNTER — Encounter (HOSPITAL_COMMUNITY): Payer: Medicare Other

## 2013-01-15 ENCOUNTER — Encounter (HOSPITAL_COMMUNITY)
Admission: RE | Admit: 2013-01-15 | Discharge: 2013-01-15 | Disposition: A | Discharge status: Home / Self Care | Payer: Self-pay | Source: Ambulatory Visit | Attending: Pulmonary Disease | Admitting: Pulmonary Disease

## 2013-01-17 ENCOUNTER — Encounter (HOSPITAL_COMMUNITY): Payer: Self-pay

## 2013-01-17 DIAGNOSIS — J449 Chronic obstructive pulmonary disease, unspecified: Secondary | ICD-10-CM | POA: Insufficient documentation

## 2013-01-17 DIAGNOSIS — J4489 Other specified chronic obstructive pulmonary disease: Secondary | ICD-10-CM | POA: Insufficient documentation

## 2013-01-17 DIAGNOSIS — Z5189 Encounter for other specified aftercare: Secondary | ICD-10-CM | POA: Insufficient documentation

## 2013-01-18 ENCOUNTER — Other Ambulatory Visit: Payer: Self-pay | Admitting: Family

## 2013-01-22 ENCOUNTER — Encounter (HOSPITAL_COMMUNITY)
Admission: RE | Admit: 2013-01-22 | Discharge: 2013-01-22 | Disposition: A | Discharge status: Home / Self Care | Payer: Self-pay | Source: Ambulatory Visit | Attending: Pulmonary Disease | Admitting: Pulmonary Disease

## 2013-01-23 ENCOUNTER — Encounter: Payer: Medicare Other | Admitting: Family

## 2013-01-24 ENCOUNTER — Encounter (HOSPITAL_COMMUNITY)
Admission: RE | Admit: 2013-01-24 | Discharge: 2013-01-24 | Disposition: A | Discharge status: Home / Self Care | Payer: Self-pay | Source: Ambulatory Visit | Attending: Pulmonary Disease | Admitting: Pulmonary Disease

## 2013-01-24 ENCOUNTER — Other Ambulatory Visit: Payer: Self-pay | Admitting: Family

## 2013-01-24 ENCOUNTER — Ambulatory Visit: Payer: Medicare Other | Admitting: Family

## 2013-01-25 ENCOUNTER — Encounter: Payer: Medicare Other | Admitting: Family

## 2013-01-29 ENCOUNTER — Encounter (HOSPITAL_COMMUNITY): Payer: Self-pay

## 2013-01-29 ENCOUNTER — Ambulatory Visit (INDEPENDENT_AMBULATORY_CARE_PROVIDER_SITE_OTHER): Payer: Medicare Other | Admitting: Family

## 2013-01-29 ENCOUNTER — Encounter: Payer: Self-pay | Admitting: Family

## 2013-01-29 VITALS — BP 122/72 | HR 65 | Ht 63.0 in | Wt 199.0 lb

## 2013-01-29 DIAGNOSIS — I10 Essential (primary) hypertension: Secondary | ICD-10-CM | POA: Diagnosis not present

## 2013-01-29 DIAGNOSIS — J449 Chronic obstructive pulmonary disease, unspecified: Secondary | ICD-10-CM

## 2013-01-29 DIAGNOSIS — Z79899 Other long term (current) drug therapy: Secondary | ICD-10-CM | POA: Diagnosis not present

## 2013-01-29 DIAGNOSIS — Z1231 Encounter for screening mammogram for malignant neoplasm of breast: Secondary | ICD-10-CM

## 2013-01-29 DIAGNOSIS — F411 Generalized anxiety disorder: Secondary | ICD-10-CM

## 2013-01-29 DIAGNOSIS — Z0000 Encounter for general adult medical examination without abnormal findings: Secondary | ICD-10-CM

## 2013-01-29 LAB — LIPID PANEL
CHOL/HDL RATIO: 4
CHOLESTEROL: 238 mg/dL — AB (ref 0–200)
HDL: 56.1 mg/dL (ref >39.00)
Triglycerides: 97 mg/dL (ref 0.0–149.0)
VLDL: 19.4 mg/dL (ref 0.0–40.0)

## 2013-01-29 LAB — BASIC METABOLIC PANEL
BUN: 23 mg/dL (ref 6–23)
CHLORIDE: 106 meq/L (ref 96–112)
CO2: 28 mEq/L (ref 19–32)
Calcium: 9.3 mg/dL (ref 8.4–10.5)
Creatinine, Ser: 1 mg/dL (ref 0.4–1.2)
GFR: 56.71 mL/min — ABNORMAL LOW (ref >60.00)
Glucose, Bld: 102 mg/dL — ABNORMAL HIGH (ref 70–99)
POTASSIUM: 3.9 meq/L (ref 3.5–5.1)
Sodium: 141 mEq/L (ref 135–145)

## 2013-01-29 LAB — POCT URINALYSIS DIPSTICK
BILIRUBIN UA: NEGATIVE
GLUCOSE UA: NEGATIVE
Ketones, UA: NEGATIVE
NITRITE UA: NEGATIVE
Protein, UA: NEGATIVE
RBC UA: NEGATIVE
Spec Grav, UA: <=1.005
Urobilinogen, UA: 0.2
pH, UA: 5.5

## 2013-01-29 LAB — LDL CHOLESTEROL, DIRECT: Direct LDL: 158.2 mg/dL

## 2013-01-29 LAB — CBC WITH DIFFERENTIAL/PLATELET
BASOS PCT: 0.4 % (ref 0.0–3.0)
Basophils Absolute: 0 10*3/uL (ref 0.0–0.1)
EOS ABS: 0.2 10*3/uL (ref 0.0–0.7)
Eosinophils Relative: 2.2 % (ref 0.0–5.0)
HCT: 40.8 % (ref 36.0–46.0)
Hemoglobin: 14 g/dL (ref 12.0–15.0)
Lymphocytes Relative: 16.5 % (ref 12.0–46.0)
Lymphs Abs: 1.2 10*3/uL (ref 0.7–4.0)
MCHC: 34.2 g/dL (ref 30.0–36.0)
MCV: 93.5 fl (ref 78.0–100.0)
MONO ABS: 0.5 10*3/uL (ref 0.1–1.0)
Monocytes Relative: 6.7 % (ref 3.0–12.0)
NEUTROS PCT: 74.2 % (ref 43.0–77.0)
Neutro Abs: 5.2 10*3/uL (ref 1.4–7.7)
PLATELETS: 238 10*3/uL (ref 150.0–400.0)
RBC: 4.37 Mil/uL (ref 3.87–5.11)
RDW: 13.7 % (ref 11.5–14.6)
WBC: 7.1 10*3/uL (ref 4.5–10.5)

## 2013-01-29 LAB — TSH: TSH: 1.04 u[IU]/mL (ref 0.35–5.50)

## 2013-01-29 LAB — HEPATIC FUNCTION PANEL
ALK PHOS: 37 U/L — AB (ref 39–117)
ALT: 25 U/L (ref 0–35)
AST: 24 U/L (ref 0–37)
Albumin: 3.9 g/dL (ref 3.5–5.2)
BILIRUBIN DIRECT: 0.1 mg/dL (ref 0.0–0.3)
BILIRUBIN TOTAL: 0.8 mg/dL (ref 0.3–1.2)
Total Protein: 7.1 g/dL (ref 6.0–8.3)

## 2013-01-29 MED ORDER — TELMISARTAN 80 MG PO TABS: 80.0000 mg | ORAL_TABLET | Freq: Every day | ORAL | Status: DC

## 2013-01-29 NOTE — Progress Notes (Signed)
Subjective:    Patient ID: Melissa Ferrell, female    DOB: [birth year], 78 y.o.   MRN: [id number]  HPI 78 year old WF, nonsmoker, is a routine physical examination for this healthy  Female. Reviewed all health maintenance protocols including mammography colonoscopy bone density and reviewed appropriate screening labs. Her immunization history was reviewed as well as her current medications and allergies refills of her chronic medications were given and the plan for yearly health maintenance was discussed all orders and referrals were made as appropriate.   Review of Systems  Constitutional: Negative.   HENT: Negative.   Eyes: Negative.   Respiratory: Negative.   Cardiovascular: Negative.   Gastrointestinal: Negative.   Endocrine: Negative.   Genitourinary: Negative.   Musculoskeletal: Negative.   Skin: Negative.   Allergic/Immunologic: Negative.   Neurological: Negative.   Hematological: Negative.   Psychiatric/Behavioral: Positive for agitation. The patient is nervous/anxious.    Past Medical History  Diagnosis Date  . HTN (hypertension)   . Hyperlipidemia   . IBS (irritable bowel syndrome)   . PUD (peptic ulcer disease)     secondary to NSAID-1975  . Depression   . Scarlet fever   . Emphysema   . Osteoporosis   . GERD (gastroesophageal reflux disease)   . Hx of adenomatous colonic polyps   . Anxiety   . Hemorrhoids   . Migraine headache   . Rheumatoid arthritis(714.0)   . Diastolic dysfunction   . History of colonoscopy   . DDD (degenerative disc disease)   . Fracture, spinal   . Carotid artery occlusion     History   Social History  . Marital Status: Divorced    Spouse Name: N/A    Number of Children: 3  . Years of Education: N/A   Occupational History  . Retired-homemaker    Social History Main Topics  . Smoking status: Former Smoker -- 3.00 packs/day for 30 years    Types: Cigarettes    Quit date: 01/17/1985  . Smokeless tobacco: Never Used   Comment: previous 80 pack year history  . Alcohol Use: No  . Drug Use: No  . Sexual Activity: Not on file   Other Topics Concern  . Not on file   Social History Narrative  . No narrative on file    Past Surgical History  Procedure Laterality Date  . Thyroglossal duct cyst      1943, 1944, 1973  . Chronically infected lymph node removed  1973    Family History  Problem Relation Age of Onset  . Heart disease Mother     MI  . Hypertension Mother   . Stomach cancer Brother   . Melanoma Brother   . Colon cancer Brother   . Heart disease Brother   . Alcohol abuse    . Heart attack Brother 70  . Allergies Sister   . Breast cancer Maternal Grandmother   . Brain cancer Father     Allergies  Allergen Reactions  . Azithromycin   . Bee Venom   . Benzocaine   . Cefdinir   . Ciprofloxacin   . Diphenhydramine Hcl   . Indomethacin   . Lasix [Furosemide]     Low tolerance    . Other     Adhesive tape  . Paba Derivatives   . Penicillins   . Prednisone     Low tolerance   . Statins     Low tolerance    . Zetia [Ezetimibe]  Low tolerance     Current Outpatient Prescriptions on File Prior to Visit  Medication Sig Dispense Refill  . albuterol (PROAIR HFA) 108 (90 BASE) MCG/ACT inhaler Inhale 2 puffs into the lungs every 6 (six) hours as needed for wheezing.  1 Inhaler  11  . ALPRAZolam (XANAX) 0.25 MG tablet TAKE 1 TABLET BY MOUTH AT BEDTIME AS NEEDED  30 tablet  2  . aspirin 81 MG tablet Take 81 mg by mouth 2 (two) times daily.       . Calcium Carbonate-Vitamin D 600-400 MG-UNIT per tablet Take 1 tablet by mouth 2 (two) times daily.       . carvedilol (COREG) 6.25 MG tablet TAKE 1 TABLET BY MOUTH TWICE DAILY  180 tablet  2  . Flaxseed, Linseed, (FLAXSEED OIL) 1000 MG CAPS Take 1 capsule by mouth daily at 12 noon.       . folic acid (FOLVITE) 1 MG tablet Take 1 mg by mouth daily at 12 noon.       . hydrochlorothiazide (HYDRODIURIL) 25 MG tablet Take 0.5 tablets  (12.5 mg total) by mouth every morning.  90 tablet  0  . hydrochlorothiazide (HYDRODIURIL) 25 MG tablet TAKE 1 TABLET BY MOUTH EVERY MORNING  90 tablet  0  . Indacaterol Maleate (ARCAPTA NEOHALER) 75 MCG CAPS PLACE ONE CAPSULE INTO THE INHALER AND INHALE DAILY  30 capsule  4  . Lecithin 1200 MG CAPS Take 1,200 mg by mouth every morning.       . Magnesium 250 MG TABS Take 1 tablet by mouth every morning.       . Multiple Vitamins-Minerals (CENTRUM SILVER PO) Take 1 tablet by mouth every morning.       . Multiple Vitamins-Minerals (OCUVITE EYE HEALTH FORMULA PO) Take 1 tablet by mouth daily at 12 noon.       . Omega-3 Fatty Acids (FISH OIL) 1200 MG CAPS Take 1 capsule by mouth daily at 12 noon.       . tiotropium (SPIRIVA HANDIHALER) 18 MCG inhalation capsule PLACE 1 CAPSULE INTO THE INHALER EVERY DAY AS DIRECTED  30 capsule  6  . vitamin B-12 (CYANOCOBALAMIN) 500 MCG tablet Take 500 mcg by mouth every morning.        No current facility-administered medications on file prior to visit.    BP 122/72  Pulse 65  Ht 5\' 3"  (1.6 m)  Wt 199 lb (90.266 kg)  BMI 35.26 kg/m2  SpO2 97%chart    Objective:   Physical Exam  Constitutional: She appears well-developed and well-nourished.  HENT:  Head: Normocephalic and atraumatic.  Right Ear: External ear normal.  Left Ear: External ear normal.  Nose: Nose normal.  Mouth/Throat: Oropharynx is clear and moist.  Eyes: Conjunctivae and EOM are normal. Pupils are equal, round, and reactive to light.  Neck: Normal range of motion. Neck supple. No thyromegaly present.  Cardiovascular: Normal rate, regular rhythm and normal heart sounds.   Pulmonary/Chest: Effort normal and breath sounds normal.  Abdominal: Soft. Bowel sounds are normal. She exhibits no distension. There is no tenderness. There is no rebound.  Musculoskeletal: Normal range of motion. She exhibits no edema and no tenderness.  Neurological: She is alert. She has normal reflexes. No cranial  nerve deficit. Coordination normal.  Skin: Skin is warm and dry.  Psychiatric: She has a normal mood and affect.          Assessment & Plan:  Assessment: 1. CPX 2. COPD 3. Depression 4.  Osteoporosis 5. Anxiety  Plan: Lab sent to include BMP, CBC, LFTs, lipid, UA, TSH will notify patient of results. Mammogram screening ordered. Encouraged a healthy diet, exercise. Schedule an additional office visit to address her concerns.

## 2013-01-29 NOTE — Patient Instructions (Signed)

## 2013-01-31 ENCOUNTER — Telehealth: Payer: Self-pay | Admitting: Family

## 2013-01-31 ENCOUNTER — Encounter (HOSPITAL_COMMUNITY)
Admission: RE | Admit: 2013-01-31 | Discharge: 2013-01-31 | Disposition: A | Discharge status: Home / Self Care | Payer: Self-pay | Source: Ambulatory Visit | Attending: Pulmonary Disease | Admitting: Pulmonary Disease

## 2013-01-31 NOTE — Telephone Encounter (Signed)
Pt would like referral to a vascular surgeon, Dr Donnetta Hutching, [phone] is preferred by pt..  This is for vein that has opened up in foot, pt cannot wear regular shoes,

## 2013-02-01 NOTE — Telephone Encounter (Signed)
NEEDS OV

## 2013-02-01 NOTE — Telephone Encounter (Signed)
Please advise 

## 2013-02-01 NOTE — Telephone Encounter (Signed)
appt sch

## 2013-02-01 NOTE — Telephone Encounter (Addendum)
Pt states she saw padonda for her cpe and mentioned this issue. Will make another appt if she needs to. Pls advise

## 2013-02-01 NOTE — Telephone Encounter (Signed)
Needs OV.  

## 2013-02-01 NOTE — Telephone Encounter (Signed)
We need to see her foot

## 2013-02-04 ENCOUNTER — Ambulatory Visit (INDEPENDENT_AMBULATORY_CARE_PROVIDER_SITE_OTHER): Payer: Medicare Other | Admitting: Family

## 2013-02-04 ENCOUNTER — Encounter: Payer: Self-pay | Admitting: Family

## 2013-02-04 VITALS — BP 126/80 | HR 76 | Ht 63.0 in | Wt 199.0 lb

## 2013-02-04 DIAGNOSIS — M79605 Pain in left leg: Secondary | ICD-10-CM

## 2013-02-04 DIAGNOSIS — F411 Generalized anxiety disorder: Secondary | ICD-10-CM | POA: Diagnosis not present

## 2013-02-04 DIAGNOSIS — G609 Hereditary and idiopathic neuropathy, unspecified: Secondary | ICD-10-CM

## 2013-02-04 DIAGNOSIS — M79604 Pain in right leg: Secondary | ICD-10-CM

## 2013-02-04 DIAGNOSIS — M79609 Pain in unspecified limb: Secondary | ICD-10-CM | POA: Diagnosis not present

## 2013-02-04 DIAGNOSIS — I8390 Asymptomatic varicose veins of unspecified lower extremity: Secondary | ICD-10-CM

## 2013-02-04 DIAGNOSIS — G629 Polyneuropathy, unspecified: Secondary | ICD-10-CM

## 2013-02-04 DIAGNOSIS — R4701 Aphasia: Secondary | ICD-10-CM | POA: Diagnosis not present

## 2013-02-04 MED ORDER — ALPRAZOLAM 0.25 MG PO TABS: ORAL_TABLET | ORAL | Status: DC

## 2013-02-04 NOTE — Progress Notes (Signed)
Pre visit review using our clinic review tool, if applicable. No additional management support is needed unless otherwise documented below in the visit note. 

## 2013-02-04 NOTE — Patient Instructions (Signed)

## 2013-02-04 NOTE — Progress Notes (Signed)
Subjective:    Patient ID: Melissa Ferrell, female    DOB: 29-Jul-1935, 78 y.o.   MRN: [id number]  HPI 78 year old white female, nonsmoker in today with concerns of worsening bilateral leg pain. She is requesting to be referred to a vascular surgeon for further management. The pain is worse with walking. She is exquisitely tender to touch. It is better when she wear support hose. She rates the pain as 6-7/10, described as achy.  Patient also has concerns of expressive aphasia ongoing x2 years. She is requesting to be referred to neurology for further management. Reports she often forgets which is about the same the middle of a sentence. She has been trying to improve her symptoms by doing crossword puzzles. She's grown more concerned about the possibility of dimension due to family history in her mother.   Review of Systems  Constitutional: Negative.   HENT: Negative.   Respiratory: Negative.   Cardiovascular: Negative.   Gastrointestinal: Negative.   Endocrine: Negative.   Genitourinary: Negative.   Musculoskeletal:       Bilateral leg pain  Skin: Negative.   Neurological: Negative.  Negative for dizziness, facial asymmetry, weakness and numbness.       Expressive aphasia  Hematological: Negative.   Psychiatric/Behavioral: Negative.    Past Medical History  Diagnosis Date  . HTN (hypertension)   . Hyperlipidemia   . IBS (irritable bowel syndrome)   . PUD (peptic ulcer disease)     secondary to NSAID-1975  . Depression   . Scarlet fever   . Emphysema   . Osteoporosis   . GERD (gastroesophageal reflux disease)   . Hx of adenomatous colonic polyps   . Anxiety   . Hemorrhoids   . Migraine headache   . Rheumatoid arthritis(714.0)   . Diastolic dysfunction   . History of colonoscopy   . DDD (degenerative disc disease)   . Fracture, spinal   . Carotid artery occlusion     History   Social History  . Marital Status: Divorced    Spouse Name: N/A    Number of Children:  3  . Years of Education: N/A   Occupational History  . Retired-homemaker    Social History Main Topics  . Smoking status: Former Smoker -- 3.00 packs/day for 30 years    Types: Cigarettes    Quit date: 01/17/1985  . Smokeless tobacco: Never Used     Comment: previous 80 pack year history  . Alcohol Use: No  . Drug Use: No  . Sexual Activity: Not on file   Other Topics Concern  . Not on file   Social History Narrative  . No narrative on file    Past Surgical History  Procedure Laterality Date  . Thyroglossal duct cyst      1943, 1944, 1973  . Chronically infected lymph node removed  1973    Family History  Problem Relation Age of Onset  . Heart disease Mother     MI  . Hypertension Mother   . Stomach cancer Brother   . Melanoma Brother   . Colon cancer Brother   . Heart disease Brother   . Alcohol abuse    . Heart attack Brother 2  . Allergies Sister   . Breast cancer Maternal Grandmother   . Brain cancer Father     Allergies  Allergen Reactions  . Azithromycin   . Bee Venom   . Benzocaine   . Cefdinir   . Ciprofloxacin   .  Diphenhydramine Hcl   . Indomethacin   . Lasix [Furosemide]     Low tolerance    . Other     Adhesive tape  . Paba Derivatives   . Penicillins   . Prednisone     Low tolerance   . Statins     Low tolerance    . Zetia [Ezetimibe]     Low tolerance     Current Outpatient Prescriptions on File Prior to Visit  Medication Sig Dispense Refill  . albuterol (PROAIR HFA) 108 (90 BASE) MCG/ACT inhaler Inhale 2 puffs into the lungs every 6 (six) hours as needed for wheezing.  1 Inhaler  11  . aspirin 81 MG tablet Take 81 mg by mouth 2 (two) times daily.       . Calcium Carbonate-Vitamin D 600-400 MG-UNIT per tablet Take 1 tablet by mouth 2 (two) times daily.       . carvedilol (COREG) 6.25 MG tablet TAKE 1 TABLET BY MOUTH TWICE DAILY  180 tablet  2  . Flaxseed, Linseed, (FLAXSEED OIL) 1000 MG CAPS Take 1 capsule by mouth daily at  12 noon.       . folic acid (FOLVITE) 1 MG tablet Take 1 mg by mouth daily at 12 noon.       . hydrochlorothiazide (HYDRODIURIL) 25 MG tablet Take 0.5 tablets (12.5 mg total) by mouth every morning.  90 tablet  0  . hydrochlorothiazide (HYDRODIURIL) 25 MG tablet TAKE 1 TABLET BY MOUTH EVERY MORNING  90 tablet  0  . Indacaterol Maleate (ARCAPTA NEOHALER) 75 MCG CAPS PLACE ONE CAPSULE INTO THE INHALER AND INHALE DAILY  30 capsule  4  . Lecithin 1200 MG CAPS Take 1,200 mg by mouth every morning.       . Magnesium 250 MG TABS Take 1 tablet by mouth every morning.       . Multiple Vitamins-Minerals (CENTRUM SILVER PO) Take 1 tablet by mouth every morning.       . Multiple Vitamins-Minerals (OCUVITE EYE HEALTH FORMULA PO) Take 1 tablet by mouth daily at 12 noon.       . mupirocin ointment (BACTROBAN) 2 %       . Omega-3 Fatty Acids (FISH OIL) 1200 MG CAPS Take 1 capsule by mouth daily at 12 noon.       Marland Kitchen telmisartan (MICARDIS) 80 MG tablet Take 1 tablet (80 mg total) by mouth daily.  90 tablet  3  . tiotropium (SPIRIVA HANDIHALER) 18 MCG inhalation capsule PLACE 1 CAPSULE INTO THE INHALER EVERY DAY AS DIRECTED  30 capsule  6  . triamcinolone cream (KENALOG) 0.1 %       . vitamin B-12 (CYANOCOBALAMIN) 500 MCG tablet Take 500 mcg by mouth every morning.        No current facility-administered medications on file prior to visit.    BP 126/80  Pulse 76  Ht 5\' 3"  (1.6 m)  Wt 199 lb (90.266 kg)  BMI 35.26 kg/m2chart    Objective:   Physical Exam  Constitutional: She is oriented to person, place, and time. She appears well-developed and well-nourished.  HENT:  Right Ear: External ear normal.  Nose: Nose normal.  Mouth/Throat: Oropharynx is clear and moist.  Neck: Normal range of motion. Neck supple.  Cardiovascular: Normal rate, regular rhythm and normal heart sounds.   Pulmonary/Chest: Effort normal and breath sounds normal.  Abdominal: Soft. Bowel sounds are normal.  Musculoskeletal: Normal  range of motion.  Neurological: She is alert  and oriented to person, place, and time.  Skin: Skin is warm and dry.  Spider veins noted bilaterally  Psychiatric: She has a normal mood and affect.          Assessment & Plan:  Assessment: 1. Bilateral leg pain likely related to venous insufficiency. Refer to vascular. Consider large from a Doppler. Support hose bilaterally daily. 2. Expressive aphasia refer to neurology for further management. 3. Anxiety refilled alprazolam to be taken as needed only.

## 2013-02-05 ENCOUNTER — Encounter (HOSPITAL_COMMUNITY)
Admission: RE | Admit: 2013-02-05 | Discharge: 2013-02-05 | Disposition: A | Discharge status: Home / Self Care | Payer: Self-pay | Source: Ambulatory Visit | Attending: Pulmonary Disease | Admitting: Pulmonary Disease

## 2013-02-07 ENCOUNTER — Encounter: Payer: Self-pay | Admitting: Vascular Surgery

## 2013-02-07 ENCOUNTER — Encounter (HOSPITAL_COMMUNITY)
Admission: RE | Admit: 2013-02-07 | Discharge: 2013-02-07 | Disposition: A | Discharge status: Home / Self Care | Payer: Self-pay | Source: Ambulatory Visit | Attending: Pulmonary Disease | Admitting: Pulmonary Disease

## 2013-02-07 ENCOUNTER — Other Ambulatory Visit: Payer: Self-pay | Admitting: *Deleted

## 2013-02-07 DIAGNOSIS — I83893 Varicose veins of bilateral lower extremities with other complications: Secondary | ICD-10-CM

## 2013-02-12 ENCOUNTER — Encounter (HOSPITAL_COMMUNITY)
Admission: RE | Admit: 2013-02-12 | Discharge: 2013-02-12 | Disposition: A | Discharge status: Home / Self Care | Payer: Self-pay | Source: Ambulatory Visit | Attending: Pulmonary Disease | Admitting: Pulmonary Disease

## 2013-02-14 ENCOUNTER — Encounter (HOSPITAL_COMMUNITY)
Admission: RE | Admit: 2013-02-14 | Discharge: 2013-02-14 | Disposition: A | Discharge status: Home / Self Care | Payer: Self-pay | Source: Ambulatory Visit | Attending: Pulmonary Disease | Admitting: Pulmonary Disease

## 2013-02-19 ENCOUNTER — Encounter (HOSPITAL_COMMUNITY): Payer: Medicare Other | Attending: Pulmonary Disease

## 2013-02-19 DIAGNOSIS — J449 Chronic obstructive pulmonary disease, unspecified: Secondary | ICD-10-CM | POA: Insufficient documentation

## 2013-02-19 DIAGNOSIS — Z5189 Encounter for other specified aftercare: Secondary | ICD-10-CM | POA: Insufficient documentation

## 2013-02-19 DIAGNOSIS — J4489 Other specified chronic obstructive pulmonary disease: Secondary | ICD-10-CM | POA: Insufficient documentation

## 2013-02-21 ENCOUNTER — Encounter (HOSPITAL_COMMUNITY): Payer: Self-pay

## 2013-02-21 ENCOUNTER — Encounter: Payer: Self-pay | Admitting: Family

## 2013-02-26 ENCOUNTER — Encounter (HOSPITAL_COMMUNITY): Payer: Self-pay

## 2013-02-28 ENCOUNTER — Encounter (HOSPITAL_COMMUNITY): Payer: Self-pay

## 2013-03-04 ENCOUNTER — Encounter: Payer: Self-pay | Admitting: Vascular Surgery

## 2013-03-05 ENCOUNTER — Encounter: Payer: Self-pay | Admitting: Vascular Surgery

## 2013-03-05 ENCOUNTER — Encounter (HOSPITAL_COMMUNITY): Payer: Self-pay

## 2013-03-06 ENCOUNTER — Ambulatory Visit: Payer: Medicare Other | Admitting: Neurology

## 2013-03-06 ENCOUNTER — Encounter: Payer: Self-pay | Admitting: Vascular Surgery

## 2013-03-06 ENCOUNTER — Ambulatory Visit (INDEPENDENT_AMBULATORY_CARE_PROVIDER_SITE_OTHER): Payer: Medicare Other | Admitting: Vascular Surgery

## 2013-03-06 ENCOUNTER — Ambulatory Visit (HOSPITAL_COMMUNITY)
Admission: RE | Admit: 2013-03-06 | Discharge: 2013-03-06 | Disposition: A | Discharge status: Home / Self Care | Payer: Medicare Other | Source: Ambulatory Visit | Attending: Vascular Surgery | Admitting: Vascular Surgery

## 2013-03-06 VITALS — BP 148/71 | HR 71 | Ht 63.0 in | Wt 201.0 lb

## 2013-03-06 DIAGNOSIS — I83893 Varicose veins of bilateral lower extremities with other complications: Secondary | ICD-10-CM | POA: Diagnosis not present

## 2013-03-06 DIAGNOSIS — I8390 Asymptomatic varicose veins of unspecified lower extremity: Secondary | ICD-10-CM | POA: Insufficient documentation

## 2013-03-06 NOTE — Progress Notes (Signed)
[facility]  Patient name: Melissa Ferrell MRN: [id number] DOB: Mar 05, 1935 Sex: female  REASON FOR CONSULT: Varicose veins  HPI: Melissa Ferrell is a 78 y.o. female who has had some leg pain. She participates in pulmonary rehabilitation because of a history of pulmonary fibrosis it was felt that she may have some venous insufficiency. She was sent for vascular consultation. She has had a long history of spider veins bilaterally and also leg swelling. She does state that she had one bleeding episode in January of 2014 when she pulled up a small scab from her right foot had significant bleeding from a varicose vein. She denies any aching pain throbbing or heaviness in her lower extremities.  She denies any previous history of DVT or phlebitis.   Past Medical History  Diagnosis Date  . HTN (hypertension)   . Hyperlipidemia   . IBS (irritable bowel syndrome)   . PUD (peptic ulcer disease)     secondary to NSAID-1975  . Depression   . Scarlet fever   . Emphysema   . Osteoporosis   . GERD (gastroesophageal reflux disease)   . Hx of adenomatous colonic polyps   . Anxiety   . Hemorrhoids   . Migraine headache   . Rheumatoid arthritis(714.0)   . Diastolic dysfunction   . History of colonoscopy   . DDD (degenerative disc disease)   . Fracture, spinal   . Carotid artery occlusion    Family History  Problem Relation Age of Onset  . Heart disease Mother     MI  . Hypertension Mother   . Heart attack Mother   . Stomach cancer Brother   . Cancer Brother   . Heart disease Brother   . Hypertension Brother   . Heart attack Brother   . AAA (abdominal aortic aneurysm) Brother   . Aneurysm Brother     brain  . Melanoma Brother   . Colon cancer Brother   . Heart disease Brother   . Alcohol abuse    . Heart attack Brother 20  . Allergies Sister   . Hypertension Sister   . Breast cancer Maternal Grandmother   . Brain cancer Father   . Cancer Father     SOCIAL HISTORY: History  Substance Use Topics  . Smoking status: Former Smoker -- 3.00 packs/day for 30 years    Types: Cigarettes    Quit date: 01/17/1985  . Smokeless tobacco: Never Used     Comment: previous 80 pack year history  . Alcohol Use: Yes     Comment: occ   REVIEW OF SYSTEMS: Valu.Nieves ] denotes positive finding; [  ] denotes negative finding  CARDIOVASCULAR:  [ ]  chest pain   Valu.Nieves ] chest pressure   [ ]  palpitations   [ ]  orthopnea   Valu.Nieves ] dyspnea on exertion   [ ]  claudication   [ ]  rest pain   [ ]  DVT   [ ]  phlebitis PULMONARY:   [ ]  productive cough   [ ]  asthma   Valu.Nieves ] wheezing NEUROLOGIC:   [ ]  weakness  [ ]  paresthesias  [ ]  aphasia  [ ]  amaurosis  [ ]  dizziness HEMATOLOGIC:   [ ]  bleeding problems   [ ]  clotting disorders MUSCULOSKELETAL:  [ ]  joint pain   [ ]  joint swelling Valu.Nieves ] leg swelling GASTROINTESTINAL: [ ]   blood in stool  [ ]   hematemesis GENITOURINARY:  [ ]   dysuria  [ ]   hematuria PSYCHIATRIC:  Valu.Nieves ] history of major depression INTEGUMENTARY:  Valu.Nieves ] rashes  [ ]  ulcers CONSTITUTIONAL:  [ ]  fever   [ ]  chills  PHYSICAL EXAM: Filed Vitals:   03/06/13 1424  BP: 148/71  Pulse: 71  Height: 5\' 3"  (1.6 m)  Weight: 201 lb (91.173 kg)  SpO2: 96%   Body mass index is 35.61 kg/(m^2). GENERAL: The patient is a well-nourished female, in no acute distress. The vital signs are documented above. CARDIOVASCULAR: There is a regular rate and rhythm. She has a right carotid bruit. I cannot palpate femoral popliteal or pedal pulses. However, I was able to obtain biphasic Doppler signals in the dorsalis pedis and posterior tibial positions of both lower extremities. She has mild bilateral lower extremity swelling. PULMONARY: There is good air exchange bilaterally without wheezing or rales. ABDOMEN: Soft and non-tender with normal pitched bowel sounds.  MUSCULOSKELETAL: There are no major deformities or cyanosis. NEUROLOGIC: No focal weakness or paresthesias are detected. SKIN:  She has spider veins and telangiectasias bilaterally. There is no evidence of active phlebitis. PSYCHIATRIC: The patient has a normal affect.  DATA:  I have independently interpreted her venous duplex scan. On the right side, she has evidence of deep vein reflux in the common femoral vein and proximal femoral vein. There is no evidence of DVT. She has reflux in the right greater saphenous vein and also the right lesser saphenous vein.  On the left side she has no evidence of DVT. She does have deep vein reflux in the common femoral vein. She has reflux in the left greater saphenous vein and left lesser saphenous vein.  MEDICAL ISSUES:  Varicose veins of lower extremities with other complications This patient has deep venous insufficiency bilaterally and also reflux in both greater saphenous veins and lesser saphenous veins. However, she has minimal symptoms and at this point I would not recommend considering laser ablation of her saphenous veins. If she develops significant symptoms we could try her in thigh-high compression stockings and if this were not successful consider her for laser ablation. However she has multiple medical comorbidities and I would favor a conservative approach if at all possible. We have discussed the importance of intermittent leg elevation and the proper positioning for this. For now I have recommended knee-high stockings with a mild pressure gradient. We have also discussed what her aerobics which I think is very helpful for patients with venous disease. I've encouraged her to walk as much as possible which she does in her pulmonary rehabilitation. I have encouraged her to avoid prolonged sitting and standing. We have also discussed how to address bleeding if she were to have any bleeding from her varicosities. Specifically I instructed her to elevate her legs and hold focal pressure over the site that his bleeding. I'll see her back at any time if her symptoms  progress.   [facility] [place] Beeper: [phone]

## 2013-03-06 NOTE — Assessment & Plan Note (Signed)
This patient has deep venous insufficiency bilaterally and also reflux in both greater saphenous veins and lesser saphenous veins. However, she has minimal symptoms and at this point I would not recommend considering laser ablation of her saphenous veins. If she develops significant symptoms we could try her in thigh-high compression stockings and if this were not successful consider her for laser ablation. However she has multiple medical comorbidities and I would favor a conservative approach if at all possible. We have discussed the importance of intermittent leg elevation and the proper positioning for this. For now I have recommended knee-high stockings with a mild pressure gradient. We have also discussed what her aerobics which I think is very helpful for patients with venous disease. I've encouraged her to walk as much as possible which she does in her pulmonary rehabilitation. I have encouraged her to avoid prolonged sitting and standing. We have also discussed how to address bleeding if she were to have any bleeding from her varicosities. Specifically I instructed her to elevate her legs and hold focal pressure over the site that his bleeding. I'll see her back at any time if her symptoms progress.

## 2013-03-07 ENCOUNTER — Encounter (HOSPITAL_COMMUNITY): Payer: Self-pay

## 2013-03-12 ENCOUNTER — Encounter (HOSPITAL_COMMUNITY): Payer: Self-pay

## 2013-03-14 ENCOUNTER — Encounter (HOSPITAL_COMMUNITY): Payer: Self-pay

## 2013-03-19 ENCOUNTER — Encounter (HOSPITAL_COMMUNITY): Admission: RE | Admit: 2013-03-19 | Payer: Self-pay | Source: Ambulatory Visit

## 2013-03-21 ENCOUNTER — Encounter (HOSPITAL_COMMUNITY): Payer: Self-pay

## 2013-03-26 ENCOUNTER — Encounter (HOSPITAL_COMMUNITY): Payer: Self-pay

## 2013-03-28 ENCOUNTER — Encounter (HOSPITAL_COMMUNITY): Payer: Self-pay

## 2013-03-29 ENCOUNTER — Ambulatory Visit (INDEPENDENT_AMBULATORY_CARE_PROVIDER_SITE_OTHER): Payer: Medicare Other | Admitting: Pulmonary Disease

## 2013-03-29 ENCOUNTER — Encounter: Payer: Self-pay | Admitting: Pulmonary Disease

## 2013-03-29 VITALS — BP 128/86 | HR 63 | Temp 97.8°F | Ht 64.0 in | Wt 199.4 lb

## 2013-03-29 DIAGNOSIS — J439 Emphysema, unspecified: Secondary | ICD-10-CM

## 2013-03-29 DIAGNOSIS — J438 Other emphysema: Secondary | ICD-10-CM

## 2013-03-29 NOTE — Assessment & Plan Note (Signed)
The patient feels that she does much better with arcapta over to Spiriva, and would like to get by on this alone.   I am okay with this provided that she is not having acute exacerbations or increased rescue inhaler use.  She is also participating in water aerobics, and I've asked her to continue with this, as well as working on weight loss.

## 2013-03-29 NOTE — Progress Notes (Signed)
   Subjective:    Patient ID: Melissa Ferrell, female    DOB: Mar 24, 1935, 78 y.o.   MRN: [id number]  HPI The patient comes in today for followup of her known COPD. She has been altering her prescribed a bronchodilator regimen, and is interested in coming off Spiriva if possible. She has not had an acute exacerbation or pulmonary infection since last visit. She is currently just using arcapta, and feels that controls her symptoms well. She is having some minor URI symptoms, but currently is not interfering with her breathing.   Review of Systems  Constitutional: Negative for fever and unexpected weight change.  HENT: Positive for congestion, ear pain, postnasal drip, rhinorrhea and sore throat. Negative for dental problem, nosebleeds, sinus pressure, sneezing and trouble swallowing.   Eyes: Negative for redness and itching.  Respiratory: Positive for chest tightness. Negative for cough, shortness of breath and wheezing.   Cardiovascular: Negative for palpitations and leg swelling.  Gastrointestinal: Negative for nausea and vomiting.  Genitourinary: Negative for dysuria.  Musculoskeletal: Negative for joint swelling.  Skin: Negative for rash.  Neurological: Negative for headaches.  Hematological: Does not bruise/bleed easily.  Psychiatric/Behavioral: Negative for dysphoric mood. The patient is not nervous/anxious.        Objective:   Physical Exam Obese female in no acute distress Nose without purulence or discharge noted Neck without lymphadenopathy or thyromegaly Chest with totally clear breath sounds, no wheezing Cardiac exam with regular rate and rhythm Lower extremities with 2+ edema, no cyanosis Alert and oriented, moves all 4 extremities.       Assessment & Plan:

## 2013-03-29 NOTE — Patient Instructions (Signed)
Ok to stop spiriva and stay on arcapta alone.  If you notice that your breathing worsens, or if you are requiring albuterol more than 4 times a week, add back spiriva.  Continue in your water aerobic class followup with me again in 67mos

## 2013-04-02 ENCOUNTER — Encounter (HOSPITAL_COMMUNITY): Payer: Self-pay

## 2013-04-02 ENCOUNTER — Telehealth: Payer: Self-pay | Admitting: Family

## 2013-04-02 MED ORDER — DOXYCYCLINE HYCLATE 100 MG PO TABS: 100.0000 mg | ORAL_TABLET | Freq: Two times a day (BID) | ORAL | Status: DC

## 2013-04-02 NOTE — Telephone Encounter (Signed)
Done, this time only.

## 2013-04-02 NOTE — Telephone Encounter (Signed)
Pt aware.

## 2013-04-02 NOTE — Telephone Encounter (Signed)
Please advise 

## 2013-04-02 NOTE — Telephone Encounter (Signed)
Pt states she has a sinus infection and does not want to come in for an appointment due to germs. Wants to see if padonda can call her in something. Pt does have copd, pt is requesting a call back from the nurse if possible.

## 2013-04-03 ENCOUNTER — Ambulatory Visit (INDEPENDENT_AMBULATORY_CARE_PROVIDER_SITE_OTHER): Payer: Medicare Other | Admitting: Neurology

## 2013-04-03 ENCOUNTER — Encounter: Payer: Self-pay | Admitting: Neurology

## 2013-04-03 VITALS — BP 140/82 | HR 74 | Temp 98.2°F | Resp 20 | Ht 64.0 in | Wt 200.1 lb

## 2013-04-03 DIAGNOSIS — E538 Deficiency of other specified B group vitamins: Secondary | ICD-10-CM | POA: Diagnosis not present

## 2013-04-03 DIAGNOSIS — R413 Other amnesia: Secondary | ICD-10-CM | POA: Diagnosis not present

## 2013-04-03 LAB — VITAMIN B12: Vitamin B-12: 1151 pg/mL — ABNORMAL HIGH (ref 211–911)

## 2013-04-03 NOTE — Patient Instructions (Signed)
I do not appreciate any cognitive or memory problems on my testing.  I think the best thing would be to re-evaluate in 6 months.  Even though you take B12, it is a lower dose.  So we will check a level to determine if you should increase the dose.  Follow up in 6 months.

## 2013-04-03 NOTE — Progress Notes (Signed)
NEUROLOGY CONSULTATION NOTE  Melissa Ferrell MRN: [id number] DOB: [birth year]  Referring provider: Roxy Cedar, FNP Primary care provider: Roxy Cedar, FNP  Reason for consult:  Memory problems.  HISTORY OF PRESENT ILLNESS: Melissa Ferrell is a 78 year old right-handed woman with history of CAD, CHF, hypertension, COPD, Rheumatoid arthritis, chronic venous insufficiency, depression and anxiety who presents for memory problems.  Records and images were personally reviewed where available.    She began noticing issues about 3 years ago.  Sometimes, she would be in the middle of a conversation and stop mid-sentence.  She describes it as "half of my brain being erased."  She says she knows what she wants to say but can't get the words out.  Other times, it is related to losing her train of thought.  It lasts only a few seconds.  Also, she has been going to pulmonary rehab since last June.  More recently, she began having trouble remembering the names of the therapists that she sees there, even though she has regularly been going there for over 6 months.  She has played bridge for many years.  Sometimes when she plays bridge, she will forget what is the bid, what cards are played, and how to go forward with her hand.  She drives and does not get disoriented on familiar routes, but she no longer is able to drive to unfamiliar areas.  She lives in a continuing care center.  She is able to manage her finances and perform her ADLs.  No hallucinations or delusions.  She does have anxiety.  She thinks that her mother may have had dementia.  PAST MEDICAL HISTORY: Past Medical History  Diagnosis Date  . HTN (hypertension)   . Hyperlipidemia   . IBS (irritable bowel syndrome)   . PUD (peptic ulcer disease)     secondary to NSAID-1975  . Depression   . Scarlet fever   . Emphysema   . Osteoporosis   . GERD (gastroesophageal reflux disease)   . Hx of adenomatous colonic polyps   . Anxiety    . Hemorrhoids   . Migraine headache   . Rheumatoid arthritis(714.0)   . Diastolic dysfunction   . History of colonoscopy   . DDD (degenerative disc disease)   . Fracture, spinal   . Carotid artery occlusion     PAST SURGICAL HISTORY: Past Surgical History  Procedure Laterality Date  . Thyroglossal duct cyst      1943, 1944, 1973  . Chronically infected lymph node removed  1973    MEDICATIONS: Current Outpatient Prescriptions on File Prior to Visit  Medication Sig Dispense Refill  . albuterol (PROAIR HFA) 108 (90 BASE) MCG/ACT inhaler Inhale 2 puffs into the lungs every 6 (six) hours as needed for wheezing.  1 Inhaler  11  . ALPRAZolam (XANAX) 0.25 MG tablet TAKE 1 TABLET BY MOUTH AT BEDTIME AS NEEDED  30 tablet  2  . aspirin 81 MG tablet Take 81 mg by mouth 2 (two) times daily.       . Calcium Carbonate-Vitamin D 600-400 MG-UNIT per tablet Take 1 tablet by mouth 2 (two) times daily.       . carvedilol (COREG) 6.25 MG tablet TAKE 1 TABLET BY MOUTH TWICE DAILY  180 tablet  2  . doxycycline (VIBRA-TABS) 100 MG tablet Take 1 tablet (100 mg total) by mouth 2 (two) times daily.  20 tablet  0  . Flaxseed, Linseed, (FLAXSEED OIL) 1000 MG CAPS Take 1 capsule by  mouth daily at 12 noon.       . folic acid (FOLVITE) 1 MG tablet Take 1 mg by mouth daily at 12 noon.       . hydrochlorothiazide (HYDRODIURIL) 25 MG tablet TAKE 1 TABLET BY MOUTH EVERY MORNING  90 tablet  0  . Indacaterol Maleate (ARCAPTA NEOHALER) 75 MCG CAPS PLACE ONE CAPSULE INTO THE INHALER AND INHALE DAILY  30 capsule  4  . Lecithin 1200 MG CAPS Take 1,200 mg by mouth every morning.       . Magnesium 250 MG TABS Take 1 tablet by mouth every morning.       . Multiple Vitamins-Minerals (CENTRUM SILVER PO) Take 1 tablet by mouth every morning.       . Multiple Vitamins-Minerals (OCUVITE EYE HEALTH FORMULA PO) Take 1 tablet by mouth daily at 12 noon.       . mupirocin ointment (BACTROBAN) 2 %       . Omega-3 Fatty Acids (FISH  OIL) 1200 MG CAPS Take 1 capsule by mouth daily at 12 noon.       Marland Kitchen telmisartan (MICARDIS) 80 MG tablet Take 1 tablet (80 mg total) by mouth daily.  90 tablet  3  . tiotropium (SPIRIVA HANDIHALER) 18 MCG inhalation capsule PLACE 1 CAPSULE INTO THE INHALER EVERY DAY AS DIRECTED  30 capsule  6  . triamcinolone cream (KENALOG) 0.1 %       . vitamin B-12 (CYANOCOBALAMIN) 500 MCG tablet Take 500 mcg by mouth every morning.        No current facility-administered medications on file prior to visit.    ALLERGIES: Allergies  Allergen Reactions  . Anoro Ellipta [Umeclidinium-Vilanterol]     HEADACHES  . Azithromycin   . Bee Venom   . Benzocaine   . Cefdinir   . Ciprofloxacin   . Diphenhydramine Hcl   . Indomethacin   . Lasix [Furosemide]     Low tolerance    . Other     Adhesive tape  . Paba Derivatives   . Penicillins   . Prednisone     Low tolerance   . Statins     Low tolerance    . Zetia [Ezetimibe]     Low tolerance     FAMILY HISTORY: Family History  Problem Relation Age of Onset  . Heart disease Mother     MI  . Hypertension Mother   . Heart attack Mother   . Stomach cancer Brother   . Cancer Brother   . Heart disease Brother   . Hypertension Brother   . Heart attack Brother   . AAA (abdominal aortic aneurysm) Brother   . Aneurysm Brother     brain  . Melanoma Brother   . Colon cancer Brother   . Heart disease Brother   . Alcohol abuse    . Heart attack Brother 71  . Allergies Sister   . Hypertension Sister   . Breast cancer Maternal Grandmother   . Brain cancer Father   . Cancer Father     SOCIAL HISTORY: History   Social History  . Marital Status: Divorced    Spouse Name: N/A    Number of Children: 3  . Years of Education: N/A   Occupational History  . Retired-homemaker    Social History Main Topics  . Smoking status: Former Smoker -- 3.00 packs/day for 30 years    Types: Cigarettes    Quit date: 01/17/1985  . Smokeless tobacco: Never  Used     Comment: previous 80 pack year history  . Alcohol Use: Yes     Comment: occ  . Drug Use: No  . Sexual Activity: Not on file   Other Topics Concern  . Not on file   Social History Narrative  . No narrative on file    REVIEW OF SYSTEMS: Constitutional: No fevers, chills, or sweats, no generalized fatigue, change in appetite Eyes: No visual changes, double vision, eye pain Ear, nose and throat: No hearing loss, ear pain, nasal congestion, sore throat Cardiovascular: No chest pain, palpitations Respiratory:  No shortness of breath at rest or with exertion, wheezes GastrointestinaI: No nausea, vomiting, diarrhea, abdominal pain, fecal incontinence Genitourinary:  No dysuria, urinary retention or frequency Musculoskeletal:  Back and neck pain Integumentary: No rash, pruritus, skin lesions Neurological: as above Psychiatric: No depression, insomnia, anxiety Endocrine: No palpitations, fatigue, diaphoresis, mood swings, change in appetite, change in weight, increased thirst Hematologic/Lymphatic:  No anemia, purpura, petechiae. Allergic/Immunologic: no itchy/runny eyes, nasal congestion, recent allergic reactions, rashes  PHYSICAL EXAM: Filed Vitals:   04/03/13 1027  BP: 140/82  Pulse: 74  Temp: 98.2 F (36.8 C)  Resp: 20   General: No acute distress Head:  Normocephalic/atraumatic Neck: supple, no paraspinal tenderness, full range of motion Back: No paraspinal tenderness Heart: regular rate and rhythm Lungs: Clear to auscultation bilaterally. Vascular: No carotid bruits. Neurological Exam: Mental status: alert and oriented to person, place, and time, recent and remote memory intact, fund of knowledge intact, attention and concentration intact, speech fluent and not dysarthric, language intact.  Able to complete Trail Making test, copy a cube and draw a clock correctly.  Serial 7 subtraction intact.  Good naming fluency.  Abstraction intact.  MOCA 30/30. Cranial  nerves: CN I: not tested CN II: pupils equal, round and reactive to light, visual fields intact, fundi unremarkable, without vessel changes, exudates, hemorrhages or papilledema. CN III, IV, VI:  full range of motion, no nystagmus, no ptosis CN V: facial sensation intact CN VII: upper and lower face symmetric CN VIII: hearing intact CN IX, X: gag intact, uvula midline CN XI: sternocleidomastoid and trapezius muscles intact CN XII: tongue midline Bulk & Tone: normal, no fasciculations. Motor: 5/5 throughout Sensation: reduced pinprick in the toes.  Vibration intact. Deep Tendon Reflexes: 2+ throughout except absent in the ankles.  Toes down. Finger to nose testing: no dysmetria Heel to shin: no dysmetria Gait: normal stance and stride.  Able to turn.  Some trouble with tandem. Romberg negative.  IMPRESSION: Memory problems.  No evidence of cognitive impairment on my exam.  Will have to monitor.  PLAN: 1.  She takes B12 but 520mcg daily.  Will check a level and if low, will recommend increasing dose of supplement. 2.  Re-evaluation in 6 months.  60 minutes spent with patient, over 50% spent counseling and coordinating care.  Thank you for allowing me to take part in the care of this patient.  Metta Clines, DO  CC:  Roxy Cedar, FNP

## 2013-04-04 ENCOUNTER — Encounter (HOSPITAL_COMMUNITY): Payer: Self-pay

## 2013-04-05 ENCOUNTER — Telehealth: Payer: Self-pay | Admitting: *Deleted

## 2013-04-05 NOTE — Telephone Encounter (Signed)
Patient is aware that labs are normal

## 2013-04-09 ENCOUNTER — Encounter (HOSPITAL_COMMUNITY): Payer: Self-pay

## 2013-04-11 ENCOUNTER — Encounter (HOSPITAL_COMMUNITY): Payer: Self-pay

## 2013-04-16 ENCOUNTER — Encounter (HOSPITAL_COMMUNITY): Payer: Self-pay

## 2013-04-18 ENCOUNTER — Encounter (HOSPITAL_COMMUNITY): Payer: Self-pay

## 2013-04-18 DIAGNOSIS — M25559 Pain in unspecified hip: Secondary | ICD-10-CM | POA: Diagnosis not present

## 2013-04-18 DIAGNOSIS — IMO0002 Reserved for concepts with insufficient information to code with codable children: Secondary | ICD-10-CM | POA: Diagnosis not present

## 2013-04-18 DIAGNOSIS — M5137 Other intervertebral disc degeneration, lumbosacral region: Secondary | ICD-10-CM | POA: Diagnosis not present

## 2013-04-18 DIAGNOSIS — M999 Biomechanical lesion, unspecified: Secondary | ICD-10-CM | POA: Diagnosis not present

## 2013-04-23 ENCOUNTER — Encounter (HOSPITAL_COMMUNITY): Payer: Self-pay

## 2013-04-23 DIAGNOSIS — M999 Biomechanical lesion, unspecified: Secondary | ICD-10-CM | POA: Diagnosis not present

## 2013-04-23 DIAGNOSIS — IMO0002 Reserved for concepts with insufficient information to code with codable children: Secondary | ICD-10-CM | POA: Diagnosis not present

## 2013-04-23 DIAGNOSIS — M5137 Other intervertebral disc degeneration, lumbosacral region: Secondary | ICD-10-CM | POA: Diagnosis not present

## 2013-04-23 DIAGNOSIS — G608 Other hereditary and idiopathic neuropathies: Secondary | ICD-10-CM | POA: Diagnosis not present

## 2013-04-23 DIAGNOSIS — M25559 Pain in unspecified hip: Secondary | ICD-10-CM | POA: Diagnosis not present

## 2013-04-24 DIAGNOSIS — M5137 Other intervertebral disc degeneration, lumbosacral region: Secondary | ICD-10-CM | POA: Diagnosis not present

## 2013-04-24 DIAGNOSIS — M25559 Pain in unspecified hip: Secondary | ICD-10-CM | POA: Diagnosis not present

## 2013-04-24 DIAGNOSIS — M999 Biomechanical lesion, unspecified: Secondary | ICD-10-CM | POA: Diagnosis not present

## 2013-04-24 DIAGNOSIS — IMO0002 Reserved for concepts with insufficient information to code with codable children: Secondary | ICD-10-CM | POA: Diagnosis not present

## 2013-04-24 DIAGNOSIS — G608 Other hereditary and idiopathic neuropathies: Secondary | ICD-10-CM | POA: Diagnosis not present

## 2013-04-25 ENCOUNTER — Encounter (HOSPITAL_COMMUNITY): Payer: Self-pay

## 2013-04-26 DIAGNOSIS — M999 Biomechanical lesion, unspecified: Secondary | ICD-10-CM | POA: Diagnosis not present

## 2013-04-26 DIAGNOSIS — M5137 Other intervertebral disc degeneration, lumbosacral region: Secondary | ICD-10-CM | POA: Diagnosis not present

## 2013-04-26 DIAGNOSIS — G608 Other hereditary and idiopathic neuropathies: Secondary | ICD-10-CM | POA: Diagnosis not present

## 2013-04-26 DIAGNOSIS — IMO0002 Reserved for concepts with insufficient information to code with codable children: Secondary | ICD-10-CM | POA: Diagnosis not present

## 2013-04-26 DIAGNOSIS — M25559 Pain in unspecified hip: Secondary | ICD-10-CM | POA: Diagnosis not present

## 2013-04-29 DIAGNOSIS — M999 Biomechanical lesion, unspecified: Secondary | ICD-10-CM | POA: Diagnosis not present

## 2013-04-29 DIAGNOSIS — M25559 Pain in unspecified hip: Secondary | ICD-10-CM | POA: Diagnosis not present

## 2013-04-29 DIAGNOSIS — IMO0002 Reserved for concepts with insufficient information to code with codable children: Secondary | ICD-10-CM | POA: Diagnosis not present

## 2013-04-29 DIAGNOSIS — G608 Other hereditary and idiopathic neuropathies: Secondary | ICD-10-CM | POA: Diagnosis not present

## 2013-04-29 DIAGNOSIS — M5137 Other intervertebral disc degeneration, lumbosacral region: Secondary | ICD-10-CM | POA: Diagnosis not present

## 2013-04-30 ENCOUNTER — Encounter (HOSPITAL_COMMUNITY): Payer: Self-pay

## 2013-04-30 ENCOUNTER — Other Ambulatory Visit: Payer: Self-pay | Admitting: Family

## 2013-04-30 DIAGNOSIS — M999 Biomechanical lesion, unspecified: Secondary | ICD-10-CM | POA: Diagnosis not present

## 2013-04-30 DIAGNOSIS — IMO0002 Reserved for concepts with insufficient information to code with codable children: Secondary | ICD-10-CM | POA: Diagnosis not present

## 2013-04-30 DIAGNOSIS — M5137 Other intervertebral disc degeneration, lumbosacral region: Secondary | ICD-10-CM | POA: Diagnosis not present

## 2013-04-30 DIAGNOSIS — G608 Other hereditary and idiopathic neuropathies: Secondary | ICD-10-CM | POA: Diagnosis not present

## 2013-04-30 DIAGNOSIS — M25559 Pain in unspecified hip: Secondary | ICD-10-CM | POA: Diagnosis not present

## 2013-05-02 ENCOUNTER — Encounter (HOSPITAL_COMMUNITY): Payer: Self-pay

## 2013-05-02 DIAGNOSIS — M999 Biomechanical lesion, unspecified: Secondary | ICD-10-CM | POA: Diagnosis not present

## 2013-05-02 DIAGNOSIS — G608 Other hereditary and idiopathic neuropathies: Secondary | ICD-10-CM | POA: Diagnosis not present

## 2013-05-02 DIAGNOSIS — M5137 Other intervertebral disc degeneration, lumbosacral region: Secondary | ICD-10-CM | POA: Diagnosis not present

## 2013-05-02 DIAGNOSIS — M25559 Pain in unspecified hip: Secondary | ICD-10-CM | POA: Diagnosis not present

## 2013-05-02 DIAGNOSIS — IMO0002 Reserved for concepts with insufficient information to code with codable children: Secondary | ICD-10-CM | POA: Diagnosis not present

## 2013-05-06 DIAGNOSIS — G608 Other hereditary and idiopathic neuropathies: Secondary | ICD-10-CM | POA: Diagnosis not present

## 2013-05-06 DIAGNOSIS — M5137 Other intervertebral disc degeneration, lumbosacral region: Secondary | ICD-10-CM | POA: Diagnosis not present

## 2013-05-06 DIAGNOSIS — M999 Biomechanical lesion, unspecified: Secondary | ICD-10-CM | POA: Diagnosis not present

## 2013-05-06 DIAGNOSIS — IMO0002 Reserved for concepts with insufficient information to code with codable children: Secondary | ICD-10-CM | POA: Diagnosis not present

## 2013-05-06 DIAGNOSIS — M25559 Pain in unspecified hip: Secondary | ICD-10-CM | POA: Diagnosis not present

## 2013-05-07 ENCOUNTER — Encounter (HOSPITAL_COMMUNITY): Payer: Self-pay

## 2013-05-08 DIAGNOSIS — M25559 Pain in unspecified hip: Secondary | ICD-10-CM | POA: Diagnosis not present

## 2013-05-08 DIAGNOSIS — M5137 Other intervertebral disc degeneration, lumbosacral region: Secondary | ICD-10-CM | POA: Diagnosis not present

## 2013-05-08 DIAGNOSIS — M999 Biomechanical lesion, unspecified: Secondary | ICD-10-CM | POA: Diagnosis not present

## 2013-05-08 DIAGNOSIS — IMO0002 Reserved for concepts with insufficient information to code with codable children: Secondary | ICD-10-CM | POA: Diagnosis not present

## 2013-05-08 DIAGNOSIS — Z23 Encounter for immunization: Secondary | ICD-10-CM | POA: Diagnosis not present

## 2013-05-08 DIAGNOSIS — G608 Other hereditary and idiopathic neuropathies: Secondary | ICD-10-CM | POA: Diagnosis not present

## 2013-05-09 ENCOUNTER — Encounter (HOSPITAL_COMMUNITY): Payer: Self-pay

## 2013-05-09 DIAGNOSIS — M999 Biomechanical lesion, unspecified: Secondary | ICD-10-CM | POA: Diagnosis not present

## 2013-05-09 DIAGNOSIS — IMO0002 Reserved for concepts with insufficient information to code with codable children: Secondary | ICD-10-CM | POA: Diagnosis not present

## 2013-05-09 DIAGNOSIS — M25559 Pain in unspecified hip: Secondary | ICD-10-CM | POA: Diagnosis not present

## 2013-05-09 DIAGNOSIS — M5137 Other intervertebral disc degeneration, lumbosacral region: Secondary | ICD-10-CM | POA: Diagnosis not present

## 2013-05-09 DIAGNOSIS — G608 Other hereditary and idiopathic neuropathies: Secondary | ICD-10-CM | POA: Diagnosis not present

## 2013-05-13 DIAGNOSIS — IMO0002 Reserved for concepts with insufficient information to code with codable children: Secondary | ICD-10-CM | POA: Diagnosis not present

## 2013-05-13 DIAGNOSIS — M25559 Pain in unspecified hip: Secondary | ICD-10-CM | POA: Diagnosis not present

## 2013-05-13 DIAGNOSIS — M5137 Other intervertebral disc degeneration, lumbosacral region: Secondary | ICD-10-CM | POA: Diagnosis not present

## 2013-05-13 DIAGNOSIS — M999 Biomechanical lesion, unspecified: Secondary | ICD-10-CM | POA: Diagnosis not present

## 2013-05-13 DIAGNOSIS — G608 Other hereditary and idiopathic neuropathies: Secondary | ICD-10-CM | POA: Diagnosis not present

## 2013-05-14 ENCOUNTER — Encounter (HOSPITAL_COMMUNITY): Payer: Self-pay

## 2013-05-14 DIAGNOSIS — IMO0002 Reserved for concepts with insufficient information to code with codable children: Secondary | ICD-10-CM | POA: Diagnosis not present

## 2013-05-14 DIAGNOSIS — G608 Other hereditary and idiopathic neuropathies: Secondary | ICD-10-CM | POA: Diagnosis not present

## 2013-05-14 DIAGNOSIS — M5137 Other intervertebral disc degeneration, lumbosacral region: Secondary | ICD-10-CM | POA: Diagnosis not present

## 2013-05-14 DIAGNOSIS — M25559 Pain in unspecified hip: Secondary | ICD-10-CM | POA: Diagnosis not present

## 2013-05-14 DIAGNOSIS — M999 Biomechanical lesion, unspecified: Secondary | ICD-10-CM | POA: Diagnosis not present

## 2013-05-16 ENCOUNTER — Encounter (HOSPITAL_COMMUNITY): Payer: Self-pay

## 2013-05-16 DIAGNOSIS — M999 Biomechanical lesion, unspecified: Secondary | ICD-10-CM | POA: Diagnosis not present

## 2013-05-16 DIAGNOSIS — IMO0002 Reserved for concepts with insufficient information to code with codable children: Secondary | ICD-10-CM | POA: Diagnosis not present

## 2013-05-16 DIAGNOSIS — M5137 Other intervertebral disc degeneration, lumbosacral region: Secondary | ICD-10-CM | POA: Diagnosis not present

## 2013-05-16 DIAGNOSIS — G608 Other hereditary and idiopathic neuropathies: Secondary | ICD-10-CM | POA: Diagnosis not present

## 2013-05-16 DIAGNOSIS — M25559 Pain in unspecified hip: Secondary | ICD-10-CM | POA: Diagnosis not present

## 2013-05-21 ENCOUNTER — Encounter (HOSPITAL_COMMUNITY): Payer: Self-pay

## 2013-05-21 DIAGNOSIS — IMO0002 Reserved for concepts with insufficient information to code with codable children: Secondary | ICD-10-CM | POA: Diagnosis not present

## 2013-05-21 DIAGNOSIS — M25559 Pain in unspecified hip: Secondary | ICD-10-CM | POA: Diagnosis not present

## 2013-05-21 DIAGNOSIS — M999 Biomechanical lesion, unspecified: Secondary | ICD-10-CM | POA: Diagnosis not present

## 2013-05-21 DIAGNOSIS — G608 Other hereditary and idiopathic neuropathies: Secondary | ICD-10-CM | POA: Diagnosis not present

## 2013-05-21 DIAGNOSIS — M5137 Other intervertebral disc degeneration, lumbosacral region: Secondary | ICD-10-CM | POA: Diagnosis not present

## 2013-05-22 ENCOUNTER — Other Ambulatory Visit: Payer: Self-pay | Admitting: Family

## 2013-05-23 ENCOUNTER — Encounter (HOSPITAL_COMMUNITY): Payer: Self-pay

## 2013-05-24 ENCOUNTER — Other Ambulatory Visit: Payer: Self-pay | Admitting: Pulmonary Disease

## 2013-05-24 DIAGNOSIS — G608 Other hereditary and idiopathic neuropathies: Secondary | ICD-10-CM | POA: Diagnosis not present

## 2013-05-24 DIAGNOSIS — M5137 Other intervertebral disc degeneration, lumbosacral region: Secondary | ICD-10-CM | POA: Diagnosis not present

## 2013-05-24 DIAGNOSIS — IMO0002 Reserved for concepts with insufficient information to code with codable children: Secondary | ICD-10-CM | POA: Diagnosis not present

## 2013-05-24 DIAGNOSIS — M999 Biomechanical lesion, unspecified: Secondary | ICD-10-CM | POA: Diagnosis not present

## 2013-05-24 DIAGNOSIS — M25559 Pain in unspecified hip: Secondary | ICD-10-CM | POA: Diagnosis not present

## 2013-05-27 ENCOUNTER — Telehealth: Payer: Self-pay | Admitting: Pulmonary Disease

## 2013-05-27 MED ORDER — INDACATEROL MALEATE 75 MCG IN CAPS: ORAL_CAPSULE | RESPIRATORY_TRACT | Status: DC

## 2013-05-27 NOTE — Telephone Encounter (Signed)
rx has been called in. lmtcb x1

## 2013-05-28 ENCOUNTER — Encounter (HOSPITAL_COMMUNITY): Payer: Self-pay

## 2013-05-28 NOTE — Telephone Encounter (Signed)
Returning a call [phone]

## 2013-05-28 NOTE — Telephone Encounter (Signed)
Pt aware RX called in. Nothing further needed

## 2013-06-17 ENCOUNTER — Telehealth: Payer: Self-pay | Admitting: Cardiology

## 2013-06-17 MED ORDER — CARVEDILOL 12.5 MG PO TABS: 12.5000 mg | ORAL_TABLET | Freq: Two times a day (BID) | ORAL | Status: DC

## 2013-06-17 NOTE — Telephone Encounter (Signed)
New message    patient calling   With blood pressure issues  149/70  Pulse  71. Patient states blood pressure is a concern for her.

## 2013-06-17 NOTE — Telephone Encounter (Signed)
Spoke with pt, Aware of dr crenshaw's recommendations.  °

## 2013-06-17 NOTE — Telephone Encounter (Signed)
Change coreg to 12.5 mg po BID Lelon Perla

## 2013-06-17 NOTE — Telephone Encounter (Signed)
Spoke with pt, for the last 10 days her blood pressure has been 149-133/60-70. She has been having unusual headaches. She is having a lot of pain related to her back Will forward for dr Stanford Breed review

## 2013-07-12 ENCOUNTER — Other Ambulatory Visit: Payer: Self-pay | Admitting: Pulmonary Disease

## 2013-07-31 ENCOUNTER — Ambulatory Visit: Payer: Medicare Other | Admitting: Family

## 2013-08-07 ENCOUNTER — Encounter: Payer: Self-pay | Admitting: Family

## 2013-08-07 ENCOUNTER — Telehealth: Payer: Self-pay | Admitting: Family

## 2013-08-07 ENCOUNTER — Ambulatory Visit (INDEPENDENT_AMBULATORY_CARE_PROVIDER_SITE_OTHER): Payer: Medicare Other | Admitting: Family

## 2013-08-07 VITALS — BP 124/68 | HR 65 | Ht 64.0 in | Wt 197.0 lb

## 2013-08-07 DIAGNOSIS — I10 Essential (primary) hypertension: Secondary | ICD-10-CM

## 2013-08-07 DIAGNOSIS — E782 Mixed hyperlipidemia: Secondary | ICD-10-CM | POA: Diagnosis not present

## 2013-08-07 DIAGNOSIS — K589 Irritable bowel syndrome without diarrhea: Secondary | ICD-10-CM

## 2013-08-07 DIAGNOSIS — J438 Other emphysema: Secondary | ICD-10-CM

## 2013-08-07 DIAGNOSIS — I679 Cerebrovascular disease, unspecified: Secondary | ICD-10-CM

## 2013-08-07 LAB — BASIC METABOLIC PANEL
BUN: 15 mg/dL (ref 6–23)
CALCIUM: 9.8 mg/dL (ref 8.4–10.5)
CHLORIDE: 102 meq/L (ref 96–112)
CO2: 30 mEq/L (ref 19–32)
CREATININE: 1 mg/dL (ref 0.4–1.2)
GFR: 59.33 mL/min — AB (ref >60.00)
Glucose, Bld: 91 mg/dL (ref 70–99)
Potassium: 4.2 mEq/L (ref 3.5–5.1)
Sodium: 140 mEq/L (ref 135–145)

## 2013-08-07 LAB — LIPID PANEL
CHOLESTEROL: 223 mg/dL — AB (ref 0–200)
HDL: 55.9 mg/dL (ref >39.00)
LDL Cholesterol: 149 mg/dL — ABNORMAL HIGH (ref 0–99)
NonHDL: 167.1
TRIGLYCERIDES: 90 mg/dL (ref 0.0–149.0)
Total CHOL/HDL Ratio: 4
VLDL: 18 mg/dL (ref 0.0–40.0)

## 2013-08-07 LAB — HEPATIC FUNCTION PANEL
ALT: 20 U/L (ref 0–35)
AST: 23 U/L (ref 0–37)
Albumin: 4 g/dL (ref 3.5–5.2)
Alkaline Phosphatase: 38 U/L — ABNORMAL LOW (ref 39–117)
BILIRUBIN DIRECT: 0 mg/dL (ref 0.0–0.3)
Total Bilirubin: 0.9 mg/dL (ref 0.2–1.2)
Total Protein: 6.8 g/dL (ref 6.0–8.3)

## 2013-08-07 MED ORDER — ALPRAZOLAM 0.25 MG PO TABS: ORAL_TABLET | ORAL | Status: DC

## 2013-08-07 MED ORDER — HYDROCHLOROTHIAZIDE 25 MG PO TABS: ORAL_TABLET | ORAL | Status: DC

## 2013-08-07 NOTE — Progress Notes (Signed)
Subjective:    Patient ID: Melissa Ferrell, female    DOB: 14-Aug-1935, 78 y.o.   MRN: [id number]  HPI  78 year old white female, nonsmoker with a history of cerebrovascular disease, hypertension, hyperlipidemia, emphysema, depression, is in today for recheck. Reports going through pulmonary rehabilitation, then to water aerobics, then to merely walking at this point has been beneficial. She is continuing to see vascular coronary artery disease in morning support hose daily. She declines mammogram and colonoscopy. She statin intolerant.  Review of Systems  Constitutional: Negative.   Respiratory: Negative.   Cardiovascular: Negative.   Gastrointestinal: Negative.   Endocrine: Negative.   Genitourinary: Negative.   Musculoskeletal: Negative.   Skin: Negative.   Allergic/Immunologic: Negative.   Neurological: Negative.   Psychiatric/Behavioral: Negative.    Past Medical History  Diagnosis Date  . HTN (hypertension)   . Hyperlipidemia   . IBS (irritable bowel syndrome)   . PUD (peptic ulcer disease)     secondary to NSAID-1975  . Depression   . Scarlet fever   . Emphysema   . Osteoporosis   . GERD (gastroesophageal reflux disease)   . Hx of adenomatous colonic polyps   . Anxiety   . Hemorrhoids   . Migraine headache   . Rheumatoid arthritis(714.0)   . Diastolic dysfunction   . History of colonoscopy   . DDD (degenerative disc disease)   . Fracture, spinal   . Carotid artery occlusion     History   Social History  . Marital Status: Divorced    Spouse Name: N/A    Number of Children: 3  . Years of Education: N/A   Occupational History  . Retired-homemaker    Social History Main Topics  . Smoking status: Former Smoker -- 3.00 packs/day for 30 years    Types: Cigarettes    Quit date: 01/17/1985  . Smokeless tobacco: Never Used     Comment: previous 80 pack year history  . Alcohol Use: Yes     Comment: occ  . Drug Use: No  . Sexual Activity: Not on file    Other Topics Concern  . Not on file   Social History Narrative  . No narrative on file    Past Surgical History  Procedure Laterality Date  . Thyroglossal duct cyst      1943, 1944, 1973  . Chronically infected lymph node removed  1973    Family History  Problem Relation Age of Onset  . Heart disease Mother     MI  . Hypertension Mother   . Heart attack Mother   . Stomach cancer Brother   . Cancer Brother   . Heart disease Brother   . Hypertension Brother   . Heart attack Brother   . AAA (abdominal aortic aneurysm) Brother   . Aneurysm Brother     brain  . Melanoma Brother   . Colon cancer Brother   . Heart disease Brother   . Alcohol abuse    . Heart attack Brother 54  . Allergies Sister   . Hypertension Sister   . Breast cancer Maternal Grandmother   . Brain cancer Father   . Cancer Father     Allergies  Allergen Reactions  . Anoro Ellipta [Umeclidinium-Vilanterol]     HEADACHES  . Azithromycin   . Bee Venom   . Benzocaine   . Cefdinir   . Ciprofloxacin   . Diphenhydramine Hcl   . Indomethacin   . Lasix [Furosemide]     Low  tolerance    . Other     Adhesive tape  . Paba Derivatives   . Penicillins   . Prednisone     Low tolerance   . Statins     Low tolerance    . Zetia [Ezetimibe]     Low tolerance     Current Outpatient Prescriptions on File Prior to Visit  Medication Sig Dispense Refill  . aspirin 81 MG tablet Take 81 mg by mouth 2 (two) times daily.       . Calcium Carbonate-Vitamin D 600-400 MG-UNIT per tablet Take 1 tablet by mouth 2 (two) times daily.       . carvedilol (COREG) 12.5 MG tablet Take 1 tablet (12.5 mg total) by mouth 2 (two) times daily with a meal.  180 tablet  3  . doxycycline (VIBRA-TABS) 100 MG tablet Take 1 tablet (100 mg total) by mouth 2 (two) times daily.  20 tablet  0  . Flaxseed, Linseed, (FLAXSEED OIL) 1000 MG CAPS Take 1 capsule by mouth daily at 12 noon.       . folic acid (FOLVITE) 1 MG tablet Take 1  mg by mouth daily at 12 noon.       . Indacaterol Maleate (ARCAPTA NEOHALER) 75 MCG CAPS PLACE ONE CAPSULE INTO THE INHALER AND INHALE DAILY  30 capsule  4  . Lecithin 1200 MG CAPS Take 1,200 mg by mouth every morning.       . Magnesium 250 MG TABS Take 1 tablet by mouth every morning.       . Multiple Vitamins-Minerals (CENTRUM SILVER PO) Take 1 tablet by mouth every morning.       . Multiple Vitamins-Minerals (OCUVITE EYE HEALTH FORMULA PO) Take 1 tablet by mouth daily at 12 noon.       . mupirocin ointment (BACTROBAN) 2 %       . Omega-3 Fatty Acids (FISH OIL) 1200 MG CAPS Take 1 capsule by mouth daily at 12 noon.       Marland Kitchen PROAIR HFA 108 (90 BASE) MCG/ACT inhaler INHALE 2 PUFFS BY MOUTH EVERY 6 HOURS AS NEEDED FOR WHEEZING  8.5 g  6  . telmisartan (MICARDIS) 80 MG tablet Take 1 tablet (80 mg total) by mouth daily.  90 tablet  3  . tiotropium (SPIRIVA HANDIHALER) 18 MCG inhalation capsule PLACE 1 CAPSULE INTO THE INHALER EVERY DAY AS DIRECTED  30 capsule  6  . triamcinolone cream (KENALOG) 0.1 %       . vitamin B-12 (CYANOCOBALAMIN) 500 MCG tablet Take 500 mcg by mouth every morning.        No current facility-administered medications on file prior to visit.    BP 124/68  Pulse 65  Ht 5\' 4"  (1.626 m)  Wt 197 lb (89.359 kg)  BMI 33.80 kg/m2chart    Objective:   Physical Exam  Constitutional: She is oriented to person, place, and time. She appears well-developed and well-nourished.  HENT:  Right Ear: External ear normal.  Left Ear: External ear normal.  Nose: Nose normal.  Mouth/Throat: Oropharynx is clear and moist.  Neck: Normal range of motion. Neck supple.  Cardiovascular: Normal rate, regular rhythm and normal heart sounds.   Pulmonary/Chest: Effort normal and breath sounds normal.  Abdominal: Soft. Bowel sounds are normal.  Musculoskeletal: Normal range of motion. She exhibits no edema.  Neurological: She is alert and oriented to person, place, and time.  Skin: Skin is warm  and dry.  varicose veins bilaterally.  Support hose on.   Psychiatric: She has a normal mood and affect.          Assessment & Plan:  Melissa Ferrell was seen today for no specified reason.  Diagnoses and associated orders for this visit:  Unspecified essential hypertension - Basic Metabolic Panel - Hepatic Function Panel  Mixed hyperlipidemia - Basic Metabolic Panel - Hepatic Function Panel - Lipid Panel  IBS (irritable bowel syndrome) - Basic Metabolic Panel - Hepatic Function Panel  Cerebral vascular disease - Lipid Panel  Other emphysema  Other Orders - hydrochlorothiazide (HYDRODIURIL) 25 MG tablet; TAKE 1 TABLET BY MOUTH EVERY MORNING - ALPRAZolam (XANAX) 0.25 MG tablet; Take .5-1 tab prn   Call the office with any questions or concerns. Recheck in 6 months for CPX and sooner as needed.

## 2013-08-07 NOTE — Patient Instructions (Signed)
Fat and Cholesterol Control Diet  Fat and cholesterol levels in your blood and organs are influenced by your diet. High levels of fat and cholesterol may lead to diseases of the heart, small and large blood vessels, gallbladder, liver, and pancreas.  CONTROLLING FAT AND CHOLESTEROL WITH DIET  Although exercise and lifestyle factors are important, your diet is key. That is because certain foods are known to raise cholesterol and others to lower it. The goal is to balance foods for their effect on cholesterol and more importantly, to replace saturated and trans fat with other types of fat, such as monounsaturated fat, polyunsaturated fat, and omega-3 fatty acids.  On average, a person should consume no more than 15 to 17 g of saturated fat daily. Saturated and trans fats are considered "bad" fats, and they will raise LDL cholesterol. Saturated fats are primarily found in animal products such as meats, butter, and cream. However, that does not mean you need to give up all your favorite foods. Today, there are good tasting, low-fat, low-cholesterol substitutes for most of the things you like to eat. Choose low-fat or nonfat alternatives. Choose round or loin cuts of red meat. These types of cuts are lowest in fat and cholesterol. Chicken (without the skin), fish, veal, and ground turkey breast are great choices. Eliminate fatty meats, such as hot dogs and salami. Even shellfish have little or no saturated fat. Have a 3 oz (85 g) portion when you eat lean meat, poultry, or fish.  Trans fats are also called "partially hydrogenated oils." They are oils that have been scientifically manipulated so that they are solid at room temperature resulting in a longer shelf life and improved taste and texture of foods in which they are added. Trans fats are found in stick margarine, some tub margarines, cookies, crackers, and baked goods.   When baking and cooking, oils are a great substitute for butter. The monounsaturated oils are  especially beneficial since it is believed they lower LDL and raise HDL. The oils you should avoid entirely are saturated tropical oils, such as coconut and palm.   Remember to eat a lot from food groups that are naturally free of saturated and trans fat, including fish, fruit, vegetables, beans, grains (barley, rice, couscous, bulgur wheat), and pasta (without cream sauces).   IDENTIFYING FOODS THAT LOWER FAT AND CHOLESTEROL   Soluble fiber may lower your cholesterol. This type of fiber is found in fruits such as apples, vegetables such as broccoli, potatoes, and carrots, legumes such as beans, peas, and lentils, and grains such as barley. Foods fortified with plant sterols (phytosterol) may also lower cholesterol. You should eat at least 2 g per day of these foods for a cholesterol lowering effect.   Read package labels to identify low-saturated fats, trans fat free, and low-fat foods at the supermarket. Select cheeses that have only 2 to 3 g saturated fat per ounce. Use a heart-healthy tub margarine that is free of trans fats or partially hydrogenated oil. When buying baked goods (cookies, crackers), avoid partially hydrogenated oils. Breads and muffins should be made from whole grains (whole-wheat or whole oat flour, instead of "flour" or "enriched flour"). Buy non-creamy canned soups with reduced salt and no added fats.   FOOD PREPARATION TECHNIQUES   Never deep-fry. If you must fry, either stir-fry, which uses very little fat, or use non-stick cooking sprays. When possible, broil, bake, or roast meats, and steam vegetables. Instead of putting butter or margarine on vegetables, use lemon   and herbs, applesauce, and cinnamon (for squash and sweet potatoes). Use nonfat yogurt, salsa, and low-fat dressings for salads.   LOW-SATURATED FAT / LOW-FAT FOOD SUBSTITUTES  Meats / Saturated Fat (g)  · Avoid: Steak, marbled (3 oz/85 g) / 11 g  · Choose: Steak, lean (3 oz/85 g) / 4 g  · Avoid: Hamburger (3 oz/85 g) / 7  g  · Choose: Hamburger, lean (3 oz/85 g) / 5 g  · Avoid: Ham (3 oz/85 g) / 6 g  · Choose: Ham, lean cut (3 oz/85 g) / 2.4 g  · Avoid: Chicken, with skin, dark meat (3 oz/85 g) / 4 g  · Choose: Chicken, skin removed, dark meat (3 oz/85 g) / 2 g  · Avoid: Chicken, with skin, light meat (3 oz/85 g) / 2.5 g  · Choose: Chicken, skin removed, light meat (3 oz/85 g) / 1 g  Dairy / Saturated Fat (g)  · Avoid: Whole milk (1 cup) / 5 g  · Choose: Low-fat milk, 2% (1 cup) / 3 g  · Choose: Low-fat milk, 1% (1 cup) / 1.5 g  · Choose: Skim milk (1 cup) / 0.3 g  · Avoid: Hard cheese (1 oz/28 g) / 6 g  · Choose: Skim milk cheese (1 oz/28 g) / 2 to 3 g  · Avoid: Cottage cheese, 4% fat (1 cup) / 6.5 g  · Choose: Low-fat cottage cheese, 1% fat (1 cup) / 1.5 g  · Avoid: Ice cream (1 cup) / 9 g  · Choose: Sherbet (1 cup) / 2.5 g  · Choose: Nonfat frozen yogurt (1 cup) / 0.3 g  · Choose: Frozen fruit bar / trace  · Avoid: Whipped cream (1 tbs) / 3.5 g  · Choose: Nondairy whipped topping (1 tbs) / 1 g  Condiments / Saturated Fat (g)  · Avoid: Mayonnaise (1 tbs) / 2 g  · Choose: Low-fat mayonnaise (1 tbs) / 1 g  · Avoid: Butter (1 tbs) / 7 g  · Choose: Extra light margarine (1 tbs) / 1 g  · Avoid: Coconut oil (1 tbs) / 11.8 g  · Choose: Olive oil (1 tbs) / 1.8 g  · Choose: Corn oil (1 tbs) / 1.7 g  · Choose: Safflower oil (1 tbs) / 1.2 g  · Choose: Sunflower oil (1 tbs) / 1.4 g  · Choose: Soybean oil (1 tbs) / 2.4 g  · Choose: Canola oil (1 tbs) / 1 g  Document Released: 01/03/2005 Document Revised: 04/30/2012 Document Reviewed: 06/24/2010  [employer]® Patient Information ©2015 [employer]. This information is not intended to replace advice given to you by your health care provider. Make sure you discuss any questions you have with your health care provider.

## 2013-08-07 NOTE — Telephone Encounter (Signed)
Relevant patient education assigned to patient using Emmi. ° °

## 2013-08-07 NOTE — Progress Notes (Signed)
Pre visit review using our clinic review tool, if applicable. No additional management support is needed unless otherwise documented below in the visit note. 

## 2013-08-29 ENCOUNTER — Encounter: Payer: Self-pay | Admitting: Cardiology

## 2013-08-29 ENCOUNTER — Ambulatory Visit (INDEPENDENT_AMBULATORY_CARE_PROVIDER_SITE_OTHER): Payer: Medicare Other | Admitting: Cardiology

## 2013-08-29 VITALS — BP 140/80 | HR 64 | Ht 64.0 in | Wt 193.3 lb

## 2013-08-29 DIAGNOSIS — I10 Essential (primary) hypertension: Secondary | ICD-10-CM | POA: Diagnosis not present

## 2013-08-29 DIAGNOSIS — I679 Cerebrovascular disease, unspecified: Secondary | ICD-10-CM

## 2013-08-29 DIAGNOSIS — I2510 Atherosclerotic heart disease of native coronary artery without angina pectoris: Secondary | ICD-10-CM | POA: Diagnosis not present

## 2013-08-29 DIAGNOSIS — E785 Hyperlipidemia, unspecified: Secondary | ICD-10-CM

## 2013-08-29 NOTE — Assessment & Plan Note (Signed)
Continue aspirin. Intolerant to statins. Schedule followup carotid Dopplers.

## 2013-08-29 NOTE — Progress Notes (Signed)
HPI: FU congestive heart failure and hypertension. Myoview in May of 2011. Perfusion was normal. The study was not gated. Echocardiogram in August 2013 showed normal LV function. We felt that her dyspnea was most likely secondary to COPD. Carotid Dopplers in August of 2014 showed 0-39% bilateral stenosis and followup recommended in one year. I last saw her in August of 2014. Since then, she has dyspnea on exertion unchanged. No orthopnea, PND, exertional chest pain. Occasional mild pedal edema.   Current Outpatient Prescriptions  Medication Sig Dispense Refill  . ALPRAZolam (XANAX) 0.25 MG tablet Take .5-1 tab prn  30 tablet  3  . aspirin 81 MG tablet Take 81 mg by mouth 2 (two) times daily.       . Calcium Carbonate-Vitamin D 600-400 MG-UNIT per tablet Take 1 tablet by mouth 2 (two) times daily.       . carvedilol (COREG) 12.5 MG tablet Take 1 tablet (12.5 mg total) by mouth 2 (two) times daily with a meal.  180 tablet  3  . Flaxseed, Linseed, (FLAXSEED OIL) 1000 MG CAPS Take 1 capsule by mouth daily at 12 noon.       . folic acid (FOLVITE) 1 MG tablet Take 1 mg by mouth daily at 12 noon.       . hydrochlorothiazide (HYDRODIURIL) 25 MG tablet TAKE 1 TABLET BY MOUTH EVERY MORNING  90 tablet  1  . Indacaterol Maleate (ARCAPTA NEOHALER) 75 MCG CAPS PLACE ONE CAPSULE INTO THE INHALER AND INHALE DAILY  30 capsule  4  . Lecithin 1200 MG CAPS Take 1,200 mg by mouth every morning.       . Magnesium 250 MG TABS Take 1 tablet by mouth every morning.       . Multiple Vitamins-Minerals (CENTRUM SILVER PO) Take 1 tablet by mouth every morning.       . Multiple Vitamins-Minerals (OCUVITE EYE HEALTH FORMULA PO) Take 1 tablet by mouth daily at 12 noon.       . Omega-3 Fatty Acids (FISH OIL) 1200 MG CAPS Take 1 capsule by mouth daily at 12 noon.       . Omega-3 Krill Oil 300 MG CAPS Take by mouth.      Marland Kitchen PROAIR HFA 108 (90 BASE) MCG/ACT inhaler INHALE 2 PUFFS BY MOUTH EVERY 6 HOURS AS NEEDED FOR  WHEEZING  8.5 g  6  . telmisartan (MICARDIS) 80 MG tablet Take 40 mg by mouth 2 (two) times daily.      Marland Kitchen tiotropium (SPIRIVA HANDIHALER) 18 MCG inhalation capsule PLACE 1 CAPSULE INTO THE INHALER EVERY DAY AS DIRECTED  30 capsule  6  . vitamin B-12 (CYANOCOBALAMIN) 500 MCG tablet Take 500 mcg by mouth every morning.        No current facility-administered medications for this visit.     Past Medical History  Diagnosis Date  . HTN (hypertension)   . Hyperlipidemia   . IBS (irritable bowel syndrome)   . PUD (peptic ulcer disease)     secondary to NSAID-1975  . Depression   . Scarlet fever   . Emphysema   . Osteoporosis   . GERD (gastroesophageal reflux disease)   . Hx of adenomatous colonic polyps   . Anxiety   . Hemorrhoids   . Migraine headache   . Rheumatoid arthritis(714.0)   . Diastolic dysfunction   . History of colonoscopy   . DDD (degenerative disc disease)   . Fracture, spinal   . Carotid  artery occlusion     Past Surgical History  Procedure Laterality Date  . Thyroglossal duct cyst      1943, 1944, 1973  . Chronically infected lymph node removed  1973    History   Social History  . Marital Status: Divorced    Spouse Name: N/A    Number of Children: 3  . Years of Education: N/A   Occupational History  . Retired-homemaker    Social History Main Topics  . Smoking status: Former Smoker -- 3.00 packs/day for 30 years    Types: Cigarettes    Quit date: 01/17/1985  . Smokeless tobacco: Never Used     Comment: previous 80 pack year history  . Alcohol Use: Yes     Comment: occ  . Drug Use: No  . Sexual Activity: Not on file   Other Topics Concern  . Not on file   Social History Narrative  . No narrative on file    ROS: no fevers or chills, productive cough, hemoptysis, dysphasia, odynophagia, melena, hematochezia, dysuria, hematuria, rash, seizure activity, orthopnea, PND, pedal edema, claudication. Remaining systems are negative.  Physical  Exam: Well-developed well-nourished in no acute distress.  Skin is warm and dry.  HEENT is normal.  Neck is supple. Right carotid bruit Chest is clear to auscultation with normal expansion.  Cardiovascular exam is regular rate and rhythm.  Abdominal exam nontender or distended. No masses palpated. Extremities show no edema. neuro grossly intact  ECG NSR, cannot R/O prior septal MI

## 2013-08-29 NOTE — Patient Instructions (Signed)
Your physician wants you to follow-up in: ONE YEAR WITH DR CRENSHAW You will receive a reminder letter in the mail two months in advance. If you don't receive a letter, please call our office to schedule the follow-up appointment.  

## 2013-08-29 NOTE — Assessment & Plan Note (Signed)
Intolerant to statins. Continue diet.

## 2013-08-29 NOTE — Assessment & Plan Note (Signed)
Continue present blood pressure medications. 

## 2013-08-30 ENCOUNTER — Other Ambulatory Visit: Payer: Self-pay | Admitting: Pulmonary Disease

## 2013-09-02 ENCOUNTER — Telehealth: Payer: Self-pay | Admitting: Pulmonary Disease

## 2013-09-02 MED ORDER — TIOTROPIUM BROMIDE MONOHYDRATE 18 MCG IN CAPS: ORAL_CAPSULE | RESPIRATORY_TRACT | Status: DC

## 2013-09-02 NOTE — Telephone Encounter (Signed)
Per 03/29/13 OV: Patient Instructions      Ok to stop spiriva and stay on arcapta alone.  If you notice that your breathing worsens, or if you are requiring albuterol more than 4 times a week, add back spiriva.   Continue in your water aerobic class followup with me again in 69mos    --  Called spoke with pt. She reports she was not bale to stop the spiriva bc she was having to use the proair more than 4 times a week. She restarted the praoir back and now uses the proair twice a week. She is still taking her arcapta. Please advise Muir thanks

## 2013-09-02 NOTE — Telephone Encounter (Signed)
Called and spoke with pt and she stated that she will need a refill fo the spirva sent to her pharmacy.  This has been done and pt is aware. Nothing further is needed.

## 2013-09-02 NOTE — Telephone Encounter (Signed)
I sounds like she needs to stay on spiriva for now, and let us know if she is requiring excessive proair use

## 2013-09-03 ENCOUNTER — Encounter: Payer: Self-pay | Admitting: Family

## 2013-09-14 DIAGNOSIS — Z23 Encounter for immunization: Secondary | ICD-10-CM | POA: Diagnosis not present

## 2013-09-17 ENCOUNTER — Other Ambulatory Visit (HOSPITAL_COMMUNITY): Payer: Self-pay | Admitting: Cardiology

## 2013-09-17 ENCOUNTER — Ambulatory Visit (HOSPITAL_COMMUNITY)
Admission: RE | Admit: 2013-09-17 | Discharge: 2013-09-17 | Disposition: A | Discharge status: Home / Self Care | Payer: Medicare Other | Source: Ambulatory Visit | Attending: Cardiology | Admitting: Cardiology

## 2013-09-17 DIAGNOSIS — I658 Occlusion and stenosis of other precerebral arteries: Secondary | ICD-10-CM | POA: Insufficient documentation

## 2013-09-17 DIAGNOSIS — I6529 Occlusion and stenosis of unspecified carotid artery: Secondary | ICD-10-CM | POA: Insufficient documentation

## 2013-09-17 DIAGNOSIS — I6523 Occlusion and stenosis of bilateral carotid arteries: Secondary | ICD-10-CM

## 2013-09-17 NOTE — Progress Notes (Signed)
Carotid Duplex Completed. Sherrel Ploch, BS, RDMS, RVT  

## 2013-09-27 ENCOUNTER — Encounter: Payer: Self-pay | Admitting: Pulmonary Disease

## 2013-09-27 ENCOUNTER — Ambulatory Visit (INDEPENDENT_AMBULATORY_CARE_PROVIDER_SITE_OTHER): Payer: Medicare Other | Admitting: Pulmonary Disease

## 2013-09-27 VITALS — BP 124/76 | HR 62 | Temp 98.3°F | Ht 64.0 in | Wt 193.8 lb

## 2013-09-27 DIAGNOSIS — I2510 Atherosclerotic heart disease of native coronary artery without angina pectoris: Secondary | ICD-10-CM

## 2013-09-27 DIAGNOSIS — J438 Other emphysema: Secondary | ICD-10-CM | POA: Diagnosis not present

## 2013-09-27 DIAGNOSIS — J439 Emphysema, unspecified: Secondary | ICD-10-CM

## 2013-09-27 NOTE — Patient Instructions (Signed)
Try stiolto 2 puffs each am for next 2 weeks to see if you like better than spiriva/arcapta. If you prefer stiolto, let us know and we can send in prescription.  Can still use albuterol if needed for rescue Can give you a flu shot today if you are ok with that. Work on weight loss and conditioning, both of which contribute to your shortness of breath. followup with me again in 3mos.

## 2013-09-27 NOTE — Progress Notes (Signed)
   Subjective:    Patient ID: Melissa Ferrell, female    DOB: 1935-07-16, 78 y.o.   MRN: [id number]  HPI The patient comes in today for followup of her known COPD. She has been staying on Spiriva and arcapta, but has had a very difficult time with the hot humid summer. He currently has no chest congestion, cough or mucus. She feels that her exertional tolerance is not as good as it has been in the past. She is complaining about the effectiveness and cost of her medication today. She has already gotten her flu shot.   Review of Systems  Constitutional: Negative for fever and unexpected weight change.  HENT: Negative for congestion, dental problem, ear pain, nosebleeds, postnasal drip, rhinorrhea, sinus pressure, sneezing, sore throat and trouble swallowing.   Eyes: Negative for redness and itching.  Respiratory: Positive for cough, chest tightness, shortness of breath and wheezing.   Cardiovascular: Positive for leg swelling. Negative for palpitations.  Gastrointestinal: Negative for nausea and vomiting.  Genitourinary: Negative for dysuria.  Musculoskeletal: Negative for joint swelling.  Skin: Negative for rash.  Neurological: Negative for headaches.  Hematological: Does not bruise/bleed easily.  Psychiatric/Behavioral: Negative for dysphoric mood. The patient is not nervous/anxious.        Objective:   Physical Exam Obese female in no acute distress Nose without purulence or discharge noted Neck without lymphadenopathy or thyromegaly Chest with mildly decreased breath sounds, no crackles, wheezes, or rhonchi Cardiac exam with regular rate and rhythm Lower extremities with minimal edema, no cyanosis Alert and oriented, moves all 4 extremities.       Assessment & Plan:

## 2013-09-27 NOTE — Assessment & Plan Note (Signed)
The patient has not done well from a pulmonary standpoint through the hot and humid summer.  She does not sound infected, nor does she have acute bronchospasm to suggest a COPD exacerbation. I have reviewed her level of COPD, and explained that we cannot normalize her breathing, and that her obesity and cardiac issues contribute to her shortness of breath as well. She is not happy with her current medical regimen, and we'll therefore try her on stiolto. I have encouraged her to continue working in pulmonary rehabilitation, and to work on aggressive weight loss. She has already gotten her flu shot today Time spent with the patient today was 36 minutes.

## 2013-10-02 ENCOUNTER — Encounter: Payer: Self-pay | Admitting: Neurology

## 2013-10-02 ENCOUNTER — Ambulatory Visit (INDEPENDENT_AMBULATORY_CARE_PROVIDER_SITE_OTHER): Payer: Medicare Other | Admitting: Neurology

## 2013-10-02 VITALS — BP 128/68 | HR 68 | Temp 98.0°F | Resp 16 | Wt 197.2 lb

## 2013-10-02 DIAGNOSIS — R413 Other amnesia: Secondary | ICD-10-CM

## 2013-10-02 DIAGNOSIS — F411 Generalized anxiety disorder: Secondary | ICD-10-CM | POA: Diagnosis not present

## 2013-10-02 DIAGNOSIS — I2510 Atherosclerotic heart disease of native coronary artery without angina pectoris: Secondary | ICD-10-CM

## 2013-10-02 NOTE — Patient Instructions (Signed)
Your test went well today.  I don't see any evidence of dementia.  I would repeat testing in 6 months.

## 2013-10-02 NOTE — Progress Notes (Signed)
NEUROLOGY FOLLOW UP OFFICE NOTE  Melissa Ferrell [id number]  HISTORY OF PRESENT ILLNESS: Melissa Ferrell is a 78 year old right-handed woman with history of CAD, CHF, hypertension, COPD, Rheumatoid arthritis, chronic venous insufficiency, depression and anxiety who follows up for memory problems.  UPDATE: No changes since last visit.  Loses train of thought or what she meant to say in middle of conversation.  Otherwise, functioning well in regards to ADLs and managing finances.  She does have significant anxiety.  HISTORY: She began noticing issues about 3 years ago.  Sometimes, she would be in the middle of a conversation and stop mid-sentence.  She describes it as "half of my brain being erased."  She says she knows what she wants to say but can't get the words out.  Other times, it is related to losing her train of thought.  It lasts only a few seconds.  Also, she has been going to pulmonary rehab since last June.  She began having trouble remembering the names of the therapists that she sees there, even though she has regularly been going there for over 6 months.  She has played bridge for many years.  Sometimes when she plays bridge, she will forget what is the bid, what cards are played, and how to go forward with her hand.  She drives and does not get disoriented on familiar routes, but she no longer is able to drive to unfamiliar areas.  She lives in a continuing care center.  She is able to manage her finances and perform her ADLs.  No hallucinations or delusions.  She does have anxiety.  She thinks that her mother may have had dementia.  MoCA from March 2015 was 30/30.  04/03/13 B12 1151. 01/29/13 TSH 1.04  PAST MEDICAL HISTORY: Past Medical History  Diagnosis Date  . HTN (hypertension)   . Hyperlipidemia   . IBS (irritable bowel syndrome)   . PUD (peptic ulcer disease)     secondary to NSAID-1975  . Depression   . Scarlet fever   . Emphysema   . Osteoporosis   . GERD  (gastroesophageal reflux disease)   . Hx of adenomatous colonic polyps   . Anxiety   . Hemorrhoids   . Migraine headache   . Rheumatoid arthritis(714.0)   . Diastolic dysfunction   . History of colonoscopy   . DDD (degenerative disc disease)   . Fracture, spinal   . Carotid artery occlusion     MEDICATIONS: Current Outpatient Prescriptions on File Prior to Visit  Medication Sig Dispense Refill  . ALPRAZolam (XANAX) 0.25 MG tablet Take .5-1 tab prn  30 tablet  3  . aspirin 81 MG tablet Take 81 mg by mouth 2 (two) times daily.       . Calcium Carbonate-Vitamin D 600-400 MG-UNIT per tablet Take 1 tablet by mouth 2 (two) times daily.       . carvedilol (COREG) 12.5 MG tablet Take 1 tablet (12.5 mg total) by mouth 2 (two) times daily with a meal.  180 tablet  3  . Flaxseed, Linseed, (FLAXSEED OIL) 1000 MG CAPS Take 1 capsule by mouth daily at 12 noon.       . folic acid (FOLVITE) 1 MG tablet Take 1 mg by mouth daily at 12 noon.       . hydrochlorothiazide (HYDRODIURIL) 25 MG tablet TAKE 1 TABLET BY MOUTH EVERY MORNING  90 tablet  1  . Indacaterol Maleate (ARCAPTA NEOHALER) 75 MCG CAPS PLACE ONE CAPSULE  INTO THE INHALER AND INHALE DAILY  30 capsule  4  . Lecithin 1200 MG CAPS Take 1,200 mg by mouth every morning.       . Magnesium 250 MG TABS Take 1 tablet by mouth every morning.       . Multiple Vitamins-Minerals (CENTRUM SILVER PO) Take 1 tablet by mouth every morning.       . Multiple Vitamins-Minerals (OCUVITE EYE HEALTH FORMULA PO) Take 1 tablet by mouth daily at 12 noon.       . Omega-3 Fatty Acids (FISH OIL) 1200 MG CAPS Take 1 capsule by mouth daily at 12 noon.       . Omega-3 Krill Oil 300 MG CAPS Take by mouth.      Marland Kitchen PROAIR HFA 108 (90 BASE) MCG/ACT inhaler INHALE 2 PUFFS BY MOUTH EVERY 6 HOURS AS NEEDED FOR WHEEZING  8.5 g  6  . telmisartan (MICARDIS) 80 MG tablet Take 40 mg by mouth 2 (two) times daily.      Marland Kitchen tiotropium (SPIRIVA HANDIHALER) 18 MCG inhalation capsule PLACE 1  CAPSULE INTO THE INHALER EVERY DAY AS DIRECTED  30 capsule  6  . vitamin B-12 (CYANOCOBALAMIN) 500 MCG tablet Take 500 mcg by mouth every morning.        No current facility-administered medications on file prior to visit.    ALLERGIES: Allergies  Allergen Reactions  . Anoro Ellipta [Umeclidinium-Vilanterol]     HEADACHES  . Azithromycin   . Bee Venom   . Benzocaine   . Cefdinir   . Ciprofloxacin   . Diphenhydramine Hcl   . Indomethacin   . Lasix [Furosemide]     Low tolerance    . Other     Adhesive tape  . Paba Derivatives   . Penicillins   . Prednisone     Low tolerance   . Statins     Low tolerance    . Zetia [Ezetimibe]     Low tolerance     FAMILY HISTORY: Family History  Problem Relation Age of Onset  . Heart disease Mother     MI  . Hypertension Mother   . Heart attack Mother   . Stomach cancer Brother   . Cancer Brother   . Heart disease Brother   . Hypertension Brother   . Heart attack Brother   . AAA (abdominal aortic aneurysm) Brother   . Aneurysm Brother     brain  . Melanoma Brother   . Colon cancer Brother   . Heart disease Brother   . Alcohol abuse    . Heart attack Brother 45  . Allergies Sister   . Hypertension Sister   . Breast cancer Maternal Grandmother   . Brain cancer Father   . Cancer Father     SOCIAL HISTORY: History   Social History  . Marital Status: Divorced    Spouse Name: N/A    Number of Children: 3  . Years of Education: N/A   Occupational History  . Retired-homemaker    Social History Main Topics  . Smoking status: Former Smoker -- 3.00 packs/day for 30 years    Types: Cigarettes    Quit date: 01/17/1985  . Smokeless tobacco: Never Used     Comment: previous 80 pack year history  . Alcohol Use: Yes     Comment: occ  . Drug Use: No  . Sexual Activity: No   Other Topics Concern  . Not on file   Social History Narrative  .  No narrative on file    REVIEW OF SYSTEMS: Constitutional: No fevers,  chills, or sweats, no generalized fatigue, change in appetite Eyes: No visual changes, double vision, eye pain Ear, nose and throat: No hearing loss, ear pain, nasal congestion, sore throat Cardiovascular: No chest pain, palpitations Respiratory:  No shortness of breath at rest or with exertion, wheezes GastrointestinaI: No nausea, vomiting, diarrhea, abdominal pain, fecal incontinence Genitourinary:  No dysuria, urinary retention or frequency Musculoskeletal:  No neck pain, back pain Integumentary: No rash, pruritus, skin lesions Neurological: as above Psychiatric: No depression, insomnia, anxiety Endocrine: No palpitations, fatigue, diaphoresis, mood swings, change in appetite, change in weight, increased thirst Hematologic/Lymphatic:  No anemia, purpura, petechiae. Allergic/Immunologic: no itchy/runny eyes, nasal congestion, recent allergic reactions, rashes  PHYSICAL EXAM: Filed Vitals:   10/02/13 0906  BP: 128/68  Pulse: 68  Temp: 98 F (36.7 C)  Resp: 16   General: No acute distress Head:  Normocephalic/atraumatic Neck: supple, no paraspinal tenderness, full range of motion Heart:  Regular rate and rhythm Lungs:  Clear to auscultation bilaterally Back: No paraspinal tenderness Neurological Exam: alert and oriented to person, place, and time (date was off by a day). Attention span and concentration intact, recent (missed one word on delayed recall) and remote memory intact, fund of knowledge intact.   Montreal Cognitive Assessment  10/02/2013  Visuospatial/ Executive (0/5) 5  Naming (0/3) 3  Attention: Read list of digits (0/2) 2  Attention: Read list of letters (0/1) 0  Attention: Serial 7 subtraction starting at 100 (0/3) 3  Language: Repeat phrase (0/2) 1  Language : Fluency (0/1) 1  Abstraction (0/2) 2  Delayed Recall (0/5) 4  Orientation (0/6) 6  Total 27  Adjusted Score (based on education) 27   Speech fluent and not dysarthric, language intact.  CN II-XII  intact. Fundoscopic exam unremarkable without vessel changes, exudates, hemorrhages or papilledema.  Bulk and tone normal, muscle strength 5/5 throughout.  Sensation to light touch, temperature and vibration reduced in feet.  Deep tendon reflexes 1+ in upper extremities and absent in lower extremities, toes downgoing.  Finger to nose intact.  Gait normal, Romberg negative.  IMPRESSION: Memory deficits.  I don't appreciate any cognitive impairment on repeat testing.  Suspect symptoms are related to anxiety.  PLAN: We will have her follow up in 6 months for another retest.  30 minutes spent with patient, over 50% spent discussing test results.  Metta Clines, DO  CC:  Roxy Cedar, FNP

## 2013-10-21 ENCOUNTER — Encounter: Payer: Self-pay | Admitting: Pulmonary Disease

## 2013-10-21 MED ORDER — INDACATEROL MALEATE 75 MCG IN CAPS: ORAL_CAPSULE | RESPIRATORY_TRACT | Status: DC

## 2013-10-25 ENCOUNTER — Encounter: Payer: Self-pay | Admitting: Family

## 2013-10-25 ENCOUNTER — Ambulatory Visit (INDEPENDENT_AMBULATORY_CARE_PROVIDER_SITE_OTHER): Payer: Medicare Other | Admitting: Family

## 2013-10-25 ENCOUNTER — Other Ambulatory Visit (HOSPITAL_COMMUNITY)
Admission: RE | Admit: 2013-10-25 | Discharge: 2013-10-25 | Disposition: A | Discharge status: Home / Self Care | Payer: Medicare Other | Source: Ambulatory Visit | Attending: Family | Admitting: Family

## 2013-10-25 VITALS — BP 128/74 | HR 70 | Ht 64.0 in | Wt 196.8 lb

## 2013-10-25 DIAGNOSIS — B373 Candidiasis of vulva and vagina: Secondary | ICD-10-CM

## 2013-10-25 DIAGNOSIS — R350 Frequency of micturition: Secondary | ICD-10-CM | POA: Diagnosis not present

## 2013-10-25 DIAGNOSIS — B3731 Acute candidiasis of vulva and vagina: Secondary | ICD-10-CM

## 2013-10-25 DIAGNOSIS — N760 Acute vaginitis: Secondary | ICD-10-CM | POA: Diagnosis not present

## 2013-10-25 DIAGNOSIS — I2510 Atherosclerotic heart disease of native coronary artery without angina pectoris: Secondary | ICD-10-CM | POA: Diagnosis not present

## 2013-10-25 LAB — POCT URINALYSIS DIPSTICK
Bilirubin, UA: NEGATIVE
Glucose, UA: NEGATIVE
Ketones, UA: NEGATIVE
Nitrite, UA: NEGATIVE
Protein, UA: NEGATIVE
Spec Grav, UA: <=1.005
Urobilinogen, UA: 0.2
pH, UA: 6.5

## 2013-10-25 MED ORDER — FLUCONAZOLE 150 MG PO TABS: 150.0000 mg | ORAL_TABLET | Freq: Once | ORAL | Status: DC

## 2013-10-25 NOTE — Patient Instructions (Signed)

## 2013-10-25 NOTE — Progress Notes (Signed)
Pre visit review using our clinic review tool, if applicable. No additional management support is needed unless otherwise documented below in the visit note. 

## 2013-10-25 NOTE — Progress Notes (Signed)
Subjective:    Patient ID: Melissa Ferrell, female    DOB: 1935/10/20, 78 y.o.   MRN: [id number]  HPI  78 year old white female, nonsmoker, is in today with c/o vaginal odor, urinary frequency x 4 days. Denies any pain. Reports being sore in her genital area. Reports using a panty liner on Tuesday that she feels may have irritated her. No abdominal pain or back pain.   Review of Systems  Constitutional: Negative.   Respiratory: Negative.   Cardiovascular: Negative.   Gastrointestinal: Negative.   Endocrine: Negative.   Genitourinary: Positive for frequency, vaginal discharge and vaginal pain.  Musculoskeletal: Negative.   Skin: Negative.   Allergic/Immunologic: Negative.   Neurological: Negative.   Psychiatric/Behavioral: Negative.    Past Medical History  Diagnosis Date  . HTN (hypertension)   . Hyperlipidemia   . IBS (irritable bowel syndrome)   . PUD (peptic ulcer disease)     secondary to NSAID-1975  . Depression   . Scarlet fever   . Emphysema   . Osteoporosis   . GERD (gastroesophageal reflux disease)   . Hx of adenomatous colonic polyps   . Anxiety   . Hemorrhoids   . Migraine headache   . Rheumatoid arthritis(714.0)   . Diastolic dysfunction   . History of colonoscopy   . DDD (degenerative disc disease)   . Fracture, spinal   . Carotid artery occlusion     History   Social History  . Marital Status: Divorced    Spouse Name: N/A    Number of Children: 3  . Years of Education: N/A   Occupational History  . Retired-homemaker    Social History Main Topics  . Smoking status: Former Smoker -- 3.00 packs/day for 30 years    Types: Cigarettes    Quit date: 01/17/1985  . Smokeless tobacco: Never Used     Comment: previous 80 pack year history  . Alcohol Use: Yes     Comment: occ  . Drug Use: No  . Sexual Activity: No   Other Topics Concern  . Not on file   Social History Narrative  . No narrative on file    Past Surgical History  Procedure  Laterality Date  . Thyroglossal duct cyst      1943, 1944, 1973  . Chronically infected lymph node removed  1973    Family History  Problem Relation Age of Onset  . Heart disease Mother     MI  . Hypertension Mother   . Heart attack Mother   . Stomach cancer Brother   . Cancer Brother   . Heart disease Brother   . Hypertension Brother   . Heart attack Brother   . AAA (abdominal aortic aneurysm) Brother   . Aneurysm Brother     brain  . Melanoma Brother   . Colon cancer Brother   . Heart disease Brother   . Alcohol abuse    . Heart attack Brother 28  . Allergies Sister   . Hypertension Sister   . Breast cancer Maternal Grandmother   . Brain cancer Father   . Cancer Father     Allergies  Allergen Reactions  . Anoro Ellipta [Umeclidinium-Vilanterol]     HEADACHES  . Azithromycin   . Bee Venom   . Benzocaine   . Cefdinir   . Ciprofloxacin   . Diphenhydramine Hcl   . Indomethacin   . Lasix [Furosemide]     Low tolerance    . Other  Adhesive tape  . Paba Derivatives   . Penicillins   . Prednisone     Low tolerance   . Statins     Low tolerance    . Zetia [Ezetimibe]     Low tolerance     Current Outpatient Prescriptions on File Prior to Visit  Medication Sig Dispense Refill  . ALPRAZolam (XANAX) 0.25 MG tablet Take .5-1 tab prn  30 tablet  3  . aspirin 81 MG tablet Take 81 mg by mouth 2 (two) times daily.       . Calcium Carbonate-Vitamin D 600-400 MG-UNIT per tablet Take 1 tablet by mouth 2 (two) times daily.       . carvedilol (COREG) 12.5 MG tablet Take 1 tablet (12.5 mg total) by mouth 2 (two) times daily with a meal.  180 tablet  3  . Flaxseed, Linseed, (FLAXSEED OIL) 1000 MG CAPS Take 1 capsule by mouth daily at 12 noon.       . folic acid (FOLVITE) 1 MG tablet Take 1 mg by mouth daily at 12 noon.       . hydrochlorothiazide (HYDRODIURIL) 25 MG tablet TAKE 1 TABLET BY MOUTH EVERY MORNING  90 tablet  1  . Indacaterol Maleate (ARCAPTA NEOHALER) 75  MCG CAPS PLACE ONE CAPSULE INTO THE INHALER AND INHALE DAILY  30 capsule  4  . Lecithin 1200 MG CAPS Take 1,200 mg by mouth every morning.       . Magnesium 250 MG TABS Take 1 tablet by mouth every morning.       . Multiple Vitamins-Minerals (CENTRUM SILVER PO) Take 1 tablet by mouth every morning.       . Multiple Vitamins-Minerals (OCUVITE EYE HEALTH FORMULA PO) Take 1 tablet by mouth daily at 12 noon.       . Omega-3 Fatty Acids (FISH OIL) 1200 MG CAPS Take 1 capsule by mouth daily at 12 noon.       . Omega-3 Krill Oil 300 MG CAPS Take by mouth.      Marland Kitchen PROAIR HFA 108 (90 BASE) MCG/ACT inhaler INHALE 2 PUFFS BY MOUTH EVERY 6 HOURS AS NEEDED FOR WHEEZING  8.5 g  6  . telmisartan (MICARDIS) 80 MG tablet Take 40 mg by mouth 2 (two) times daily.      Marland Kitchen tiotropium (SPIRIVA HANDIHALER) 18 MCG inhalation capsule PLACE 1 CAPSULE INTO THE INHALER EVERY DAY AS DIRECTED  30 capsule  6  . vitamin B-12 (CYANOCOBALAMIN) 500 MCG tablet Take 500 mcg by mouth every morning.        No current facility-administered medications on file prior to visit.    BP 128/74  Pulse 70  Ht 5\' 4"  (1.626 m)  Wt 196 lb 12.8 oz (89.268 kg)  BMI 33.76 kg/m2chart    Objective:   Physical Exam  Constitutional: She is oriented to person, place, and time. She appears well-developed and well-nourished.  HENT:  Right Ear: External ear normal.  Left Ear: External ear normal.  Nose: Nose normal.  Mouth/Throat: Oropharynx is clear and moist.  Neck: Normal range of motion. Neck supple.  Cardiovascular: Normal rate, regular rhythm and normal heart sounds.   Pulmonary/Chest: Effort normal and breath sounds normal.  Abdominal: Soft. Bowel sounds are normal.  Musculoskeletal: Normal range of motion.  Neurological: She is alert and oriented to person, place, and time.  Skin: Skin is warm and dry.  Psychiatric: She has a normal mood and affect.  Assessment & Plan:  Melissa Ferrell was seen today for no specified  reason.  Diagnoses and associated orders for this visit:  Urinary frequency - POCT Urine Dip - Culture, Urine  Vaginal candida - Cervicovaginal ancillary only  Other Orders - fluconazole (DIFLUCAN) 150 MG tablet; Take 1 tablet (150 mg total) by mouth once.   Call the office with any questions or concerns. Recheck as scheduled and as needed.

## 2013-10-28 LAB — CERVICOVAGINAL ANCILLARY ONLY
Wet Prep (BD Affirm): NEGATIVE
Wet Prep (BD Affirm): NEGATIVE
Wet Prep (BD Affirm): NEGATIVE

## 2013-11-14 ENCOUNTER — Encounter: Payer: Self-pay | Admitting: Family

## 2013-11-14 ENCOUNTER — Ambulatory Visit (INDEPENDENT_AMBULATORY_CARE_PROVIDER_SITE_OTHER): Payer: Medicare Other | Admitting: Family

## 2013-11-14 VITALS — BP 118/80 | HR 63 | Temp 98.5°F | Ht 64.0 in | Wt 201.5 lb

## 2013-11-14 DIAGNOSIS — I2510 Atherosclerotic heart disease of native coronary artery without angina pectoris: Secondary | ICD-10-CM

## 2013-11-14 DIAGNOSIS — J0101 Acute recurrent maxillary sinusitis: Secondary | ICD-10-CM

## 2013-11-14 DIAGNOSIS — R51 Headache: Secondary | ICD-10-CM

## 2013-11-14 DIAGNOSIS — R519 Headache, unspecified: Secondary | ICD-10-CM

## 2013-11-14 MED ORDER — DOXYCYCLINE HYCLATE 100 MG PO TABS: 100.0000 mg | ORAL_TABLET | Freq: Two times a day (BID) | ORAL | Status: DC

## 2013-11-14 MED ORDER — FLUTICASONE PROPIONATE 50 MCG/ACT NA SUSP: 2.0000 | Freq: Every day | NASAL | Status: DC

## 2013-11-14 NOTE — Progress Notes (Signed)
Subjective:    Patient ID: Melissa Ferrell, female    DOB: [birth year], 78 y.o.   MRN: [id number]  Sinusitis Associated symptoms include chills, congestion, coughing, headaches, sinus pressure, sneezing and a sore throat. Pertinent negatives include no shortness of breath.  URI  Associated symptoms include congestion, coughing, headaches, sneezing and a sore throat. Pertinent negatives include no wheezing.  Headache  Associated symptoms include coughing, a fever, sinus pressure and a sore throat.    78 year old white female, nonsmoker is in today with complaints of cough, nasal congestion, facial pain, fever and chills that began 10 days ago. The symptoms appear to be getting better and returned and are worse now. Reports exposure potentially at work. Has not been taking any medication over-the-counter at this point. Has numerous medication allergies. Has a history of COPD, coronary artery disease, depression.  Review of Systems  Constitutional: Positive for fever, chills and fatigue.  HENT: Positive for congestion, postnasal drip, sinus pressure, sneezing and sore throat.   Respiratory: Positive for cough. Negative for shortness of breath and wheezing.   Cardiovascular: Negative.   Gastrointestinal: Negative.   Musculoskeletal: Negative.   Skin: Negative.   Allergic/Immunologic: Negative.   Neurological: Positive for headaches.  Hematological: Negative.   Psychiatric/Behavioral: Negative.    Past Medical History  Diagnosis Date  . HTN (hypertension)   . Hyperlipidemia   . IBS (irritable bowel syndrome)   . PUD (peptic ulcer disease)     secondary to NSAID-1975  . Depression   . Scarlet fever   . Emphysema   . Osteoporosis   . GERD (gastroesophageal reflux disease)   . Hx of adenomatous colonic polyps   . Anxiety   . Hemorrhoids   . Migraine headache   . Rheumatoid arthritis(714.0)   . Diastolic dysfunction   . History of colonoscopy   . DDD (degenerative disc disease)    . Fracture, spinal   . Carotid artery occlusion     History   Social History  . Marital Status: Divorced    Spouse Name: N/A    Number of Children: 3  . Years of Education: N/A   Occupational History  . Retired-homemaker    Social History Main Topics  . Smoking status: Former Smoker -- 3.00 packs/day for 30 years    Types: Cigarettes    Quit date: 01/17/1985  . Smokeless tobacco: Never Used     Comment: previous 80 pack year history  . Alcohol Use: Yes     Comment: occ  . Drug Use: No  . Sexual Activity: No   Other Topics Concern  . Not on file   Social History Narrative  . No narrative on file    Past Surgical History  Procedure Laterality Date  . Thyroglossal duct cyst      1943, 1944, 1973  . Chronically infected lymph node removed  1973    Family History  Problem Relation Age of Onset  . Heart disease Mother     MI  . Hypertension Mother   . Heart attack Mother   . Stomach cancer Brother   . Cancer Brother   . Heart disease Brother   . Hypertension Brother   . Heart attack Brother   . AAA (abdominal aortic aneurysm) Brother   . Aneurysm Brother     brain  . Melanoma Brother   . Colon cancer Brother   . Heart disease Brother   . Alcohol abuse    . Heart attack Brother 74  .  Allergies Sister   . Hypertension Sister   . Breast cancer Maternal Grandmother   . Brain cancer Father   . Cancer Father     Allergies  Allergen Reactions  . Anoro Ellipta [Umeclidinium-Vilanterol]     HEADACHES  . Azithromycin   . Bee Venom   . Benzocaine   . Cefdinir   . Ciprofloxacin   . Diphenhydramine Hcl   . Indomethacin   . Lasix [Furosemide]     Low tolerance    . Other     Adhesive tape  . Paba Derivatives   . Penicillins   . Prednisone     Low tolerance   . Statins     Low tolerance    . Zetia [Ezetimibe]     Low tolerance     Current Outpatient Prescriptions on File Prior to Visit  Medication Sig Dispense Refill  . ALPRAZolam (XANAX)  0.25 MG tablet Take .5-1 tab prn  30 tablet  3  . aspirin 81 MG tablet Take 81 mg by mouth 2 (two) times daily.       . Calcium Carbonate-Vitamin D 600-400 MG-UNIT per tablet Take 1 tablet by mouth 2 (two) times daily.       . carvedilol (COREG) 12.5 MG tablet Take 1 tablet (12.5 mg total) by mouth 2 (two) times daily with a meal.  180 tablet  3  . Flaxseed, Linseed, (FLAXSEED OIL) 1000 MG CAPS Take 1 capsule by mouth daily at 12 noon.       . fluconazole (DIFLUCAN) 150 MG tablet Take 1 tablet (150 mg total) by mouth once.  1 tablet  0  . folic acid (FOLVITE) 1 MG tablet Take 1 mg by mouth daily at 12 noon.       . hydrochlorothiazide (HYDRODIURIL) 25 MG tablet TAKE 1 TABLET BY MOUTH EVERY MORNING  90 tablet  1  . Indacaterol Maleate (ARCAPTA NEOHALER) 75 MCG CAPS PLACE ONE CAPSULE INTO THE INHALER AND INHALE DAILY  30 capsule  4  . Lecithin 1200 MG CAPS Take 1,200 mg by mouth every morning.       . Magnesium 250 MG TABS Take 1 tablet by mouth every morning.       . Multiple Vitamins-Minerals (CENTRUM SILVER PO) Take 1 tablet by mouth every morning.       . Multiple Vitamins-Minerals (OCUVITE EYE HEALTH FORMULA PO) Take 1 tablet by mouth daily at 12 noon.       . Omega-3 Fatty Acids (FISH OIL) 1200 MG CAPS Take 1 capsule by mouth daily at 12 noon.       . Omega-3 Krill Oil 300 MG CAPS Take by mouth.      Marland Kitchen PROAIR HFA 108 (90 BASE) MCG/ACT inhaler INHALE 2 PUFFS BY MOUTH EVERY 6 HOURS AS NEEDED FOR WHEEZING  8.5 g  6  . telmisartan (MICARDIS) 80 MG tablet Take 40 mg by mouth 2 (two) times daily.      Marland Kitchen tiotropium (SPIRIVA HANDIHALER) 18 MCG inhalation capsule PLACE 1 CAPSULE INTO THE INHALER EVERY DAY AS DIRECTED  30 capsule  6  . vitamin B-12 (CYANOCOBALAMIN) 500 MCG tablet Take 500 mcg by mouth every morning.        No current facility-administered medications on file prior to visit.    BP 118/80  Pulse 63  Temp(Src) 98.5 F (36.9 C) (Oral)  Ht 5\' 4"  (1.626 m)  Wt 201 lb 8 oz (91.4 kg)   BMI 34.57 kg/m2chart  Objective:   Physical Exam  Constitutional: She is oriented to person, place, and time. She appears well-developed and well-nourished.  HENT:  Right Ear: External ear normal.  Left Ear: External ear normal.  Maxillary sinus tenderness to palpation.  Neck: Normal range of motion. Neck supple.  Cardiovascular: Normal rate, regular rhythm and normal heart sounds.   Pulmonary/Chest: Effort normal and breath sounds normal.  Musculoskeletal: Normal range of motion.  Neurological: She is alert and oriented to person, place, and time.  Skin: Skin is warm and dry.  Psychiatric: She has a normal mood and affect.          Assessment & Plan:  Dexter was seen today for sinusitis, uri and headache.  Diagnoses and associated orders for this visit:  Acute recurrent maxillary sinusitis  Facial pain  Other Orders - doxycycline (VIBRA-TABS) 100 MG tablet; Take 1 tablet (100 mg total) by mouth 2 (two) times daily. - fluticasone (FLONASE) 50 MCG/ACT nasal spray; Place 2 sprays into both nostrils daily.    Call the office with any questions or concerns. Recheck as scheduled, and sooner as needed.

## 2013-11-14 NOTE — Patient Instructions (Signed)

## 2013-11-14 NOTE — Progress Notes (Signed)
Pre visit review using our clinic review tool, if applicable. No additional management support is needed unless otherwise documented below in the visit note. 

## 2013-11-28 ENCOUNTER — Encounter: Payer: Self-pay | Admitting: Family

## 2013-11-28 ENCOUNTER — Encounter: Payer: Self-pay | Admitting: Pulmonary Disease

## 2013-11-28 ENCOUNTER — Telehealth: Payer: Self-pay | Admitting: *Deleted

## 2013-11-28 NOTE — Telephone Encounter (Signed)
Per pt email sent 11/28/13: Message     Medicare Rx advised me that Arcapta will no longer be covered in 2016. Using it will cost full price which I cannot do and I don't qualify for reduced pricing from the manufacturer. They suggested using Spiriva (which I already use) or Serevent Diskus (which my pharmacist says is not appropriate with many cardiology issues I have). I recently refilled Arcapta. Would it be appropriate to try to wean my system off Arcapta while using Spiriva daily and ProAir when needed?    Xandra Laramee ([birth year]37/[birth year]) [phone]    Please advise [place] thanks

## 2013-11-29 NOTE — Telephone Encounter (Signed)
I have sent pt an email. Will close this encounter

## 2013-11-29 NOTE — Telephone Encounter (Signed)
spiriva and arcapta are totally different meds.  Can't substitute one for the other.  I agree she should not use serevent.  See if foradil or striverdi  is covered on her plan.  If not, will have to go with spiriva alone with as needed albuterol

## 2013-12-02 ENCOUNTER — Encounter: Payer: Self-pay | Admitting: Pulmonary Disease

## 2013-12-02 ENCOUNTER — Encounter: Payer: Self-pay | Admitting: Cardiology

## 2013-12-02 ENCOUNTER — Telehealth: Payer: Self-pay | Admitting: *Deleted

## 2013-12-02 NOTE — Telephone Encounter (Signed)
So I guess she will be using spiriva with as needed albuterol in 2016.  No she should not wean arcapta.

## 2013-12-02 NOTE — Telephone Encounter (Signed)
Pt email sent to pt. Nothing further needed

## 2013-12-02 NOTE — Telephone Encounter (Signed)
Message     I was able to reach [facility] this weekend and learned that neither Striverdi nor foradil is covered by my [employer] which Dr. Gwenette Greet asked in the reply to my questions regarding Arcapta no longer being covered in 2016.     I presume that I am to continue using Arcapta and Spiriva until the end of 2015 along with ProAir when needed. Should I try reducing Arcapta to every other day or just stop "cold [place]"?    Melissa Ferrell (05/04/2035) [phone]    Please advise [place] thanks

## 2013-12-11 DIAGNOSIS — H2513 Age-related nuclear cataract, bilateral: Secondary | ICD-10-CM | POA: Diagnosis not present

## 2013-12-11 DIAGNOSIS — H52203 Unspecified astigmatism, bilateral: Secondary | ICD-10-CM | POA: Diagnosis not present

## 2013-12-16 ENCOUNTER — Encounter: Payer: Self-pay | Admitting: Cardiology

## 2013-12-20 DIAGNOSIS — L300 Nummular dermatitis: Secondary | ICD-10-CM | POA: Diagnosis not present

## 2013-12-20 DIAGNOSIS — L821 Other seborrheic keratosis: Secondary | ICD-10-CM | POA: Diagnosis not present

## 2013-12-20 DIAGNOSIS — L720 Epidermal cyst: Secondary | ICD-10-CM | POA: Diagnosis not present

## 2013-12-20 DIAGNOSIS — L570 Actinic keratosis: Secondary | ICD-10-CM | POA: Diagnosis not present

## 2013-12-26 ENCOUNTER — Encounter: Payer: Self-pay | Admitting: *Deleted

## 2014-01-15 DIAGNOSIS — L565 Disseminated superficial actinic porokeratosis (DSAP): Secondary | ICD-10-CM | POA: Diagnosis not present

## 2014-01-23 NOTE — Progress Notes (Signed)
HPI: FU congestive heart failure and hypertension. Myoview in May of 2011. Perfusion was normal. The study was not gated. Echocardiogram in August 2013 showed normal LV function. We felt that her dyspnea was most likely secondary to COPD. Carotid Dopplers in Sept 2015 showed no hemodynamically significant stenosis. Since I last saw her, she has some dyspnea on exertion. No orthopnea or PND. She does have chest pain that can last for 2 minutes at a time resolved with bronchodilators. The pain is not pleuritic or positional. She has seen ophthalmology and there is concern that her eye exam is consistent with uncontrolled hypertension. She has kept records for [place] and her systolic blood pressure runs approximately 110 to 130. She notes some dizziness after taking her a.m. Medications.  Current Outpatient Prescriptions  Medication Sig Dispense Refill  . ALPRAZolam (XANAX) 0.25 MG tablet Take .5-1 tab prn 30 tablet 3  . aspirin 81 MG tablet Take 81 mg by mouth 2 (two) times daily.     . Calcium Carbonate-Vitamin D 600-400 MG-UNIT per tablet Take 1 tablet by mouth 2 (two) times daily.     . carvedilol (COREG) 12.5 MG tablet Take 1 tablet (12.5 mg total) by mouth 2 (two) times daily with a meal. 180 tablet 3  . doxycycline (VIBRA-TABS) 100 MG tablet Take 1 tablet (100 mg total) by mouth 2 (two) times daily. (Patient taking differently: Take 100 mg by mouth as needed. ) 20 tablet 0  . Flaxseed, Linseed, (FLAXSEED OIL) 1000 MG CAPS Take 1 capsule by mouth daily at 12 noon.     . fluconazole (DIFLUCAN) 150 MG tablet Take 1 tablet (150 mg total) by mouth once. 1 tablet 0  . fluticasone (FLONASE) 50 MCG/ACT nasal spray Place 2 sprays into both nostrils daily. 16 g 6  . folic acid (FOLVITE) 1 MG tablet Take 1 mg by mouth daily at 12 noon.     . hydrochlorothiazide (HYDRODIURIL) 25 MG tablet TAKE 1 TABLET BY MOUTH EVERY MORNING 90 tablet 1  . Lecithin 1200 MG CAPS Take 1,200 mg by mouth every morning.       . Magnesium 250 MG TABS Take 1 tablet by mouth every morning.     . Multiple Vitamins-Minerals (CENTRUM SILVER PO) Take 1 tablet by mouth every morning.     . Multiple Vitamins-Minerals (OCUVITE EYE HEALTH FORMULA PO) Take 1 tablet by mouth daily at 12 noon.     . Omega-3 Fatty Acids (FISH OIL) 1200 MG CAPS Take 1 capsule by mouth daily at 12 noon.     . Omega-3 Krill Oil 300 MG CAPS Take by mouth.    Marland Kitchen PROAIR HFA 108 (90 BASE) MCG/ACT inhaler INHALE 2 PUFFS BY MOUTH EVERY 6 HOURS AS NEEDED FOR WHEEZING 8.5 g 6  . telmisartan (MICARDIS) 80 MG tablet Take 40 mg by mouth 2 (two) times daily.    Marland Kitchen tiotropium (SPIRIVA HANDIHALER) 18 MCG inhalation capsule PLACE 1 CAPSULE INTO THE INHALER EVERY DAY AS DIRECTED 30 capsule 6  . vitamin B-12 (CYANOCOBALAMIN) 500 MCG tablet Take 500 mcg by mouth every morning.      No current facility-administered medications for this visit.     Past Medical History  Diagnosis Date  . HTN (hypertension)   . Hyperlipidemia   . IBS (irritable bowel syndrome)   . PUD (peptic ulcer disease)     secondary to NSAID-1975  . Depression   . Scarlet fever   . Emphysema   .  Osteoporosis   . GERD (gastroesophageal reflux disease)   . Hx of adenomatous colonic polyps   . Anxiety   . Hemorrhoids   . Migraine headache   . Rheumatoid arthritis(714.0)   . Diastolic dysfunction   . History of colonoscopy   . DDD (degenerative disc disease)   . Fracture, spinal   . Carotid artery occlusion     Past Surgical History  Procedure Laterality Date  . Thyroglossal duct cyst      1943, 1944, 1973  . Chronically infected lymph node removed  1973    History   Social History  . Marital Status: Divorced    Spouse Name: N/A    Number of Children: 3  . Years of Education: N/A   Occupational History  . Retired-homemaker    Social History Main Topics  . Smoking status: Former Smoker -- 3.00 packs/day for 30 years    Types: Cigarettes    Quit date: 01/17/1985  .  Smokeless tobacco: Never Used     Comment: previous 80 pack year history  . Alcohol Use: Yes     Comment: occ  . Drug Use: No  . Sexual Activity: No   Other Topics Concern  . Not on file   Social History Narrative    ROS: no fevers or chills, productive cough, hemoptysis, dysphasia, odynophagia, melena, hematochezia, dysuria, hematuria, rash, seizure activity, orthopnea, PND, pedal edema, claudication. Remaining systems are negative.  Physical Exam: Well-developed well-nourished in no acute distress.  Skin is warm and dry.  HEENT is normal.  Neck is supple.  Chest diminished BS bases Cardiovascular exam is regular rate and rhythm.  Abdominal exam nontender or distended. No masses palpated. Extremities show no edema. neuro grossly intact  ECG normal sinus rhythm with no ST changes.

## 2014-01-24 ENCOUNTER — Encounter: Payer: Self-pay | Admitting: Cardiology

## 2014-01-24 ENCOUNTER — Ambulatory Visit (INDEPENDENT_AMBULATORY_CARE_PROVIDER_SITE_OTHER): Payer: Medicare Other | Admitting: Cardiology

## 2014-01-24 ENCOUNTER — Other Ambulatory Visit: Payer: Self-pay | Admitting: Family

## 2014-01-24 VITALS — BP 152/84 | HR 63 | Ht 64.0 in | Wt 199.2 lb

## 2014-01-24 DIAGNOSIS — I2510 Atherosclerotic heart disease of native coronary artery without angina pectoris: Secondary | ICD-10-CM | POA: Diagnosis not present

## 2014-01-24 DIAGNOSIS — I10 Essential (primary) hypertension: Secondary | ICD-10-CM | POA: Diagnosis not present

## 2014-01-24 DIAGNOSIS — I679 Cerebrovascular disease, unspecified: Secondary | ICD-10-CM | POA: Diagnosis not present

## 2014-01-24 MED ORDER — LOSARTAN POTASSIUM 100 MG PO TABS: 100.0000 mg | ORAL_TABLET | Freq: Every day | ORAL | Status: DC

## 2014-01-24 NOTE — Assessment & Plan Note (Addendum)
There is concern that her eye exam is consistent with uncontrolled hypertension. However she has been tracking her blood pressure twice daily at home. Her systolic in general runs between 110 to 130. I have asked her to continue her present regimen. She will also continue to follow her blood pressure at home and we will adjust as needed. Insurance is not covering her micardis. Once she completes her present prescription changed to Cozaar 100 mg daily.

## 2014-01-24 NOTE — Assessment & Plan Note (Signed)
Minor plaque noted on most recent Dopplers.

## 2014-01-24 NOTE — Patient Instructions (Signed)
Your physician wants you to follow-up in: [address] [place] will receive a reminder letter in the mail two months in advance. If you don't receive a letter, please call our office to schedule the follow-up appointment.   STOP MICARDIS WHEN CURRENT SUPPLY IS GONE  START LOSARTAN 100 MG ONCE DAILY IN PLACE OF MICARDIS

## 2014-01-24 NOTE — Assessment & Plan Note (Signed)
Management per primary care. 

## 2014-01-24 NOTE — Telephone Encounter (Signed)
Ok to fill 

## 2014-01-26 ENCOUNTER — Ambulatory Visit (INDEPENDENT_AMBULATORY_CARE_PROVIDER_SITE_OTHER): Payer: Medicare Other | Admitting: Emergency Medicine

## 2014-01-26 VITALS — BP 132/90 | HR 64 | Temp 98.0°F | Resp 18 | Ht 64.0 in | Wt 201.6 lb

## 2014-01-26 DIAGNOSIS — B3731 Acute candidiasis of vulva and vagina: Secondary | ICD-10-CM

## 2014-01-26 DIAGNOSIS — R300 Dysuria: Secondary | ICD-10-CM

## 2014-01-26 DIAGNOSIS — I2510 Atherosclerotic heart disease of native coronary artery without angina pectoris: Secondary | ICD-10-CM

## 2014-01-26 DIAGNOSIS — B373 Candidiasis of vulva and vagina: Secondary | ICD-10-CM | POA: Diagnosis not present

## 2014-01-26 LAB — POCT UA - MICROSCOPIC ONLY
Bacteria, U Microscopic: NEGATIVE
Casts, Ur, LPF, POC: NEGATIVE
Crystals, Ur, HPF, POC: NEGATIVE
MUCUS UA: NEGATIVE
RBC, urine, microscopic: NEGATIVE
YEAST UA: NEGATIVE

## 2014-01-26 LAB — POCT URINALYSIS DIPSTICK
Bilirubin, UA: NEGATIVE
Blood, UA: NEGATIVE
Glucose, UA: NEGATIVE
Ketones, UA: NEGATIVE
Nitrite, UA: NEGATIVE
Protein, UA: NEGATIVE
Spec Grav, UA: 1.01
Urobilinogen, UA: 0.2
pH, UA: 6.5

## 2014-01-26 MED ORDER — FLUCONAZOLE 150 MG PO TABS: 150.0000 mg | ORAL_TABLET | Freq: Once | ORAL | Status: DC

## 2014-01-26 NOTE — Progress Notes (Signed)
[facility] [address], [place] [address] [phone]  Date:  01/26/2014   Name:  Melissa Ferrell   DOB:  05-07-1935   MRN:  [id number]  PCP:  Donia Ast, FNP    Chief Complaint: Dysuria; Urinary Frequency; and Vaginal Pain   History of Present Illness:  Melissa Ferrell is a 79 y.o. very pleasant female patient who presents with the following:  Has vaginal itching and burning.  No fever or chills Not sexually active No nocturia, urgency or frequency No nausea of vomiting History of candiasis.   No discharge No improvement with over the counter medications or other home remedies.  Denies other complaint or health concern today.   Patient Active Problem List   Diagnosis Date Noted  . Varicose veins of lower extremities with other complications 38/18/2993  . Cerebrovascular disease 08/20/2010  . DYSPNEA 05/21/2009  . DEPRESSION 04/10/2007  . PEPTIC ULCER DISEASE 04/10/2007  . IBS 04/09/2007  . FATIGUE 04/09/2007  . HYPERLIPIDEMIA 10/15/2006  . ANXIETY 10/15/2006  . MIGRAINE HEADACHE 10/15/2006  . Essential hypertension 10/15/2006  . CORONARY ARTERY DISEASE 10/15/2006  . COPD (chronic obstructive pulmonary disease) with emphysema 10/15/2006  . GERD 10/15/2006  . OSTEOPOROSIS 10/15/2006  . RHEUMATIC FEVER, HX OF 10/15/2006  . COLONIC POLYPS, HX OF 10/15/2006  . HEMORRHOIDS, HX OF 10/15/2006    Past Medical History  Diagnosis Date  . HTN (hypertension)   . Hyperlipidemia   . IBS (irritable bowel syndrome)   . PUD (peptic ulcer disease)     secondary to NSAID-1975  . Depression   . Scarlet fever   . Emphysema   . Osteoporosis   . GERD (gastroesophageal reflux disease)   . Hx of adenomatous colonic polyps   . Anxiety   . Hemorrhoids   . Migraine headache   . Rheumatoid arthritis(714.0)   . Diastolic dysfunction   . History of colonoscopy   . DDD (degenerative disc disease)   . Fracture, spinal   . Carotid artery  occlusion     Past Surgical History  Procedure Laterality Date  . Thyroglossal duct cyst      1943, 1944, 1973  . Chronically infected lymph node removed  1973    History  Substance Use Topics  . Smoking status: Former Smoker -- 3.00 packs/day for 30 years    Types: Cigarettes    Quit date: 01/17/1985  . Smokeless tobacco: Never Used     Comment: previous 80 pack year history  . Alcohol Use: Yes     Comment: occ    Family History  Problem Relation Age of Onset  . Heart disease Mother     MI  . Hypertension Mother   . Heart attack Mother   . Stomach cancer Brother   . Cancer Brother   . Heart disease Brother   . Hypertension Brother   . Heart attack Brother   . AAA (abdominal aortic aneurysm) Brother   . Aneurysm Brother     brain  . Melanoma Brother   . Colon cancer Brother   . Heart disease Brother   . Alcohol abuse    . Heart attack Brother 60  . Allergies Sister   . Hypertension Sister   . Breast cancer Maternal Grandmother   . Brain cancer Father   . Cancer Father     Allergies  Allergen Reactions  . Anoro Ellipta [Umeclidinium-Vilanterol]     HEADACHES  . Azithromycin   . Bee  Venom   . Benzocaine   . Bystolic [Nebivolol Hcl]   . Cefdinir   . Ciprofloxacin   . Diphenhydramine Hcl   . Erythromycin   . Indomethacin   . Lasix [Furosemide]     Low tolerance    . Other     Adhesive tape  . Paba Derivatives   . Penicillins   . Prednisone     Low tolerance   . Statins     Low tolerance    . Tobramycin   . Zetia [Ezetimibe]     Low tolerance     Medication list has been reviewed and updated.  Current Outpatient Prescriptions on File Prior to Visit  Medication Sig Dispense Refill  . ALPRAZolam (XANAX) 0.25 MG tablet Take .5-1 tab prn 30 tablet 3  . aspirin 81 MG tablet Take 81 mg by mouth 2 (two) times daily.     . Calcium Carbonate-Vitamin D 600-400 MG-UNIT per tablet Take 1 tablet by mouth 2 (two) times daily.     . carvedilol (COREG)  12.5 MG tablet Take 1 tablet (12.5 mg total) by mouth 2 (two) times daily with a meal. 180 tablet 3  . Flaxseed, Linseed, (FLAXSEED OIL) 1000 MG CAPS Take 1 capsule by mouth daily at 12 noon.     . folic acid (FOLVITE) 1 MG tablet Take 1 mg by mouth daily at 12 noon.     . hydrochlorothiazide (HYDRODIURIL) 25 MG tablet TAKE 1 TABLET BY MOUTH EVERY MORNING 90 tablet 1  . Lecithin 1200 MG CAPS Take 1,200 mg by mouth every morning.     Marland Kitchen losartan (COZAAR) 100 MG tablet Take 1 tablet (100 mg total) by mouth daily. 90 tablet 3  . Magnesium 250 MG TABS Take 1 tablet by mouth every morning.     . Multiple Vitamins-Minerals (CENTRUM SILVER PO) Take 1 tablet by mouth every morning.     . Multiple Vitamins-Minerals (OCUVITE EYE HEALTH FORMULA PO) Take 1 tablet by mouth daily at 12 noon.     . Omega-3 Fatty Acids (FISH OIL) 1200 MG CAPS Take 1 capsule by mouth daily at 12 noon.     . Omega-3 Krill Oil 300 MG CAPS Take by mouth.    Marland Kitchen PROAIR HFA 108 (90 BASE) MCG/ACT inhaler INHALE 2 PUFFS BY MOUTH EVERY 6 HOURS AS NEEDED FOR WHEEZING 8.5 g 6  . tiotropium (SPIRIVA HANDIHALER) 18 MCG inhalation capsule PLACE 1 CAPSULE INTO THE INHALER EVERY DAY AS DIRECTED 30 capsule 6  . vitamin B-12 (CYANOCOBALAMIN) 500 MCG tablet Take 500 mcg by mouth every morning.     Marland Kitchen doxycycline (VIBRA-TABS) 100 MG tablet Take 1 tablet (100 mg total) by mouth 2 (two) times daily. (Patient not taking: Reported on 01/26/2014) 20 tablet 0  . fluticasone (FLONASE) 50 MCG/ACT nasal spray Place 2 sprays into both nostrils daily. (Patient not taking: Reported on 01/26/2014) 16 g 6   No current facility-administered medications on file prior to visit.    Review of Systems:  As per HPI, otherwise negative.    Physical Examination: Filed Vitals:   01/26/14 0914  BP: 132/90  Pulse: 64  Temp: 98 F (36.7 C)  Resp: 18   Filed Vitals:   01/26/14 0914  Height: 5\' 4"  (1.626 m)  Weight: 201 lb 9.6 oz (91.445 kg)   Body mass index is  34.59 kg/(m^2). Ideal Body Weight: Weight in (lb) to have BMI = 25: 145.3   GEN: WDWN, NAD, Non-toxic, Alert &  Oriented x 3 HEENT: Atraumatic, Normocephalic.  Ears and Nose: No external deformity. EXTR: No clubbing/cyanosis/edema NEURO: Normal gait.  PSYCH: Normally interactive. Conversant. Not depressed or anxious appearing.  Calm demeanor.    Assessment and Plan: candiasis Diflucan  Signed,  Ellison Carwin, MD   Results for orders placed or performed in visit on 01/26/14  POCT urinalysis dipstick  Result Value Ref Range   Color, UA yellow    Clarity, UA clear    Glucose, UA neg    Bilirubin, UA neg    Ketones, UA neg    Spec Grav, UA 1.010    Blood, UA neg    pH, UA 6.5    Protein, UA neg    Urobilinogen, UA 0.2    Nitrite, UA neg    Leukocytes, UA Trace   POCT UA - Microscopic Only  Result Value Ref Range   WBC, Ur, HPF, POC 0-2    RBC, urine, microscopic neg    Bacteria, U Microscopic neg    Mucus, UA neg    Epithelial cells, urine per micros 0-2    Crystals, Ur, HPF, POC neg    Casts, Ur, LPF, POC neg    Yeast, UA neg

## 2014-01-26 NOTE — Patient Instructions (Signed)

## 2014-01-28 ENCOUNTER — Telehealth: Payer: Self-pay | Admitting: Family

## 2014-01-28 NOTE — Telephone Encounter (Signed)
Pt request refill of the following: ALPRAZolam (XANAX) 0.25 MG tablet   Pt has contacted pharmacy and they do not have the rx to refill this med. Pt is asking that the rx be sent to her pharmacy       Phamacy: [facility]

## 2014-01-28 NOTE — Telephone Encounter (Signed)
Ok to fill 

## 2014-01-28 NOTE — Telephone Encounter (Signed)
Rx was faxed.

## 2014-01-28 NOTE — Telephone Encounter (Signed)
Pharm called bc they have not heard anything concerning pt's rx ALPRAZolam (XANAX) 0.25 MG tablet.  [facility]/ [address]

## 2014-02-04 ENCOUNTER — Ambulatory Visit (INDEPENDENT_AMBULATORY_CARE_PROVIDER_SITE_OTHER): Payer: Medicare Other | Admitting: Family Medicine

## 2014-02-04 ENCOUNTER — Encounter: Payer: Medicare Other | Admitting: Family

## 2014-02-04 ENCOUNTER — Ambulatory Visit (INDEPENDENT_AMBULATORY_CARE_PROVIDER_SITE_OTHER): Payer: Medicare Other

## 2014-02-04 VITALS — BP 174/90 | HR 61 | Temp 97.6°F | Resp 16 | Ht 62.0 in | Wt 203.2 lb

## 2014-02-04 DIAGNOSIS — I2510 Atherosclerotic heart disease of native coronary artery without angina pectoris: Secondary | ICD-10-CM

## 2014-02-04 DIAGNOSIS — R071 Chest pain on breathing: Secondary | ICD-10-CM

## 2014-02-04 NOTE — Progress Notes (Addendum)
This chart was scribed for Melissa Haber, MD by Einar Pheasant, ED Scribe. This patient was seen in room 14 and the patient's care was started at 10:30 AM.  Patient ID: Melissa Ferrell MRN: [id number], DOB: 13-Mar-1935, 79 y.o. Date of Encounter: 02/04/2014, 10:28 AM  Primary Physician: Donia Ast, FNP  Chief Complaint:  Chief Complaint  Patient presents with  . Chest Pain    Rib pain;  reached over an chair and felt something on right side of chest     HPI: 79 y.o. year old female with history below. Pt presents to the office complaining of right sided rib pain. Pt states that she reached over a chair and felt something in her right side pop. She has a h/o COPD and osteoporosis. Pt denies fever, neck pain, sore throat, visual disturbance, CP, cough, SOB, abdominal pain, nausea, emesis, diarrhea, urinary symptoms, back pain, HA, weakness, numbness and rash as associated symptoms.      Past Medical History  Diagnosis Date  . HTN (hypertension)   . Hyperlipidemia   . IBS (irritable bowel syndrome)   . PUD (peptic ulcer disease)     secondary to NSAID-1975  . Depression   . Scarlet fever   . Emphysema   . Osteoporosis   . GERD (gastroesophageal reflux disease)   . Hx of adenomatous colonic polyps   . Anxiety   . Hemorrhoids   . Migraine headache   . Rheumatoid arthritis(714.0)   . Diastolic dysfunction   . History of colonoscopy   . DDD (degenerative disc disease)   . Fracture, spinal   . Carotid artery occlusion      Home Meds: Prior to Admission medications   Medication Sig Start Date End Date Taking? Authorizing Provider  ALPRAZolam Duanne Moron) 0.25 MG tablet TAKE ONE HALF TO 1 TABLET BY MOUTH AS NEEDED 01/28/14  Yes Timoteo Gaul, FNP  aspirin 81 MG tablet Take 81 mg by mouth 2 (two) times daily.    Yes Historical Provider, MD  Calcium Carbonate-Vitamin D 600-400 MG-UNIT per tablet Take 1 tablet by mouth 2 (two) times daily.    Yes Historical Provider,  MD  carvedilol (COREG) 12.5 MG tablet Take 1 tablet (12.5 mg total) by mouth 2 (two) times daily with a meal. 06/17/13  Yes Lelon Perla, MD  Flaxseed, Linseed, (FLAXSEED OIL) 1000 MG CAPS Take 1 capsule by mouth daily at 12 noon.    Yes Historical Provider, MD  fluconazole (DIFLUCAN) 150 MG tablet Take 1 tablet (150 mg total) by mouth once. Repeat if needed 01/26/14  Yes Roselee Culver, MD  fluticasone [facility]) 50 MCG/ACT nasal spray Place 2 sprays into both nostrils daily. 11/14/13  Yes Timoteo Gaul, FNP  folic acid (FOLVITE) 1 MG tablet Take 1 mg by mouth daily at 12 noon.    Yes Historical Provider, MD  hydrochlorothiazide (HYDRODIURIL) 25 MG tablet TAKE 1 TABLET BY MOUTH EVERY MORNING 08/07/13  Yes Timoteo Gaul, FNP  Lecithin 1200 MG CAPS Take 1,200 mg by mouth every morning.    Yes Historical Provider, MD  losartan (COZAAR) 100 MG tablet Take 1 tablet (100 mg total) by mouth daily. 01/24/14  Yes Lelon Perla, MD  Magnesium 250 MG TABS Take 1 tablet by mouth every morning.    Yes Historical Provider, MD  Multiple Vitamins-Minerals (CENTRUM SILVER PO) Take 1 tablet by mouth every morning.    Yes Historical Provider, MD  Multiple Vitamins-Minerals ([place])  Take 1 tablet by mouth daily at 12 noon.    Yes Historical Provider, MD  Omega-3 Fatty Acids (FISH OIL) 1200 MG CAPS Take 1 capsule by mouth daily at 12 noon.    Yes Historical Provider, MD  Omega-3 Krill Oil 300 MG CAPS Take by mouth.   Yes Historical Provider, MD  PROAIR HFA 108 (90 BASE) MCG/ACT inhaler INHALE 2 PUFFS BY MOUTH EVERY 6 HOURS AS NEEDED FOR WHEEZING 07/12/13  Yes Kathee Delton, MD  tiotropium (SPIRIVA HANDIHALER) 18 MCG inhalation capsule PLACE 1 CAPSULE INTO THE INHALER EVERY DAY AS DIRECTED 09/02/13  Yes Kathee Delton, MD  vitamin B-12 (CYANOCOBALAMIN) 500 MCG tablet Take 500 mcg by mouth every morning.    Yes Historical Provider, MD    Allergies:  Allergies  Allergen Reactions    . Anoro Ellipta [Umeclidinium-Vilanterol]     HEADACHES  . Azithromycin   . Bee Venom   . Benzocaine   . Bystolic [Nebivolol Hcl]   . Cefdinir   . Ciprofloxacin   . Diphenhydramine Hcl   . Erythromycin   . Indomethacin   . Lasix [Furosemide]     Low tolerance    . Other     Adhesive tape  . Paba Derivatives   . Penicillins   . Prednisone     Low tolerance   . Statins     Low tolerance    . Tobramycin   . Zetia [Ezetimibe]     Low tolerance     History   Social History  . Marital Status: Divorced    Spouse Name: N/A    Number of Children: 3  . Years of Education: N/A   Occupational History  . Retired-homemaker    Social History Main Topics  . Smoking status: Former Smoker -- 3.00 packs/day for 30 years    Types: Cigarettes    Quit date: 01/17/1985  . Smokeless tobacco: Never Used     Comment: previous 80 pack year history  . Alcohol Use: Yes     Comment: occ  . Drug Use: No  . Sexual Activity: No   Other Topics Concern  . Not on file   Social History Narrative     Review of Systems:Positive right sided rib pain Constitutional: negative for chills, fever, night sweats, weight changes, or fatigue  HEENT: negative for vision changes, hearing loss, congestion, rhinorrhea, ST, epistaxis, or sinus pressure Cardiovascular: negative for chest pain or palpitations Respiratory: negative for hemoptysis, wheezing, shortness of breath, or cough Abdominal: negative for abdominal pain, nausea, vomiting, diarrhea, or constipation Dermatological: negative for rash Neurologic: negative for headache, dizziness, or syncope All other systems reviewed and are otherwise negative with the exception to those above and in the HPI.   Physical Exam: Blood pressure 174/90, pulse 61, temperature 97.6 F (36.4 C), temperature source Oral, resp. rate 16, height 5\' 2"  (1.575 m), weight 203 lb 3.2 oz (92.171 kg), SpO2 94 %., Body mass index is 37.16 kg/(m^2). General: Well  developed, well nourished, in no acute distress. Head: Normocephalic, atraumatic, eyes without discharge, sclera non-icteric, nares are without discharge. Bilateral auditory canals clear, TM's are without perforation, pearly grey and translucent with reflective cone of light bilaterally. Oral cavity moist, posterior pharynx without exudate, erythema, peritonsillar abscess, or post nasal drip.  Neck: Supple. No thyromegaly. Full ROM. No lymphadenopathy. Lungs: Bibasilar rales with more on the right. She is tender over the right anterior lower ribs. There is no ecchymosis there. Heart: RRR with  S1 S2. No murmurs, rubs, or gallops appreciated. Abdomen: Soft, non-tender, non-distended with normoactive bowel sounds. No hepatomegaly. No rebound/guarding. No obvious abdominal masses. Msk:  Strength and tone normal for age. Extremities/Skin: Warm and dry. No clubbing or cyanosis. No edema. No rashes or suspicious lesions. Right rib tenderness. Neuro: Alert and oriented X 3. Moves all extremities spontaneously. Gait is normal. CNII-XII grossly in tact. Psych:  Responds to questions appropriately with a normal affect.   [facility] reading (PRIMARY) by  Dr. Joseph Art: right ribs.  negative     ASSESSMENT AND PLAN:  79 y.o. year old female with chest wall pain secondary to rib contusion No treatment requested nor prescribed     Signed, Melissa Haber, MD 02/04/2014 10:28 AM

## 2014-02-06 ENCOUNTER — Encounter: Payer: Self-pay | Admitting: Vascular Surgery

## 2014-02-10 ENCOUNTER — Encounter: Payer: Self-pay | Admitting: Family

## 2014-02-11 ENCOUNTER — Encounter: Payer: Self-pay | Admitting: Family

## 2014-02-11 ENCOUNTER — Ambulatory Visit (INDEPENDENT_AMBULATORY_CARE_PROVIDER_SITE_OTHER): Payer: Medicare Other | Admitting: Family

## 2014-02-11 VITALS — BP 162/80 | HR 71 | Ht 62.0 in | Wt 200.0 lb

## 2014-02-11 DIAGNOSIS — J439 Emphysema, unspecified: Secondary | ICD-10-CM | POA: Diagnosis not present

## 2014-02-11 DIAGNOSIS — E7800 Pure hypercholesterolemia, unspecified: Secondary | ICD-10-CM

## 2014-02-11 DIAGNOSIS — R600 Localized edema: Secondary | ICD-10-CM

## 2014-02-11 DIAGNOSIS — I10 Essential (primary) hypertension: Secondary | ICD-10-CM | POA: Diagnosis not present

## 2014-02-11 DIAGNOSIS — R609 Edema, unspecified: Secondary | ICD-10-CM | POA: Diagnosis not present

## 2014-02-11 DIAGNOSIS — M810 Age-related osteoporosis without current pathological fracture: Secondary | ICD-10-CM

## 2014-02-11 DIAGNOSIS — I2510 Atherosclerotic heart disease of native coronary artery without angina pectoris: Secondary | ICD-10-CM | POA: Diagnosis not present

## 2014-02-11 DIAGNOSIS — R5382 Chronic fatigue, unspecified: Secondary | ICD-10-CM

## 2014-02-11 MED ORDER — ALPRAZOLAM 0.25 MG PO TABS: ORAL_TABLET | ORAL | Status: DC

## 2014-02-11 NOTE — Progress Notes (Signed)
Pre visit review using our clinic review tool, if applicable. No additional management support is needed unless otherwise documented below in the visit note. 

## 2014-02-11 NOTE — Progress Notes (Signed)
Subjective:    Patient ID: Melissa Ferrell, female    DOB: 1936-01-04, 79 y.o.   MRN: [id number]  HPI 79 year old white female, nonsmoker with a history of hypertension, hyperlipidemia, cerebrovascular disease, peripheral edema, anxiety, depression, and osteoporosis is in today for a recheck. Requesting a handicap placard. Also requesting a have an up-to-date bone density scan. She declines mammography due to age and unwillingness to have any surgical procedures. Had a carotid Doppler study done through cardiology that was normal. Reports seeing the eye doctor who referred her to cardiology due to damage of the vessels of her eyes. She's currently on blood pressure medication. Has been seeing dermatology and reports precancerous lesions to her lower extremities for which she is considering having them frozen. However, once the consult with vascular to see if this is the best decision for her. Has not seen pulmonology in over a year. Has concerns and will like to have new pulmonary function tests done. Has a known history of emphysema. Requesting a refill on Xanax that she takes a few times per week. Left DO NOT RESUSCITATE paperwork that she had completed through the attorney's office.   Review of Systems  Constitutional: Negative.   HENT: Negative.   Respiratory: Negative.   Cardiovascular: Negative.   Gastrointestinal: Negative.   Endocrine: Negative.   Genitourinary: Negative.   Musculoskeletal: Negative.   Skin: Negative.   Allergic/Immunologic: Negative.   Neurological: Negative.   Hematological: Negative.   Psychiatric/Behavioral: Negative.    Past Medical History  Diagnosis Date  . HTN (hypertension)   . Hyperlipidemia   . IBS (irritable bowel syndrome)   . PUD (peptic ulcer disease)     secondary to NSAID-1975  . Depression   . Scarlet fever   . Emphysema   . Osteoporosis   . GERD (gastroesophageal reflux disease)   . Hx of adenomatous colonic polyps   . Anxiety   .  Hemorrhoids   . Migraine headache   . Rheumatoid arthritis(714.0)   . Diastolic dysfunction   . History of colonoscopy   . DDD (degenerative disc disease)   . Fracture, spinal   . Carotid artery occlusion     History   Social History  . Marital Status: Divorced    Spouse Name: N/A    Number of Children: 3  . Years of Education: N/A   Occupational History  . Retired-homemaker    Social History Main Topics  . Smoking status: Former Smoker -- 3.00 packs/day for 30 years    Types: Cigarettes    Quit date: 01/17/1985  . Smokeless tobacco: Never Used     Comment: previous 80 pack year history  . Alcohol Use: Yes     Comment: occ  . Drug Use: No  . Sexual Activity: No   Other Topics Concern  . Not on file   Social History Narrative    Past Surgical History  Procedure Laterality Date  . Thyroglossal duct cyst      1943, 1944, 1973  . Chronically infected lymph node removed  1973    Family History  Problem Relation Age of Onset  . Heart disease Mother     MI  . Hypertension Mother   . Heart attack Mother   . Stomach cancer Brother   . Cancer Brother   . Heart disease Brother   . Hypertension Brother   . Heart attack Brother   . AAA (abdominal aortic aneurysm) Brother   . Aneurysm Brother  brain  . Melanoma Brother   . Colon cancer Brother   . Heart disease Brother   . Alcohol abuse    . Heart attack Brother 69  . Allergies Sister   . Hypertension Sister   . Breast cancer Maternal Grandmother   . Brain cancer Father   . Cancer Father     Allergies  Allergen Reactions  . Anoro Ellipta [Umeclidinium-Vilanterol]     HEADACHES  . Azithromycin   . Bee Venom   . Benzocaine   . Bystolic [Nebivolol Hcl]   . Cefdinir   . Ciprofloxacin   . Diphenhydramine Hcl   . Erythromycin   . Indomethacin   . Lasix [Furosemide]     Low tolerance    . Other     Adhesive tape  . Paba Derivatives   . Penicillins   . Prednisone     Low tolerance   . Statins      Low tolerance    . Tobramycin   . Zetia [Ezetimibe]     Low tolerance     Current Outpatient Prescriptions on File Prior to Visit  Medication Sig Dispense Refill  . aspirin 81 MG tablet Take 81 mg by mouth 2 (two) times daily.     . Calcium Carbonate-Vitamin D 600-400 MG-UNIT per tablet Take 1 tablet by mouth 2 (two) times daily.     . carvedilol (COREG) 12.5 MG tablet Take 1 tablet (12.5 mg total) by mouth 2 (two) times daily with a meal. 180 tablet 3  . Flaxseed, Linseed, (FLAXSEED OIL) 1000 MG CAPS Take 1 capsule by mouth daily at 12 noon.     . fluconazole (DIFLUCAN) 150 MG tablet Take 1 tablet (150 mg total) by mouth once. Repeat if needed 2 tablet 0  . folic acid (FOLVITE) 1 MG tablet Take 1 mg by mouth daily at 12 noon.     . hydrochlorothiazide (HYDRODIURIL) 25 MG tablet TAKE 1 TABLET BY MOUTH EVERY MORNING 90 tablet 1  . Lecithin 1200 MG CAPS Take 1,200 mg by mouth every morning.     Marland Kitchen losartan (COZAAR) 100 MG tablet Take 1 tablet (100 mg total) by mouth daily. 90 tablet 3  . Magnesium 250 MG TABS Take 1 tablet by mouth every morning.     . Multiple Vitamins-Minerals (CENTRUM SILVER PO) Take 1 tablet by mouth every morning.     . Multiple Vitamins-Minerals (OCUVITE EYE HEALTH FORMULA PO) Take 1 tablet by mouth daily at 12 noon.     Marland Kitchen PROAIR HFA 108 (90 BASE) MCG/ACT inhaler INHALE 2 PUFFS BY MOUTH EVERY 6 HOURS AS NEEDED FOR WHEEZING 8.5 g 6  . tiotropium (SPIRIVA HANDIHALER) 18 MCG inhalation capsule PLACE 1 CAPSULE INTO THE INHALER EVERY DAY AS DIRECTED 30 capsule 6  . vitamin B-12 (CYANOCOBALAMIN) 500 MCG tablet Take 500 mcg by mouth every morning.      No current facility-administered medications on file prior to visit.    BP 162/80 mmHg  Pulse 71  Ht 5\' 2"  (1.575 m)  Wt 200 lb (90.719 kg)  BMI 36.57 kg/m2chart    Objective:   Physical Exam  Constitutional: She is oriented to person, place, and time. She appears well-developed and well-nourished.  HENT:  Head:  Normocephalic.  Right Ear: External ear normal.  Left Ear: External ear normal.  Nose: Nose normal.  Mouth/Throat: Oropharynx is clear and moist.  Eyes: Conjunctivae and EOM are normal. Pupils are equal, round, and reactive to light.  Neck:  Normal range of motion. Neck supple.  Cardiovascular: Normal rate, regular rhythm and normal heart sounds.   Pulmonary/Chest: Effort normal and breath sounds normal.  Abdominal: Soft. Bowel sounds are normal.  Musculoskeletal: Normal range of motion.  Neurological: She is alert and oriented to person, place, and time. She has normal reflexes.  Skin: Skin is warm and dry.  Psychiatric: She has a normal mood and affect.          Assessment & Plan:  Melissa Ferrell was seen today for discuss concerns.  Diagnoses and associated orders for this visit:  Pulmonary emphysema, unspecified emphysema type  Essential hypertension - Basic Metabolic Panel; Future - Hepatic Function Panel; Future  Peripheral edema  Osteoporosis - DG Bone Density; Future  Chronic fatigue - TSH; Future  Pure hypercholesterolemia - Lipid Panel; Future  Other Orders - ALPRAZolam (XANAX) 0.25 MG tablet; TAKE ONE HALF TO 1 TABLET BY MOUTH AS NEEDED    Call the office with any questions or concerns. Follow-up with specialist as scheduled. Follow-up here in 4 months. Continue current medications.

## 2014-02-11 NOTE — Patient Instructions (Signed)
Fat and Cholesterol Control Diet Fat and cholesterol levels in your blood and organs are influenced by your diet. High levels of fat and cholesterol may lead to diseases of the heart, small and large blood vessels, gallbladder, liver, and pancreas. CONTROLLING FAT AND CHOLESTEROL WITH DIET Although exercise and lifestyle factors are important, your diet is key. That is because certain foods are known to raise cholesterol and others to lower it. The goal is to balance foods for their effect on cholesterol and more importantly, to replace saturated and trans fat with other types of fat, such as monounsaturated fat, polyunsaturated fat, and omega-3 fatty acids. On average, a person should consume no more than 15 to 17 g of saturated fat daily. Saturated and trans fats are considered "bad" fats, and they will raise LDL cholesterol. Saturated fats are primarily found in animal products such as meats, butter, and cream. However, that does not mean you need to give up all your favorite foods. Today, there are good tasting, low-fat, low-cholesterol substitutes for most of the things you like to eat. Choose low-fat or nonfat alternatives. Choose round or loin cuts of red meat. These types of cuts are lowest in fat and cholesterol. Chicken (without the skin), fish, veal, and ground turkey breast are great choices. Eliminate fatty meats, such as hot dogs and salami. Even shellfish have little or no saturated fat. Have a 3 oz (85 g) portion when you eat lean meat, poultry, or fish. Trans fats are also called "partially hydrogenated oils." They are oils that have been scientifically manipulated so that they are solid at room temperature resulting in a longer shelf life and improved taste and texture of foods in which they are added. Trans fats are found in stick margarine, some tub margarines, cookies, crackers, and baked goods.  When baking and cooking, oils are a great substitute for butter. The monounsaturated oils are  especially beneficial since it is believed they lower LDL and raise HDL. The oils you should avoid entirely are saturated tropical oils, such as coconut and palm.  Remember to eat a lot from food groups that are naturally free of saturated and trans fat, including fish, fruit, vegetables, beans, grains (barley, rice, couscous, bulgur wheat), and pasta (without cream sauces).  IDENTIFYING FOODS THAT LOWER FAT AND CHOLESTEROL  Soluble fiber may lower your cholesterol. This type of fiber is found in fruits such as apples, vegetables such as broccoli, potatoes, and carrots, legumes such as beans, peas, and lentils, and grains such as barley. Foods fortified with plant sterols (phytosterol) may also lower cholesterol. You should eat at least 2 g per day of these foods for a cholesterol lowering effect.  Read package labels to identify low-saturated fats, trans fat free, and low-fat foods at the supermarket. Select cheeses that have only 2 to 3 g saturated fat per ounce. Use a heart-healthy tub margarine that is free of trans fats or partially hydrogenated oil. When buying baked goods (cookies, crackers), avoid partially hydrogenated oils. Breads and muffins should be made from whole grains (whole-wheat or whole oat flour, instead of "flour" or "enriched flour"). Buy non-creamy canned soups with reduced salt and no added fats.  FOOD PREPARATION TECHNIQUES  Never deep-fry. If you must fry, either stir-fry, which uses very little fat, or use non-stick cooking sprays. When possible, broil, bake, or roast meats, and steam vegetables. Instead of putting butter or margarine on vegetables, use lemon and herbs, applesauce, and cinnamon (for squash and sweet potatoes). Use nonfat   yogurt, salsa, and low-fat dressings for salads.  LOW-SATURATED FAT / LOW-FAT FOOD SUBSTITUTES Meats / Saturated Fat (g)  Avoid: Steak, marbled (3 oz/85 g) / 11 g  Choose: Steak, lean (3 oz/85 g) / 4 g  Avoid: Hamburger (3 oz/85 g) / 7  g  Choose: Hamburger, lean (3 oz/85 g) / 5 g  Avoid: Ham (3 oz/85 g) / 6 g  Choose: Ham, lean cut (3 oz/85 g) / 2.4 g  Avoid: Chicken, with skin, dark meat (3 oz/85 g) / 4 g  Choose: Chicken, skin removed, dark meat (3 oz/85 g) / 2 g  Avoid: Chicken, with skin, light meat (3 oz/85 g) / 2.5 g  Choose: Chicken, skin removed, light meat (3 oz/85 g) / 1 g Dairy / Saturated Fat (g)  Avoid: Whole milk (1 cup) / 5 g  Choose: Low-fat milk, 2% (1 cup) / 3 g  Choose: Low-fat milk, 1% (1 cup) / 1.5 g  Choose: Skim milk (1 cup) / 0.3 g  Avoid: Hard cheese (1 oz/28 g) / 6 g  Choose: Skim milk cheese (1 oz/28 g) / 2 to 3 g  Avoid: Cottage cheese, 4% fat (1 cup) / 6.5 g  Choose: Low-fat cottage cheese, 1% fat (1 cup) / 1.5 g  Avoid: Ice cream (1 cup) / 9 g  Choose: Sherbet (1 cup) / 2.5 g  Choose: Nonfat frozen yogurt (1 cup) / 0.3 g  Choose: Frozen fruit bar / trace  Avoid: Whipped cream (1 tbs) / 3.5 g  Choose: Nondairy whipped topping (1 tbs) / 1 g Condiments / Saturated Fat (g)  Avoid: Mayonnaise (1 tbs) / 2 g  Choose: Low-fat mayonnaise (1 tbs) / 1 g  Avoid: Butter (1 tbs) / 7 g  Choose: Extra light margarine (1 tbs) / 1 g  Avoid: Coconut oil (1 tbs) / 11.8 g  Choose: Olive oil (1 tbs) / 1.8 g  Choose: Corn oil (1 tbs) / 1.7 g  Choose: Safflower oil (1 tbs) / 1.2 g  Choose: Sunflower oil (1 tbs) / 1.4 g  Choose: Soybean oil (1 tbs) / 2.4 g  Choose: Canola oil (1 tbs) / 1 g Document Released: 01/03/2005 Document Revised: 04/30/2012 Document Reviewed: 04/03/2013 [employer] Patient Information 2015 [employer]. This information is not intended to replace advice given to you by your health care provider. Make sure you discuss any questions you have with your health care provider.  

## 2014-02-12 ENCOUNTER — Other Ambulatory Visit (INDEPENDENT_AMBULATORY_CARE_PROVIDER_SITE_OTHER): Payer: Medicare Other

## 2014-02-12 DIAGNOSIS — I10 Essential (primary) hypertension: Secondary | ICD-10-CM

## 2014-02-12 DIAGNOSIS — R5382 Chronic fatigue, unspecified: Secondary | ICD-10-CM | POA: Diagnosis not present

## 2014-02-12 DIAGNOSIS — E7800 Pure hypercholesterolemia, unspecified: Secondary | ICD-10-CM

## 2014-02-12 LAB — BASIC METABOLIC PANEL
BUN: 19 mg/dL (ref 6–23)
CALCIUM: 9.9 mg/dL (ref 8.4–10.5)
CO2: 30 meq/L (ref 19–32)
Chloride: 103 mEq/L (ref 96–112)
Creatinine, Ser: 0.96 mg/dL (ref 0.40–1.20)
GFR: 59.96 mL/min — ABNORMAL LOW (ref >60.00)
Glucose, Bld: 100 mg/dL — ABNORMAL HIGH (ref 70–99)
Potassium: 3.7 mEq/L (ref 3.5–5.1)
Sodium: 140 mEq/L (ref 135–145)

## 2014-02-12 LAB — LIPID PANEL
CHOL/HDL RATIO: 4
Cholesterol: 209 mg/dL — ABNORMAL HIGH (ref 0–200)
HDL: 51.3 mg/dL (ref >39.00)
LDL Cholesterol: 132 mg/dL — ABNORMAL HIGH (ref 0–99)
NonHDL: 157.7
Triglycerides: 129 mg/dL (ref 0.0–149.0)
VLDL: 25.8 mg/dL (ref 0.0–40.0)

## 2014-02-12 LAB — HEPATIC FUNCTION PANEL
ALBUMIN: 4 g/dL (ref 3.5–5.2)
ALT: 18 U/L (ref 0–35)
AST: 20 U/L (ref 0–37)
Alkaline Phosphatase: 42 U/L (ref 39–117)
Bilirubin, Direct: 0.1 mg/dL (ref 0.0–0.3)
Total Bilirubin: 0.7 mg/dL (ref 0.2–1.2)
Total Protein: 6.7 g/dL (ref 6.0–8.3)

## 2014-02-12 LAB — TSH: TSH: 2.54 u[IU]/mL (ref 0.35–4.50)

## 2014-02-18 ENCOUNTER — Ambulatory Visit (INDEPENDENT_AMBULATORY_CARE_PROVIDER_SITE_OTHER)
Admission: RE | Admit: 2014-02-18 | Discharge: 2014-02-18 | Disposition: A | Discharge status: Home / Self Care | Payer: Medicare Other | Source: Ambulatory Visit | Attending: Family | Admitting: Family

## 2014-02-18 DIAGNOSIS — M810 Age-related osteoporosis without current pathological fracture: Secondary | ICD-10-CM

## 2014-02-25 ENCOUNTER — Encounter: Payer: Self-pay | Admitting: Cardiology

## 2014-02-26 ENCOUNTER — Telehealth: Payer: Self-pay | Admitting: Family

## 2014-02-26 NOTE — Telephone Encounter (Signed)
Pt said dr Burnice Logan treated her back in 2013 and the service he provided was outstanding and she would like to get establish with him as her new provider. Can I sch ? Pt had Megan Salon as provider

## 2014-02-27 ENCOUNTER — Encounter: Payer: Self-pay | Admitting: Family

## 2014-02-27 NOTE — Telephone Encounter (Signed)
I am no longer taking new patients

## 2014-02-28 ENCOUNTER — Encounter: Payer: Self-pay | Admitting: Vascular Surgery

## 2014-02-28 NOTE — Telephone Encounter (Signed)
Pt will get est with NP cory

## 2014-03-04 ENCOUNTER — Ambulatory Visit (INDEPENDENT_AMBULATORY_CARE_PROVIDER_SITE_OTHER): Payer: Medicare Other | Admitting: Vascular Surgery

## 2014-03-04 ENCOUNTER — Encounter: Payer: Self-pay | Admitting: Vascular Surgery

## 2014-03-04 VITALS — BP 160/66 | HR 67 | Resp 18 | Ht 62.5 in | Wt 201.9 lb

## 2014-03-04 DIAGNOSIS — I87303 Chronic venous hypertension (idiopathic) without complications of bilateral lower extremity: Secondary | ICD-10-CM | POA: Diagnosis not present

## 2014-03-04 DIAGNOSIS — I2510 Atherosclerotic heart disease of native coronary artery without angina pectoris: Secondary | ICD-10-CM | POA: Diagnosis not present

## 2014-03-04 NOTE — Progress Notes (Signed)
The patient presents today for concern regarding plans for treatment of superficial skin cancers of both lower extremities. She reports that she is been recommended these be treated with the chemical freezing. She is concerned regarding the impact of this and inability to wear compression stockings for approximately 10 days following the procedure. He has been actually compliant in wearing her compression stockings and elevating when possible. She saw Dr. Scot Dock in our office proximal one year ago. She has no history of DVT. I did review her prior venous duplex from 03/06/2013. This shows both superficial and deep venous reflux.  Past Medical History  Diagnosis Date  . HTN (hypertension)   . Hyperlipidemia   . IBS (irritable bowel syndrome)   . PUD (peptic ulcer disease)     secondary to NSAID-1975  . Depression   . Scarlet fever   . Emphysema   . Osteoporosis   . GERD (gastroesophageal reflux disease)   . Hx of adenomatous colonic polyps   . Anxiety   . Hemorrhoids   . Migraine headache   . Rheumatoid arthritis(714.0)   . Diastolic dysfunction   . History of colonoscopy   . DDD (degenerative disc disease)   . Fracture, spinal   . Carotid artery occlusion   . Disseminated superficial actinic porokeratosis     bilateral calves    History  Substance Use Topics  . Smoking status: Former Smoker -- 3.00 packs/day for 30 years    Types: Cigarettes    Quit date: 01/17/1985  . Smokeless tobacco: Never Used     Comment: previous 80 pack year history  . Alcohol Use: No    Family History  Problem Relation Age of Onset  . Heart disease Mother     MI  . Hypertension Mother   . Heart attack Mother   . Stomach cancer Brother   . Cancer Brother   . Heart disease Brother   . Hypertension Brother   . Heart attack Brother   . AAA (abdominal aortic aneurysm) Brother   . Aneurysm Brother     brain  . Melanoma Brother   . Colon cancer Brother   . Heart disease Brother   . Alcohol  abuse    . Heart attack Brother 33  . Allergies Sister   . Hypertension Sister   . Breast cancer Maternal Grandmother   . Brain cancer Father   . Cancer Father     Allergies  Allergen Reactions  . Anoro Ellipta [Umeclidinium-Vilanterol]     HEADACHES  . Azithromycin   . Bee Venom   . Benzocaine   . Bystolic [Nebivolol Hcl]   . Cefdinir   . Ciprofloxacin   . Diphenhydramine Hcl   . Erythromycin   . Indomethacin   . Lasix [Furosemide]     Low tolerance    . Other     Adhesive tape  . Paba Derivatives   . Penicillins   . Prednisone     Low tolerance   . Statins     Low tolerance    . Tobramycin   . Zetia [Ezetimibe]     Low tolerance      Current outpatient prescriptions:  .  ALPRAZolam (XANAX) 0.25 MG tablet, TAKE ONE HALF TO 1 TABLET BY MOUTH AS NEEDED, Disp: 30 tablet, Rfl: 4 .  aspirin 81 MG tablet, Take 81 mg by mouth 2 (two) times daily. , Disp: , Rfl:  .  Calcium Carbonate-Vitamin D 600-400 MG-UNIT per tablet, Take 1 tablet  by mouth 2 (two) times daily. , Disp: , Rfl:  .  carvedilol (COREG) 12.5 MG tablet, Take 1 tablet (12.5 mg total) by mouth 2 (two) times daily with a meal., Disp: 180 tablet, Rfl: 3 .  Flaxseed, Linseed, (FLAXSEED OIL) 1000 MG CAPS, Take 1 capsule by mouth daily at 12 noon. , Disp: , Rfl:  .  fluconazole (DIFLUCAN) 150 MG tablet, Take 1 tablet (150 mg total) by mouth once. Repeat if needed, Disp: 2 tablet, Rfl: 0 .  folic acid (FOLVITE) 1 MG tablet, Take 1 mg by mouth daily at 12 noon. , Disp: , Rfl:  .  hydrochlorothiazide (HYDRODIURIL) 25 MG tablet, TAKE 1 TABLET BY MOUTH EVERY MORNING, Disp: 90 tablet, Rfl: 1 .  KRILL OIL PO, Take 500 mg by mouth., Disp: , Rfl:  .  Lecithin 1200 MG CAPS, Take 1,200 mg by mouth every morning. , Disp: , Rfl:  .  losartan (COZAAR) 100 MG tablet, Take 1 tablet (100 mg total) by mouth daily. (Patient taking differently: Take 100 mg by mouth daily. Cuts 100 mg.Tablet in half and take half (50 mg) in morning and  (50 mg) in evening.), Disp: 90 tablet, Rfl: 3 .  Magnesium 250 MG TABS, Take 1 tablet by mouth every morning. , Disp: , Rfl:  .  Multiple Vitamins-Minerals (CENTRUM SILVER PO), Take 1 tablet by mouth every morning. , Disp: , Rfl:  .  Multiple Vitamins-Minerals (OCUVITE EYE HEALTH FORMULA PO), Take 1 tablet by mouth daily at 12 noon. , Disp: , Rfl:  .  PROAIR HFA 108 (90 BASE) MCG/ACT inhaler, INHALE 2 PUFFS BY MOUTH EVERY 6 HOURS AS NEEDED FOR WHEEZING, Disp: 8.5 g, Rfl: 6 .  tiotropium (SPIRIVA HANDIHALER) 18 MCG inhalation capsule, PLACE 1 CAPSULE INTO THE INHALER EVERY DAY AS DIRECTED, Disp: 30 capsule, Rfl: 6 .  vitamin B-12 (CYANOCOBALAMIN) 500 MCG tablet, Take 500 mcg by mouth every morning. , Disp: , Rfl:   BP 160/66 mmHg  Pulse 67  Resp 18  Ht 5' 2.5" (1.588 m)  Wt 201 lb 14.4 oz (91.581 kg)  BMI 36.32 kg/m2  Body mass index is 36.32 kg/(m^2).       Physical exam she is developed well-nourished female no acute distress 2+ dorsalis pedis pulses bilaterally She has no evidence of open venous ulcers and has no venous varicosities. She does have a very few scattered telangiectasia on both lower extremities. No significant swelling and no hemosiderin deposit  I discussed the treatment with patient. I do feel that it is completely safe for her not to wear compression garments for 1-2 weeks following any needed treatment of her superficial skin cancer. Expanded she has normal arterial flow and that with her level of venous hypertension would not anticipate any wound healing with this. She understands and will see [place] again on as-needed basis

## 2014-03-11 ENCOUNTER — Telehealth: Payer: Self-pay | Admitting: Family

## 2014-03-11 NOTE — Telephone Encounter (Signed)
Message forwarded to [facility] for possible suggestions to prevent OV.

## 2014-03-11 NOTE — Telephone Encounter (Signed)
Pt would like to  see NP to discuss results of bone density test.

## 2014-03-12 ENCOUNTER — Ambulatory Visit (INDEPENDENT_AMBULATORY_CARE_PROVIDER_SITE_OTHER): Payer: Medicare Other | Admitting: Family Medicine

## 2014-03-12 VITALS — BP 148/82 | HR 65 | Temp 97.7°F | Resp 16 | Ht 64.0 in | Wt 204.0 lb

## 2014-03-12 DIAGNOSIS — I2510 Atherosclerotic heart disease of native coronary artery without angina pectoris: Secondary | ICD-10-CM

## 2014-03-12 DIAGNOSIS — J441 Chronic obstructive pulmonary disease with (acute) exacerbation: Secondary | ICD-10-CM

## 2014-03-12 MED ORDER — DOXYCYCLINE HYCLATE 100 MG PO CAPS: 100.0000 mg | ORAL_CAPSULE | Freq: Two times a day (BID) | ORAL | Status: DC

## 2014-03-12 NOTE — Telephone Encounter (Signed)
Pt is aware that Abby Potash is out of the office until Thursday

## 2014-03-12 NOTE — Progress Notes (Signed)
Subjective: 79 year old lady who I have cared for in the past but had not seen for a long time. She has a long history of COPD and related issues. She's been sick for the last 8 or 9 days. She thought she was doing better, but now has gotten worse again. No shortness of breath. Coughing up phlegm. Wheezing more. She is not a smoker. Her primary care physician just quit practice, and she is waiting for a new doctor. She recently had a fall back in January and had a fractured rib. She has been coughing a lot at nighttime. No documented fever.   Objective: Alert and elderly lady pleasant and oriented. Brought in a sheet with her history carefully delineated which we scanned into the computer. Her TMs are normal. Throat clear. Neck supple without nodes. Chest has scattered wheezing throughout. No rales or rhonchi. Heart regular without murmurs. She is coughing.  Assessment: COPD with exacerbation and bronchitis  Plan: Doxycycline Increase frequency of use of her inhaler If not improving will need to give her a course of prednisone even though her rheumatologist wants told her never to take it without talking to him (he is now dead). Return as needed

## 2014-03-12 NOTE — Patient Instructions (Signed)
Take doxycycline 1 twice daily for infection  Increase the frequency of the pro-air to 2 every 4-6 hours  If not improving, either by feeling worse or by dropping of your oxygen saturation, then I think we should reassess with laboratory and x-ray studies and consider putting you on a course of prednisone

## 2014-03-13 NOTE — Telephone Encounter (Signed)
Recommend optimizing calcium (1200 mg/day) and vitamin D (800 IU/day) intake.  Followup: Repeat BMD is appropriate after 2 years or after 1-2 years if starting treatment.  No current follow-up needed.

## 2014-03-19 DIAGNOSIS — L565 Disseminated superficial actinic porokeratosis (DSAP): Secondary | ICD-10-CM | POA: Diagnosis not present

## 2014-03-19 DIAGNOSIS — L570 Actinic keratosis: Secondary | ICD-10-CM | POA: Diagnosis not present

## 2014-03-25 ENCOUNTER — Other Ambulatory Visit: Payer: Self-pay | Admitting: Pulmonary Disease

## 2014-03-28 ENCOUNTER — Ambulatory Visit: Payer: Medicare Other | Admitting: Pulmonary Disease

## 2014-04-02 ENCOUNTER — Encounter: Payer: Self-pay | Admitting: Neurology

## 2014-04-02 ENCOUNTER — Ambulatory Visit (INDEPENDENT_AMBULATORY_CARE_PROVIDER_SITE_OTHER): Payer: Medicare Other | Admitting: Neurology

## 2014-04-02 VITALS — BP 138/76 | HR 62 | Temp 97.6°F | Resp 20 | Ht 62.0 in | Wt 201.0 lb

## 2014-04-02 DIAGNOSIS — I2510 Atherosclerotic heart disease of native coronary artery without angina pectoris: Secondary | ICD-10-CM | POA: Diagnosis not present

## 2014-04-02 DIAGNOSIS — R413 Other amnesia: Secondary | ICD-10-CM

## 2014-04-02 NOTE — Progress Notes (Addendum)
NEUROLOGY FOLLOW UP OFFICE NOTE  Melissa Ferrell [id number]  HISTORY OF PRESENT ILLNESS: Melissa Ferrell is a 79 year old right-handed woman with CAD, CHF, hypertension, COPD, Rheumatoid arthritis, chronic venous insufficiency, depression and anxiety who follows up for memory problems.  Records and labs reviewed.  UPDATE: Since last visit, she reports that her memory has gotten worse.  For example, she forgets which books she had just read the previous week.  She is wondering if it could be related to cerebrovascular disease.  She has stroke risk factors, such as hypertension.  Carotid duplex revealed some plaque but no hemodynamically significant stenosis.  There is no brain imaging in the chart.  She does take ASA 81mg  daily.  HISTORY: She began noticing issues about 4 years ago.  Sometimes, she would be in the middle of a conversation and stop mid-sentence.  She describes it as "half of my brain being erased."  She says she knows what she wants to say but can't get the words out.  Other times, it is related to losing her train of thought.  It lasts only a few seconds.  Also, she has been going to pulmonary rehab since last June.  She began having trouble remembering the names of the therapists that she sees there, even though she has regularly been going there for months.  She has played bridge for many years.  Sometimes when she plays bridge, she will forget what is the bid, what cards are played, and how to go forward with her hand.  She drives and does not get disoriented on familiar routes, but she no longer is able to drive to unfamiliar areas.  She lives in a continuing care center.  She is able to manage her finances and perform her ADLs.  No hallucinations or delusions.  She does have anxiety.  She thinks that her mother may have had dementia.    PAST MEDICAL HISTORY: Past Medical History  Diagnosis Date  . HTN (hypertension)   . Hyperlipidemia   . IBS (irritable bowel syndrome)     . PUD (peptic ulcer disease)     secondary to NSAID-1975  . Depression   . Scarlet fever   . Emphysema   . Osteoporosis   . GERD (gastroesophageal reflux disease)   . Hx of adenomatous colonic polyps   . Anxiety   . Hemorrhoids   . Migraine headache   . Rheumatoid arthritis(714.0)   . Diastolic dysfunction   . History of colonoscopy   . DDD (degenerative disc disease)   . Fracture, spinal   . Carotid artery occlusion   . Disseminated superficial actinic porokeratosis     bilateral calves    MEDICATIONS: Current Outpatient Prescriptions on File Prior to Visit  Medication Sig Dispense Refill  . ALPRAZolam (XANAX) 0.25 MG tablet TAKE ONE HALF TO 1 TABLET BY MOUTH AS NEEDED 30 tablet 4  . aspirin 81 MG tablet Take 81 mg by mouth 2 (two) times daily.     . Calcium Carbonate-Vitamin D 600-400 MG-UNIT per tablet Take 1 tablet by mouth 2 (two) times daily.     . carvedilol (COREG) 12.5 MG tablet Take 1 tablet (12.5 mg total) by mouth 2 (two) times daily with a meal. 180 tablet 3  . doxycycline (VIBRAMYCIN) 100 MG capsule Take 1 capsule (100 mg total) by mouth 2 (two) times daily. 20 capsule 0  . Flaxseed, Linseed, (FLAXSEED OIL) 1000 MG CAPS Take 1 capsule by mouth daily at 12 noon.     Marland Kitchen  fluconazole (DIFLUCAN) 150 MG tablet Take 1 tablet (150 mg total) by mouth once. Repeat if needed 2 tablet 0  . folic acid (FOLVITE) 1 MG tablet Take 1 mg by mouth daily at 12 noon.     . hydrochlorothiazide (HYDRODIURIL) 25 MG tablet TAKE 1 TABLET BY MOUTH EVERY MORNING 90 tablet 1  . KRILL OIL PO Take 500 mg by mouth.    . Lecithin 1200 MG CAPS Take 1,200 mg by mouth every morning.     Marland Kitchen losartan (COZAAR) 100 MG tablet Take 1 tablet (100 mg total) by mouth daily. (Patient taking differently: Take 100 mg by mouth daily. Cuts 100 mg.Tablet in half and take half (50 mg) in morning and (50 mg) in evening.) 90 tablet 3  . Magnesium 250 MG TABS Take 1 tablet by mouth every morning.     . Multiple  Vitamins-Minerals (CENTRUM SILVER PO) Take 1 tablet by mouth every morning.     . Multiple Vitamins-Minerals (OCUVITE EYE HEALTH FORMULA PO) Take 1 tablet by mouth daily at 12 noon.     Marland Kitchen PROAIR HFA 108 (90 BASE) MCG/ACT inhaler INHALE 2 PUFFS BY MOUTH EVERY 6 HOURS AS NEEDED FOR WHEEZING 8.5 g 6  . SPIRIVA HANDIHALER 18 MCG inhalation capsule INHALE THE CONTENTS OF 1 CAPSULE VIA HANDIHALER EVERY DAY AS DIRECTED 30 capsule 0  . vitamin B-12 (CYANOCOBALAMIN) 500 MCG tablet Take 500 mcg by mouth every morning.      No current facility-administered medications on file prior to visit.    ALLERGIES: Allergies  Allergen Reactions  . Anoro Ellipta [Umeclidinium-Vilanterol]     HEADACHES  . Azithromycin   . Bee Venom   . Benzocaine   . Bystolic [Nebivolol Hcl]   . Cefdinir   . Ciprofloxacin   . Diphenhydramine Hcl   . Erythromycin   . Indomethacin   . Lasix [Furosemide]     Low tolerance    . Other     Adhesive tape  . Paba Derivatives   . Penicillins   . Prednisone     Low tolerance   . Statins     Low tolerance    . Tobramycin   . Zetia [Ezetimibe]     Low tolerance     FAMILY HISTORY: Family History  Problem Relation Age of Onset  . Heart disease Mother     MI  . Hypertension Mother   . Heart attack Mother   . Stomach cancer Brother   . Cancer Brother   . Heart disease Brother   . Hypertension Brother   . Heart attack Brother   . AAA (abdominal aortic aneurysm) Brother   . Aneurysm Brother     brain  . Melanoma Brother   . Colon cancer Brother   . Heart disease Brother   . Alcohol abuse    . Heart attack Brother 56  . Allergies Sister   . Hypertension Sister   . Breast cancer Maternal Grandmother   . Brain cancer Father   . Cancer Father     SOCIAL HISTORY: History   Social History  . Marital Status: Divorced    Spouse Name: N/A  . Number of Children: 3  . Years of Education: N/A   Occupational History  . Retired-homemaker    Social History  Main Topics  . Smoking status: Former Smoker -- 3.00 packs/day for 30 years    Types: Cigarettes    Quit date: 01/17/1985  . Smokeless tobacco: Never Used  Comment: previous 80 pack year history  . Alcohol Use: No  . Drug Use: No  . Sexual Activity: No   Other Topics Concern  . Not on file   Social History Narrative    REVIEW OF SYSTEMS: Constitutional: No fevers, chills, or sweats, no generalized fatigue, change in appetite Eyes: No visual changes, double vision, eye pain Ear, nose and throat: No hearing loss, ear pain, nasal congestion, sore throat Cardiovascular: No chest pain, palpitations Respiratory:  No shortness of breath at rest or with exertion, wheezes GastrointestinaI: No nausea, vomiting, diarrhea, abdominal pain, fecal incontinence Genitourinary:  No dysuria, urinary retention or frequency Musculoskeletal:  No neck pain, back pain Integumentary: No rash, pruritus, skin lesions Neurological: as above Psychiatric: No depression, insomnia, anxiety Endocrine: No palpitations, fatigue, diaphoresis, mood swings, change in appetite, change in weight, increased thirst Hematologic/Lymphatic:  No anemia, purpura, petechiae. Allergic/Immunologic: no itchy/runny eyes, nasal congestion, recent allergic reactions, rashes  PHYSICAL EXAM: Filed Vitals:   04/02/14 0941  BP: 138/76  Pulse: 62  Temp: 97.6 F (36.4 C)  Resp: 20   General: No acute distress Head:  Normocephalic/atraumatic Eyes:  Fundoscopic exam without papilledema. Neck: supple, no paraspinal tenderness, full range of motion Heart:  Regular rate and rhythm Lungs:  Clear to auscultation bilaterally Back: No paraspinal tenderness Neurological Exam: alert and oriented to person, place, and time.  Attention span and concentration intact, remote memory intact, fund of knowledge intact.   She states that she is familiar with the [facility] test and remembers the details.  Speech fluent and not dysarthric, language  intact.   Montreal Cognitive Assessment  04/02/2014 10/02/2013  Visuospatial/ Executive (0/5) 5 5  Naming (0/3) 3 3  Attention: Read list of digits (0/2) 2 2  Attention: Read list of letters (0/1) 1 0  Attention: Serial 7 subtraction starting at 100 (0/3) 3 3  Language: Repeat phrase (0/2) 2 1  Language : Fluency (0/1) 1 1  Abstraction (0/2) 2 2  Delayed Recall (0/5) 3 4  Orientation (0/6) 6 6  Total 28 27  Adjusted Score (based on education) 28 27   CN II-XII intact. Bulk and tone normal, muscle strength 5/5 throughout.  Sensation to light touch reduced in feet.  Deep tendon reflexes 1+ in upper extremities and absent in lower extremities.  Finger to nose intact.  Gait normal  IMPRESSION: Memory deficits.  Bedside testing reveals no significant deficits or decline over the past year.  However, she reports concern for worsening memory.  PLAN: We will refer for neuropsychological testing with Dr. Valentina Shaggy.  She will follow up soon after.  If testing reveals evidence of organic cognitive impairment, then we will proceed with brain imaging.  30 minutes spent with patient, over 50% spent discussing possible etiologies and plan.  Metta Clines, DO

## 2014-04-02 NOTE — Patient Instructions (Signed)
1.  We will send you to Dr. Antionette Poles for formal neuropsychological testing to assess for any cognitive/memory impairment.  Follow up soon after.  If there is such evidence, I would then pursue imaging of the brain.

## 2014-04-03 ENCOUNTER — Ambulatory Visit (INDEPENDENT_AMBULATORY_CARE_PROVIDER_SITE_OTHER): Payer: Medicare Other | Admitting: Pulmonary Disease

## 2014-04-03 ENCOUNTER — Encounter: Payer: Self-pay | Admitting: Pulmonary Disease

## 2014-04-03 VITALS — BP 136/66 | HR 67 | Temp 97.0°F | Ht 64.0 in | Wt 204.8 lb

## 2014-04-03 DIAGNOSIS — I2510 Atherosclerotic heart disease of native coronary artery without angina pectoris: Secondary | ICD-10-CM

## 2014-04-03 DIAGNOSIS — J439 Emphysema, unspecified: Secondary | ICD-10-CM | POA: Diagnosis not present

## 2014-04-03 NOTE — Progress Notes (Signed)
   Subjective:    Patient ID: Melissa Ferrell, female    DOB: Jun 15, 1935, 79 y.o.   MRN: [id number]  HPI Patient comes in today for follow-up of her known severe COPD. She also has issues with underlying congestive heart failure and tendency toward fluid retention. We have been having issues with her maintenance medications because of insurance issues. Currently she is only on Spiriva with as needed albuterol.  She has had a recent acute exacerbation about a month ago, and despite treatment, and feels that she has not returned to baseline. She still has significant dyspnea on exertion, but no significant cough or mucus production. Of note, she has never been tried on inhaled corticosteroids.   Review of Systems  Constitutional: Negative for fever, chills and unexpected weight change.  HENT: Positive for congestion and postnasal drip. Negative for dental problem, ear pain, nosebleeds, rhinorrhea, sinus pressure, sneezing, sore throat, trouble swallowing and voice change.   Eyes: Negative for redness, itching and visual disturbance.  Respiratory: Positive for cough, chest tightness, shortness of breath and wheezing. Negative for choking.   Cardiovascular: Negative for chest pain, palpitations and leg swelling.  Gastrointestinal: Negative for nausea, vomiting, abdominal pain and diarrhea.  Genitourinary: Negative for dysuria and difficulty urinating.  Musculoskeletal: Negative for joint swelling and arthralgias.  Skin: Negative for rash.  Neurological: Negative for tremors, syncope and headaches.  Hematological: Does not bruise/bleed easily.  Psychiatric/Behavioral: Negative for dysphoric mood. The patient is not nervous/anxious.        Objective:   Physical Exam Obese female in no acute distress Nose without purulence or discharge noted Neck without lymphadenopathy or thyromegaly Chest with mildly decreased breath sounds, no crackles or active wheezing Cardiac exam with regular rate and  rhythm Lower extremities with 1-2+ edema bilaterally, no cyanosis Alert and oriented, moves all 4 extremities.       Assessment & Plan:

## 2014-04-03 NOTE — Assessment & Plan Note (Signed)
The patient has known severe COPD, and most recently has had an acute exacerbation. Despite aggressive treatment, she feels that she has not returned to baseline, and that she is only 40% improved from her recent flare. She is on Spiriva alone, and she had insurance issues with LABA/LAMA combinations. Given the fact that she is having worsening symptoms and a recent flare, I will give her a trial of inhaled corticosteroids. I also stressed to her the importance of exercise and aggressive weight loss.

## 2014-04-03 NOTE — Patient Instructions (Signed)
Stay on spiriva as you are doing Start on breo 100, one inhalation each am.  Rinse mouth well after using. If the samples work well for you, let us know and we can send in prescription.  Work on [employer] loss Will see you back in 61mos for your regular visit, but let us know if you are not doing well.

## 2014-04-08 ENCOUNTER — Telehealth: Payer: Self-pay | Admitting: General Practice

## 2014-04-08 NOTE — Telephone Encounter (Signed)
I did open up some more medicare slots, but if those are full would advise she see Tommi Rumps - he has special training in caring for elderly patients. I think she will be very happy.  Thanks.

## 2014-04-08 NOTE — Telephone Encounter (Signed)
Dr Maudie Mercury Melissa Ferrell would like to est with you and be seen in June 2016. Melissa Ferrell has medicare. Can I create a slot? Melissa Ferrell does not want to see cory nor wait until 2017

## 2014-04-09 NOTE — Telephone Encounter (Signed)
Dr Maudie Mercury has one trans spot open for the year on 04/30/14 at [place]. I sch pt for this time and left her message.

## 2014-04-21 ENCOUNTER — Encounter: Payer: Self-pay | Admitting: Pulmonary Disease

## 2014-04-22 ENCOUNTER — Encounter: Payer: Self-pay | Admitting: Cardiology

## 2014-04-22 ENCOUNTER — Other Ambulatory Visit: Payer: Self-pay | Admitting: Pulmonary Disease

## 2014-04-22 NOTE — Telephone Encounter (Signed)
Options for breo can include advair, symbicort, and dulera.  They all are essentially the same medication, and all have the exact same potential side effects.  When prescribing meds, we think about the benefits (such as breathing) vs the potential side effects which the majority of time are not life threatening or are tolerable.  Every person is different, and has different thresholds to stop a medication based on side effects.  She can either stay on breo for a little longer to see how she does, we can change her to something else, or she can stop breo and stay on spiriva and accept her level of breathing.  Let me know what she decides.

## 2014-04-22 NOTE — Telephone Encounter (Signed)
Patient Instructions     Stay on spiriva as you are doing Start on breo 100, one inhalation each am. Rinse mouth well after using. If the samples work well for you, let us know and we can send in prescription.  Work on [employer] loss Will see you back in 56mos for your regular visit, but let us know if you are not doing well.    [place] would you like to have patient stop the [facility] and try something else or continue on and send Rx--please see e-mail from patient. Thanks.

## 2014-04-23 NOTE — Progress Notes (Signed)
HPI: FU congestive heart failure and hypertension. Myoview in May of 2011. Perfusion was normal. The study was not gated. Echocardiogram in August 2013 showed normal LV function. We felt that her dyspnea was most likely secondary to COPD. Carotid Dopplers in Sept 2015 showed no hemodynamically significant stenosis. Since I last saw her, she has some dyspnea on exertion but no orthopnea, PND, pedal edema or chest pain.  Current Outpatient Prescriptions  Medication Sig Dispense Refill  . ALPRAZolam (XANAX) 0.25 MG tablet TAKE ONE HALF TO 1 TABLET BY MOUTH AS NEEDED 30 tablet 4  . aspirin 81 MG tablet Take 81 mg by mouth 2 (two) times daily.     . Calcium Carbonate-Vitamin D 600-400 MG-UNIT per tablet Take 1 tablet by mouth 2 (two) times daily.     . carvedilol (COREG) 12.5 MG tablet Take 1 tablet (12.5 mg total) by mouth 2 (two) times daily with a meal. 180 tablet 3  . Flaxseed, Linseed, (FLAXSEED OIL) 1000 MG CAPS Take 1 capsule by mouth daily at 12 noon.     . Fluticasone Furoate-Vilanterol 100-25 MCG/INH AEPB Inhale 1 puff into the lungs daily.    . folic acid (FOLVITE) 1 MG tablet Take 1 mg by mouth daily at 12 noon.     . hydrochlorothiazide (HYDRODIURIL) 25 MG tablet TAKE 1 TABLET BY MOUTH EVERY MORNING 90 tablet 1  . KRILL OIL PO Take 500 mg by mouth.    . Lecithin 1200 MG CAPS Take 1,200 mg by mouth every morning.     Marland Kitchen losartan (COZAAR) 50 MG tablet Take 50 mg by mouth 2 (two) times daily.    . Magnesium 250 MG TABS Take 1 tablet by mouth every morning.     . Multiple Vitamins-Minerals (CENTRUM SILVER PO) Take 1 tablet by mouth every morning.     . Multiple Vitamins-Minerals (OCUVITE EYE HEALTH FORMULA PO) Take 1 tablet by mouth daily at 12 noon.     Marland Kitchen PROAIR HFA 108 (90 BASE) MCG/ACT inhaler INHALE 2 PUFFS BY MOUTH EVERY 6 HOURS AS NEEDED FOR WHEEZING 8.5 g 6  . SPIRIVA HANDIHALER 18 MCG inhalation capsule INHALE THE CONTENTS OF 1 CAPSULE VIA HANDIHALER BY MOUTH EVERY DAY AS  DIRECTED 30 capsule 3  . vitamin B-12 (CYANOCOBALAMIN) 500 MCG tablet Take 500 mcg by mouth every morning.      No current facility-administered medications for this visit.     Past Medical History  Diagnosis Date  . HTN (hypertension)   . Hyperlipidemia   . IBS (irritable bowel syndrome)   . PUD (peptic ulcer disease)     secondary to NSAID-1975  . Depression   . Scarlet fever   . Emphysema   . Osteoporosis   . GERD (gastroesophageal reflux disease)   . Hx of adenomatous colonic polyps   . Anxiety   . Hemorrhoids   . Migraine headache   . Rheumatoid arthritis(714.0)   . Diastolic dysfunction   . History of colonoscopy   . DDD (degenerative disc disease)   . Fracture, spinal   . Carotid artery occlusion   . Disseminated superficial actinic porokeratosis     bilateral calves    Past Surgical History  Procedure Laterality Date  . Thyroglossal duct cyst      1943, 1944, 1973  . Chronically infected lymph node removed  1973    History   Social History  . Marital Status: Divorced    Spouse Name: N/A  .  Number of Children: 3  . Years of Education: N/A   Occupational History  . Retired-homemaker    Social History Main Topics  . Smoking status: Former Smoker -- 3.00 packs/day for 30 years    Types: Cigarettes    Quit date: 01/17/1985  . Smokeless tobacco: Never Used     Comment: previous 80 pack year history  . Alcohol Use: No  . Drug Use: No  . Sexual Activity: No   Other Topics Concern  . Not on file   Social History Narrative    ROS: no fevers or chills, productive cough, hemoptysis, dysphasia, odynophagia, melena, hematochezia, dysuria, hematuria, rash, seizure activity, orthopnea, PND, pedal edema, claudication. Remaining systems are negative.  Physical Exam: Well-developed well-nourished in no acute distress.  Skin is warm and dry.  HEENT is normal.  Neck is supple.  Chest is clear to auscultation with normal expansion.  Cardiovascular exam is  regular rate and rhythm.  Abdominal exam nontender or distended. No masses palpated. Extremities show no edema. neuro grossly intact

## 2014-04-24 ENCOUNTER — Encounter: Payer: Self-pay | Admitting: Cardiology

## 2014-04-24 ENCOUNTER — Ambulatory Visit (INDEPENDENT_AMBULATORY_CARE_PROVIDER_SITE_OTHER): Payer: Medicare Other | Admitting: Cardiology

## 2014-04-24 VITALS — BP 168/84 | HR 60 | Ht 64.0 in | Wt 204.0 lb

## 2014-04-24 DIAGNOSIS — I2510 Atherosclerotic heart disease of native coronary artery without angina pectoris: Secondary | ICD-10-CM

## 2014-04-24 DIAGNOSIS — I10 Essential (primary) hypertension: Secondary | ICD-10-CM

## 2014-04-24 DIAGNOSIS — R0989 Other specified symptoms and signs involving the circulatory and respiratory systems: Secondary | ICD-10-CM | POA: Diagnosis not present

## 2014-04-24 DIAGNOSIS — F411 Generalized anxiety disorder: Secondary | ICD-10-CM | POA: Diagnosis not present

## 2014-04-24 MED ORDER — CARVEDILOL 12.5 MG PO TABS: 18.7500 mg | ORAL_TABLET | Freq: Two times a day (BID) | ORAL | Status: DC

## 2014-04-24 MED ORDER — HYDROCHLOROTHIAZIDE 25 MG PO TABS: ORAL_TABLET | ORAL | Status: DC

## 2014-04-24 MED ORDER — LOSARTAN POTASSIUM 50 MG PO TABS: 50.0000 mg | ORAL_TABLET | Freq: Two times a day (BID) | ORAL | Status: DC

## 2014-04-24 NOTE — Patient Instructions (Addendum)
Your physician wants you to follow-up in: [place] will receive a reminder letter in the mail two months in advance. If you don't receive a letter, please call our office to schedule the follow-up appointment.   INCREASE CARVEDILOL TO 18.75 MG TWICE DAILY= 1 AND 1/2 OF THE 12.5 MG TABLETS TWICE DAILY  Your physician has requested that you have an abdominal aorta duplex. During this test, an ultrasound is used to evaluate the aorta. Allow 30 minutes for this exam. Do not eat after midnight the day before and avoid carbonated beverages

## 2014-04-24 NOTE — Assessment & Plan Note (Signed)
Mild on most recent carotid Dopplers.

## 2014-04-24 NOTE — Assessment & Plan Note (Signed)
Also with family history of abdominal aortic aneurysm. Schedule ultrasound to exclude.

## 2014-04-24 NOTE — Assessment & Plan Note (Signed)
Blood pressure elevated. Increase carvedilol to 18.75 mg by mouth twice a day.

## 2014-04-24 NOTE — Assessment & Plan Note (Deleted)
Also with family history of abdominal aortic aneurysm. Schedule ultrasound to exclude.

## 2014-04-24 NOTE — Assessment & Plan Note (Signed)
Continue diet. Intolerant to statins. 

## 2014-04-29 ENCOUNTER — Ambulatory Visit (HOSPITAL_COMMUNITY)
Admission: RE | Admit: 2014-04-29 | Discharge: 2014-04-29 | Disposition: A | Discharge status: Home / Self Care | Payer: Medicare Other | Source: Ambulatory Visit | Attending: Cardiovascular Disease | Admitting: Cardiovascular Disease

## 2014-04-29 ENCOUNTER — Encounter (HOSPITAL_COMMUNITY): Payer: Medicare Other

## 2014-04-29 DIAGNOSIS — I10 Essential (primary) hypertension: Secondary | ICD-10-CM | POA: Insufficient documentation

## 2014-04-29 DIAGNOSIS — R0989 Other specified symptoms and signs involving the circulatory and respiratory systems: Secondary | ICD-10-CM | POA: Diagnosis not present

## 2014-04-29 DIAGNOSIS — I700 Atherosclerosis of aorta: Secondary | ICD-10-CM | POA: Insufficient documentation

## 2014-04-29 NOTE — Progress Notes (Signed)
Abdominal aortic duplex completed. [place]

## 2014-04-30 ENCOUNTER — Ambulatory Visit (INDEPENDENT_AMBULATORY_CARE_PROVIDER_SITE_OTHER): Payer: Medicare Other | Admitting: Family Medicine

## 2014-04-30 ENCOUNTER — Encounter: Payer: Self-pay | Admitting: Family Medicine

## 2014-04-30 VITALS — BP 140/88 | HR 68 | Temp 97.5°F | Ht 63.75 in | Wt 202.5 lb

## 2014-04-30 DIAGNOSIS — I2510 Atherosclerotic heart disease of native coronary artery without angina pectoris: Secondary | ICD-10-CM | POA: Diagnosis not present

## 2014-04-30 DIAGNOSIS — E785 Hyperlipidemia, unspecified: Secondary | ICD-10-CM | POA: Diagnosis not present

## 2014-04-30 DIAGNOSIS — I10 Essential (primary) hypertension: Secondary | ICD-10-CM

## 2014-04-30 DIAGNOSIS — J439 Emphysema, unspecified: Secondary | ICD-10-CM | POA: Diagnosis not present

## 2014-04-30 DIAGNOSIS — F419 Anxiety disorder, unspecified: Secondary | ICD-10-CM

## 2014-04-30 DIAGNOSIS — I739 Peripheral vascular disease, unspecified: Secondary | ICD-10-CM

## 2014-04-30 DIAGNOSIS — R600 Localized edema: Secondary | ICD-10-CM | POA: Diagnosis not present

## 2014-04-30 DIAGNOSIS — J449 Chronic obstructive pulmonary disease, unspecified: Secondary | ICD-10-CM

## 2014-04-30 DIAGNOSIS — F39 Unspecified mood [affective] disorder: Secondary | ICD-10-CM

## 2014-04-30 DIAGNOSIS — F329 Major depressive disorder, single episode, unspecified: Secondary | ICD-10-CM | POA: Insufficient documentation

## 2014-04-30 DIAGNOSIS — F418 Other specified anxiety disorders: Secondary | ICD-10-CM

## 2014-04-30 NOTE — Patient Instructions (Addendum)
BEFORE YOU LEAVE: -schedule lab appt - come fasting -follow up in 3-4 months  I advise that you do not use the xanax for sleep. Please try some of the options below.  FOR IMPROVED SLEEP AND TO RESET YOUR SLEEP SCHEDULE: []  exercise 30 minutes daily  []  go to bed and wake up at the same time  []  keep bedroom cool, dark and quiet  []  reserve bed for sleep - do not read, watch TV, etc in bed  []  If you toss and turn more then 15-20 minutes get out of bed and list thoughts/do quite activity then go back to bed; repeat as needed; do not worry about when you eventually fall asleep - still get up at the same time and turn on lights and take shower  [] get counseling  []  some people find that a half dose of melatonin or unisom on a few nights per week is helpful initially for a few weeks  [] seek help and treat any depression or anxiety with counseling on a regular basis - counseling can also help for sleep  [] prescription strength sleep medications should only be used in severe cases of insomnia if other measures fail and should be used sparingly

## 2014-04-30 NOTE — Progress Notes (Addendum)
HPI:  Melissa Ferrell is here to establish care. She reports has a complicated PMH and a long list of medication intolerances. She sees multiple specialists. 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  Has the following chronic problems that require follow up and concerns today:  GAD/Hx of Major Depression: -hx of hospitalization in the past after divorce from husband remotely -hx of generalized anxiety and panic attack in the past -mild depressed mood intermittently since, takes 1/2 of xanax 3 times per week to sleep if needed -denies: SI, thoughts of self harm  HDL: -she reports does not tolerate any medications for this -takes krill oil -reports couldn't move legs with statins -trying to work on exercise- wi-fit; working on diet  HTN/Carotid art dz/PAD/LE edema: -sees cardiologist and vascular doctor for this -meds: asa 81mg , coreg 12.5 (she take 1.5 tablets), hctz 25mg , losartan 50mg  -Denies: CP, palpations, swelling -wears compression socks  COPD w emphysema: -sees pulmonologist for this -meds: spiriva, proair - she used to take breo but not taking any longer  Memory Loss: -seeing neurologist and per last notes advising to undergo neuropsychological testing as felt was related to anxiety  Osteoporosis: -reports can't take ANY medications for this - does not tolerate -on calcium and Vit D -she is cautious and uses cane   ROS negative for unless reported above: fevers, unintentional weight loss, hearing or vision loss, chest pain, palpitations, struggling to breath, hemoptysis, melena, hematochezia, hematuria, falls, loc, si, thoughts of self harm  Past Medical History  Diagnosis Date  . HTN (hypertension)   . Hyperlipidemia     intol to statins  . IBS (irritable bowel syndrome)   . Anxiety and depression   . Carotid artery disease     hx CAD,  dCHF listed in [place] - sees cardiologist, vasc and neurologist  . COPD (chronic obstructive pulmonary disease)     sees pulmonology  . Osteoporosis   . GERD (gastroesophageal reflux disease)     hx PUD 2ndary to NSAIDs 1975  . Hx of adenomatous colonic polyps   . Hemorrhoids   . Migraine headache   . Rheumatoid arthritis(714.0)     per her report, in remission for many years per her report  . DDD (degenerative disc disease)   . Fracture, spinal   . Disseminated superficial actinic porokeratosis     bilateral calves  . Varicose veins     hx LE edema  . Memory loss     seen by neurology in 2015 and referred to neuropsych for anxiety  . RHEUMATIC FEVER, HX OF 10/15/2006    Qualifier: Diagnosis of  By: Jenny Reichmann MD, Hunt Oris     Past Surgical History  Procedure Laterality Date  . Thyroglossal duct cyst      1943, 1944, 1973  . Chronically infected lymph node removed  1973    Family History  Problem Relation Age of Onset  . Heart disease Mother     MI  . Hypertension Mother   . Heart attack Mother   . Stomach cancer Brother   . Cancer Brother   . Heart disease Brother   . Hypertension Brother   . Heart attack Brother   . AAA (abdominal aortic aneurysm) Brother   . Aneurysm Brother     brain  . Melanoma Brother   . Colon cancer Brother   .  Heart disease Brother   . Alcohol abuse    . Heart attack Brother 60  . Allergies Sister   . Hypertension Sister   . Breast cancer Maternal Grandmother   . Brain cancer Father   . Cancer Father     History   Social History  . Marital Status: Divorced    Spouse Name: N/A  . Number of Children: 3  . Years of Education: N/A   Occupational History  . Retired-homemaker    Social History Main Topics  . Smoking status: Former Smoker -- 3.00 packs/day for 30 years    Types: Cigarettes    Quit date: 01/17/1985  . Smokeless tobacco: Never Used     Comment: previous 80 pack year history  . Alcohol Use: No  . Drug Use: No  . Sexual  Activity: No   Other Topics Concern  . None   Social History Narrative   Work or School: volunteers at [employer] Situation: lives at [place]:       Lifestyle: no regular exercise; diet is ok           Current outpatient prescriptions:  .  ALPRAZolam (XANAX) 0.25 MG tablet, TAKE ONE HALF TO 1 TABLET BY MOUTH AS NEEDED, Disp: 30 tablet, Rfl: 4 .  ARTIFICIAL TEAR OP, Apply to eye., Disp: , Rfl:  .  aspirin 81 MG tablet, Take 81 mg by mouth 2 (two) times daily. , Disp: , Rfl:  .  Calcium Carbonate-Vitamin D 600-400 MG-UNIT per tablet, Take 1 tablet by mouth 2 (two) times daily. , Disp: , Rfl:  .  carvedilol (COREG) 12.5 MG tablet, Take 1.5 tablets (18.75 mg total) by mouth 2 (two) times daily with a meal., Disp: 270 tablet, Rfl: 3 .  Flaxseed, Linseed, (FLAXSEED OIL) 1000 MG CAPS, Take 1 capsule by mouth daily at 12 noon. , Disp: , Rfl:  .  Fluticasone Furoate-Vilanterol 100-25 MCG/INH AEPB, Inhale 1 puff into the lungs daily., Disp: , Rfl:  .  folic acid (FOLVITE) 1 MG tablet, Take 1 mg by mouth daily at 12 noon. , Disp: , Rfl:  .  hydrochlorothiazide (HYDRODIURIL) 25 MG tablet, TAKE 1 TABLET BY MOUTH EVERY MORNING, Disp: 90 tablet, Rfl: 3 .  KRILL OIL PO, Take 500 mg by mouth., Disp: , Rfl:  .  Lecithin 1200 MG CAPS, Take 1,200 mg by mouth every morning. , Disp: , Rfl:  .  losartan (COZAAR) 50 MG tablet, Take 1 tablet (50 mg total) by mouth 2 (two) times daily., Disp: 180 tablet, Rfl: 3 .  Magnesium 250 MG TABS, Take 1 tablet by mouth every morning. , Disp: , Rfl:  .  Multiple Vitamins-Minerals (CENTRUM SILVER PO), Take 1 tablet by mouth every morning. , Disp: , Rfl:  .  Multiple Vitamins-Minerals (OCUVITE EYE HEALTH FORMULA PO), Take 1 tablet by mouth daily at 12 noon. , Disp: , Rfl:  .  NON FORMULARY, Occuvite, Disp: , Rfl:  .  PROAIR HFA 108 (90 BASE) MCG/ACT inhaler, INHALE 2 PUFFS BY MOUTH EVERY 6 HOURS AS NEEDED FOR WHEEZING, Disp: 8.5 g,  Rfl: 6 .  SPIRIVA HANDIHALER 18 MCG inhalation capsule, INHALE THE CONTENTS OF 1 CAPSULE VIA HANDIHALER BY MOUTH EVERY DAY AS DIRECTED, Disp: 30 capsule, Rfl: 3 .  vitamin B-12 (CYANOCOBALAMIN) 500 MCG tablet, Take 500 mcg by mouth every morning. , Disp: , Rfl:   EXAM:  Filed Vitals:  04/30/14 0856  BP: 140/88  Pulse: 68  Temp: 97.5 F (36.4 C)    Body mass index is 35.04 kg/(m^2).  GENERAL: vitals reviewed and listed above, alert, oriented, appears well hydrated and in no acute distress  HEENT: atraumatic, conjunttiva clear, no obvious abnormalities on inspection of external nose and ears  NECK: no obvious masses on inspection  LUNGS: clear to auscultation bilaterally, no wheezes, rales or rhonchi, good air movement  CV: HRRR, no peripheral edema  MS: moves all extremities without noticeable abnormality  PSYCH: pleasant and cooperative, no obvious depression or anxiety  ASSESSMENT AND PLAN:  Discussed the following assessment and plan:  Hyperlipemia - Plan: Lipid Panel -difficult to tx given her statin intol -lifestyle recs  Essential hypertension - Plan: Basic metabolic panel -stab;e  Pulmonary emphysema, unspecified emphysema type -stable, managed by her pulmonologist, stressed importance of adherence to tx  Bilateral edema of lower extremity  Depression and Anxiety - hospitalized in 1995 -discussed tx options, advised I do not recommend chronic benzo use for sleep and discussed other options and advised very slowly tapering off - she only uses a few time per week so withdrawal unlikely -advised CBT  PVD (peripheral vascular disease) -stable --reviewed cards notes, healthy lifestyle advised, compression  -We reviewed the PMH, PSH, FH, SH, Meds and Allergies. -We provided refills for any medications we will prescribe as needed. -We addressed current concerns per orders and patient instructions. -We have asked for records for pertinent exams, studies,  vaccines and notes from previous providers. -We have advised patient to follow up per instructions below.   -Patient advised to return or notify a doctor immediately if symptoms worsen or persist or new concerns arise.  Patient Instructions  BEFORE YOU LEAVE: -schedule lab appt - come fasting -follow up in 3-4 months  I advise that you do not use the xanax for sleep. Please try some of the options below.  FOR IMPROVED SLEEP AND TO RESET YOUR SLEEP SCHEDULE: []  exercise 30 minutes daily  []  go to bed and wake up at the same time  []  keep bedroom cool, dark and quiet  []  reserve bed for sleep - do not read, watch TV, etc in bed  []  If you toss and turn more then 15-20 minutes get out of bed and list thoughts/do quite activity then go back to bed; repeat as needed; do not worry about when you eventually fall asleep - still get up at the same time and turn on lights and take shower  [] get counseling  []  some people find that a half dose of melatonin or unisom on a few nights per week is helpful initially for a few weeks  [] seek help and treat any depression or anxiety with counseling on a regular basis - counseling can also help for sleep  [] prescription strength sleep medications should only be used in severe cases of insomnia if other measures fail and should be used sparingly       KIM, Jarrett Soho R.

## 2014-04-30 NOTE — Progress Notes (Signed)
Pre visit review using our clinic review tool, if applicable. No additional management support is needed unless otherwise documented below in the visit note. 

## 2014-05-02 DIAGNOSIS — I739 Peripheral vascular disease, unspecified: Secondary | ICD-10-CM | POA: Insufficient documentation

## 2014-05-02 DIAGNOSIS — F39 Unspecified mood [affective] disorder: Secondary | ICD-10-CM | POA: Insufficient documentation

## 2014-05-07 ENCOUNTER — Other Ambulatory Visit (INDEPENDENT_AMBULATORY_CARE_PROVIDER_SITE_OTHER): Payer: Medicare Other

## 2014-05-07 DIAGNOSIS — E785 Hyperlipidemia, unspecified: Secondary | ICD-10-CM | POA: Diagnosis not present

## 2014-05-07 DIAGNOSIS — I10 Essential (primary) hypertension: Secondary | ICD-10-CM | POA: Diagnosis not present

## 2014-05-07 LAB — LIPID PANEL
CHOLESTEROL: 205 mg/dL — AB (ref 0–200)
HDL: 48.2 mg/dL (ref >39.00)
LDL CALC: 133 mg/dL — AB (ref 0–99)
NonHDL: 156.8
TRIGLYCERIDES: 120 mg/dL (ref 0.0–149.0)
Total CHOL/HDL Ratio: 4
VLDL: 24 mg/dL (ref 0.0–40.0)

## 2014-05-07 LAB — BASIC METABOLIC PANEL
BUN: 14 mg/dL (ref 6–23)
CALCIUM: 9.4 mg/dL (ref 8.4–10.5)
CHLORIDE: 102 meq/L (ref 96–112)
CO2: 29 mEq/L (ref 19–32)
CREATININE: 0.92 mg/dL (ref 0.40–1.20)
GFR: 62.94 mL/min (ref >60.00)
Glucose, Bld: 93 mg/dL (ref 70–99)
Potassium: 3.8 mEq/L (ref 3.5–5.1)
Sodium: 138 mEq/L (ref 135–145)

## 2014-05-09 ENCOUNTER — Encounter: Payer: Self-pay | Admitting: Cardiology

## 2014-05-09 ENCOUNTER — Encounter: Payer: Self-pay | Admitting: Neurology

## 2014-05-09 MED ORDER — CARVEDILOL 12.5 MG PO TABS: 12.5000 mg | ORAL_TABLET | Freq: Two times a day (BID) | ORAL | Status: DC

## 2014-05-09 MED ORDER — CARVEDILOL 6.25 MG PO TABS: 6.2500 mg | ORAL_TABLET | Freq: Two times a day (BID) | ORAL | Status: DC

## 2014-05-13 ENCOUNTER — Encounter: Payer: Self-pay | Admitting: Family Medicine

## 2014-05-13 ENCOUNTER — Encounter: Payer: Self-pay | Admitting: Neurology

## 2014-06-11 ENCOUNTER — Telehealth: Payer: Self-pay | Admitting: *Deleted

## 2014-06-11 NOTE — Telephone Encounter (Signed)
I called the pt and informed her Dr Maudie Mercury read the letter she sent regarding depression, L-methylfolate and advised this is available over the counter if she wanted to try this.  I also informed her Dr Maudie Mercury she may want to see an integrative medicine doctor such as  [facility] in [place] or [place] if she wants to do testing or wants more information on supplements.  She stated the test is not recommended for screening but an integrative medicine doctor may know how to interpret and use the results for her symptoms.  Patient stated she does not get on the highway nor does she have any family members to drive her but appreciates Dr Maudie Mercury reviewing this.

## 2014-07-01 ENCOUNTER — Ambulatory Visit (INDEPENDENT_AMBULATORY_CARE_PROVIDER_SITE_OTHER)
Admission: RE | Admit: 2014-07-01 | Discharge: 2014-07-01 | Disposition: A | Discharge status: Home / Self Care | Payer: Medicare Other | Source: Ambulatory Visit | Attending: Pulmonary Disease | Admitting: Pulmonary Disease

## 2014-07-01 ENCOUNTER — Encounter: Payer: Self-pay | Admitting: Pulmonary Disease

## 2014-07-01 ENCOUNTER — Ambulatory Visit (INDEPENDENT_AMBULATORY_CARE_PROVIDER_SITE_OTHER): Payer: Medicare Other | Admitting: Pulmonary Disease

## 2014-07-01 ENCOUNTER — Ambulatory Visit: Payer: Medicare Other | Admitting: Psychology

## 2014-07-01 VITALS — BP 138/84 | HR 78 | Ht 64.0 in | Wt 199.0 lb

## 2014-07-01 DIAGNOSIS — R0602 Shortness of breath: Secondary | ICD-10-CM

## 2014-07-01 DIAGNOSIS — I2510 Atherosclerotic heart disease of native coronary artery without angina pectoris: Secondary | ICD-10-CM

## 2014-07-01 DIAGNOSIS — J449 Chronic obstructive pulmonary disease, unspecified: Secondary | ICD-10-CM | POA: Diagnosis not present

## 2014-07-01 DIAGNOSIS — J432 Centrilobular emphysema: Secondary | ICD-10-CM | POA: Diagnosis not present

## 2014-07-01 NOTE — Assessment & Plan Note (Signed)
She has been experiencing worsening COPD symptoms in that she can only walk a few minutes without getting short of breath. Further, she had an exacerbation of COPD earlier this year and has not returned to baseline. She has been difficult to treat and that she's had intolerances to many inhaled therapies. Based on her worsening symptoms and the recent exacerbation she needs the addition of a second long-acting bronchodilation her. I believe that Stiolto would be the best choice as she is intolerant to Anoro.  Plan: Trial of Stiolto Chest x-ray Pulmonary function test Return to clinic 6 weeks

## 2014-07-01 NOTE — Assessment & Plan Note (Signed)
This problem has been worsening recently. She does not appear to be volume overloaded on my exam. Her lung exam was normal.  Plan: Check full pulmonary function testing as well as a chest x-ray

## 2014-07-01 NOTE — Progress Notes (Signed)
Subjective:    Patient ID: Melissa Ferrell, female    DOB: Jan 22, 1935, 79 y.o.   MRN: [id number]  Synopsis: COPD followed by Dr. Gwenette Greet prior to 06/2014; she has a history of rheumatoid arthritis. She previously smoked 3 ppd and quit in 1986.  PFT's 09/12/2011:  FEV1  1.37 (79%) ratio 47, FEV1 increased 14% with BD, +++ airtrapping, DLCO 60% On spiriva, arcapta added 11/2011 as a trial with ++response.  Pt wanted to take arcapta alone and stop spiriva  >> saw worsening breathing with arcapta alone, so restarted spiriva  Worsening on spiriva/arcapta, no coverage for stiolto; Breo did not help. Trial of breo 03/2014   HPI Chief Complaint  Patient presents with  . Advice Only    former [place] pt treated for emphysema.  Pt c/o sob with exertion, worse with warm weather.     She says that she continues to have shortness of breath and is frustrated due to the duration of her symptoms and lack of improvement.   She says that she suffers primarily from shortness of breath primarily. She doesn't cough much.  She has been on a variety of medications for her COPD in the last year.  Specifically she was treated with Arcapta and Breo.  She said that Breo was aweful. She said that [facility] was associated with headache, sinus congestion and severe anxiety.   She has COPD exacerbations occassinally and was last treated for COPD by Dr. Linna Darner recently.  She was prescribed proAir every two hours and antibiotics.  She turned down prednisone.    She relocated to a retirement home about 7 years ago.  About one year into this she had an exacerbation of COPD.    She is currently using Spiriva alone for COPD, but nothing else.   She takes proAir only once a week for shortness of breath and chest tightness.    She doesn't feel like her COPD has ever been greatly controlled. Arcapta helped some, but it wore off. She tried Anoro but she says that she doesn't think it helped much and she said it really made her head hurt.     Past Medical History  Diagnosis Date  . HTN (hypertension)   . Hyperlipidemia     intol to statins  . IBS (irritable bowel syndrome)   . Anxiety and depression   . Carotid artery disease     hx CAD, dCHF listed in [place] - sees cardiologist, vasc and neurologist  . COPD (chronic obstructive pulmonary disease)     sees pulmonology  . Osteoporosis   . GERD (gastroesophageal reflux disease)     hx PUD 2ndary to NSAIDs 1975  . Hx of adenomatous colonic polyps   . Hemorrhoids   . Migraine headache   . Rheumatoid arthritis(714.0)     per her report, in remission for many years per her report  . DDD (degenerative disc disease)   . Fracture, spinal   . Disseminated superficial actinic porokeratosis     bilateral calves  . Varicose veins     hx LE edema  . Memory loss     seen by neurology in 2015 and referred to neuropsych for anxiety  . RHEUMATIC FEVER, HX OF 10/15/2006    Qualifier: Diagnosis of  By: Jenny Reichmann MD, Hunt Oris       Review of Systems  Constitutional: Positive for fatigue. Negative for fever and diaphoresis.  HENT: Negative for rhinorrhea, sinus pressure and sneezing.   Respiratory: Positive for shortness  of breath. Negative for cough and wheezing.   Cardiovascular: Positive for leg swelling. Negative for chest pain and palpitations.       Objective:   Physical Exam Filed Vitals:   07/01/14 1413  BP: 138/84  Pulse: 78  Height: '5\' 4"'$  (1.626 m)  Weight: 199 lb (90.266 kg)  SpO2: 96%   RA  Gen: well appearing HENT: OP clear, TM's clear, neck supple PULM: CTA B, normal percussion CV: RRR, no mgr, trace edema GI: BS+, soft, nontender Derm: no cyanosis or rash Psyche: normal mood and affect  Recent cardiology records were reviewed were her hypertension and leg swelling or manage Urgent care results reviewed, no recent chest imaging available       Assessment & Plan:  COPD (chronic obstructive pulmonary disease) with emphysema She has been experiencing  worsening COPD symptoms in that she can only walk a few minutes without getting short of breath. Further, she had an exacerbation of COPD earlier this year and has not returned to baseline. She has been difficult to treat and that she's had intolerances to many inhaled therapies. Based on her worsening symptoms and the recent exacerbation she needs the addition of a second long-acting bronchodilation her. I believe that Stiolto would be the best choice as she is intolerant to Anoro.  Plan: Trial of Stiolto Chest x-ray Pulmonary function test Return to clinic 6 weeks  Shortness of breath This problem has been worsening recently. She does not appear to be volume overloaded on my exam. Her lung exam was normal.  Plan: Check full pulmonary function testing as well as a chest x-ray     Current outpatient prescriptions:  .  ALPRAZolam (XANAX) 0.25 MG tablet, TAKE ONE HALF TO 1 TABLET BY MOUTH AS NEEDED, Disp: 30 tablet, Rfl: 4 .  ARTIFICIAL TEAR OP, Apply to eye., Disp: , Rfl:  .  aspirin 81 MG tablet, Take 81 mg by mouth 2 (two) times daily. , Disp: , Rfl:  .  Calcium Carbonate-Vitamin D 600-400 MG-UNIT per tablet, Take 1 tablet by mouth 2 (two) times daily. , Disp: , Rfl:  .  carvedilol (COREG) 12.5 MG tablet, Take 1 tablet (12.5 mg total) by mouth 2 (two) times daily with a meal., Disp: 270 tablet, Rfl: 3 .  carvedilol (COREG) 6.25 MG tablet, Take 1 tablet (6.25 mg total) by mouth 2 (two) times daily., Disp: 180 tablet, Rfl: 3 .  Flaxseed, Linseed, (FLAXSEED OIL) 1000 MG CAPS, Take 1 capsule by mouth daily at 12 noon. , Disp: , Rfl:  .  folic acid (FOLVITE) 803 MCG tablet, Take 400 mcg by mouth daily., Disp: , Rfl:  .  hydrochlorothiazide (HYDRODIURIL) 25 MG tablet, TAKE 1 TABLET BY MOUTH EVERY MORNING, Disp: 90 tablet, Rfl: 3 .  KRILL OIL PO, Take 500 mg by mouth., Disp: , Rfl:  .  Lecithin 1200 MG CAPS, Take 1,200 mg by mouth every morning. , Disp: , Rfl:  .  losartan (COZAAR) 50 MG  tablet, Take 1 tablet (50 mg total) by mouth 2 (two) times daily., Disp: 180 tablet, Rfl: 3 .  Magnesium 250 MG TABS, Take 1 tablet by mouth every morning. , Disp: , Rfl:  .  Multiple Vitamins-Minerals (CENTRUM SILVER PO), Take 1 tablet by mouth every morning. , Disp: , Rfl:  .  Multiple Vitamins-Minerals (OCUVITE EYE HEALTH FORMULA PO), Take 1 tablet by mouth daily at 12 noon. , Disp: , Rfl:  .  NON FORMULARY, Occuvite, Disp: , Rfl:  .  PROAIR HFA 108 (90 BASE) MCG/ACT inhaler, INHALE 2 PUFFS BY MOUTH EVERY 6 HOURS AS NEEDED FOR WHEEZING, Disp: 8.5 g, Rfl: 6 .  SPIRIVA HANDIHALER 18 MCG inhalation capsule, INHALE THE CONTENTS OF 1 CAPSULE VIA HANDIHALER BY MOUTH EVERY DAY AS DIRECTED, Disp: 30 capsule, Rfl: 3 .  vitamin B-12 (CYANOCOBALAMIN) 500 MCG tablet, Take 500 mcg by mouth every morning. , Disp: , Rfl:

## 2014-07-01 NOTE — Patient Instructions (Signed)
Stop Spiriva Use Stiolto 2 puffs daily and carefully monitor your BP, if your blood pressure goes up on this medication stop it and resume Spiriva We will call you with the results of the chest x-ray in lung function test We will see you back in 6 weeks or sooner if needed

## 2014-07-08 ENCOUNTER — Encounter: Payer: Self-pay | Admitting: Pulmonary Disease

## 2014-07-08 MED ORDER — TIOTROPIUM BROMIDE-OLODATEROL 2.5-2.5 MCG/ACT IN AERS: 2.0000 | INHALATION_SPRAY | RESPIRATORY_TRACT | Status: DC

## 2014-07-08 NOTE — Telephone Encounter (Signed)
Per 07/01/14 OV w/ BQ: Patient Instructions     Stop Spiriva Use Stiolto 2 puffs daily and carefully monitor your BP, if your blood pressure goes up on this medication stop it and resume Spiriva We will call you with the results of the chest x-ray in lung function test We will see you back in 6 weeks or sooner if needed  ---

## 2014-07-29 ENCOUNTER — Ambulatory Visit: Payer: Medicare Other | Admitting: Neurology

## 2014-08-18 ENCOUNTER — Encounter: Payer: Self-pay | Admitting: Family Medicine

## 2014-08-19 ENCOUNTER — Ambulatory Visit (INDEPENDENT_AMBULATORY_CARE_PROVIDER_SITE_OTHER): Payer: Medicare Other | Admitting: Pulmonary Disease

## 2014-08-19 ENCOUNTER — Encounter: Payer: Self-pay | Admitting: Pulmonary Disease

## 2014-08-19 VITALS — BP 126/74 | HR 67 | Ht 63.0 in | Wt 196.0 lb

## 2014-08-19 DIAGNOSIS — R0602 Shortness of breath: Secondary | ICD-10-CM

## 2014-08-19 DIAGNOSIS — J432 Centrilobular emphysema: Secondary | ICD-10-CM | POA: Diagnosis not present

## 2014-08-19 DIAGNOSIS — I2510 Atherosclerotic heart disease of native coronary artery without angina pectoris: Secondary | ICD-10-CM

## 2014-08-19 LAB — PULMONARY FUNCTION TEST
DL/VA % PRED: 73 %
DL/VA: 3.46 ml/min/mmHg/L
DLCO UNC: 15.96 ml/min/mmHg
DLCO unc % pred: 69 %
FEF 25-75 POST: 0.79 L/s
FEF 25-75 Pre: 0.47 L/sec
FEF2575-%Change-Post: 68 %
FEF2575-%PRED-POST: 52 %
FEF2575-%Pred-Pre: 31 %
FEV1-%Change-Post: 20 %
FEV1-%Pred-Post: 69 %
FEV1-%Pred-Pre: 57 %
FEV1-Post: 1.34 L
FEV1-Pre: 1.12 L
FEV1FVC-%Change-Post: 5 %
FEV1FVC-%PRED-PRE: 74 %
FEV6-%Change-Post: 14 %
FEV6-%Pred-Post: 92 %
FEV6-%Pred-Pre: 80 %
FEV6-Post: 2.27 L
FEV6-Pre: 1.98 L
FEV6FVC-%Change-Post: 0 %
FEV6FVC-%PRED-POST: 102 %
FEV6FVC-%PRED-PRE: 102 %
FVC-%CHANGE-POST: 14 %
FVC-%PRED-POST: 89 %
FVC-%PRED-PRE: 77 %
FVC-POST: 2.33 L
FVC-Pre: 2.04 L
PRE FEV1/FVC RATIO: 55 %
PRE FEV6/FVC RATIO: 97 %
Post FEV1/FVC ratio: 58 %
Post FEV6/FVC ratio: 98 %
RV % PRED: 108 %
RV: 2.47 L
TLC % pred: 99 %
TLC: 4.87 L

## 2014-08-19 NOTE — Progress Notes (Signed)
PFT done today. 

## 2014-08-19 NOTE — Progress Notes (Signed)
Subjective:    Patient ID: Melissa Ferrell, female    DOB: Apr 12, 1935, 79 y.o.   MRN: [id number]  Synopsis: COPD followed by Dr. Gwenette Greet prior to 06/2014; she has a history of rheumatoid arthritis. She previously smoked 3 ppd and quit in 1986.  PFT's 09/12/2011:  FEV1  1.37 (79%) ratio 47, FEV1 increased 14% with BD, +++ airtrapping, DLCO 60% On spiriva, arcapta added 11/2011 as a trial with ++response.  Pt wanted to take arcapta alone and stop spiriva  >> saw worsening breathing with arcapta alone, so restarted spiriva  Worsening on spiriva/arcapta, no coverage for stiolto; Breo did not help. Trial of breo 03/2014   August 2016 pulmonary function testing> ratio 58%, FEV1 1.34 L (69% predicted, 20% change with broncho-dilator), total lung capacity 4.87 L (99% predicted), DLCO 15.96 (69% predicted) sees.   HPI Chief Complaint  Patient presents with  . Follow-up    review PFT from today. pt states that Stiolto is working very well for her, only using rescue inhaler 1X/daily.     Melissa Ferrell says that she has been doing just "beautiful".  She has been working out at the [employer] and has lost 10 pounds through diet and exercise. She really likes that [employer] and has really worked well and she has only had to use her albuterol inhaler a little.  She says that her "eye's are tired" since starting the Stiolto, but no visual acuity issues.  She has some dry eyes from time to time.    Past Medical History  Diagnosis Date  . HTN (hypertension)   . Hyperlipidemia     intol to statins  . IBS (irritable bowel syndrome)   . Anxiety and depression   . Carotid artery disease     hx CAD, dCHF listed in [place] - sees cardiologist, vasc and neurologist  . COPD (chronic obstructive pulmonary disease)     sees pulmonology  . Osteoporosis   . GERD (gastroesophageal reflux disease)     hx PUD 2ndary to NSAIDs 1975  . Hx of adenomatous colonic polyps   . Hemorrhoids   . Migraine headache   . Rheumatoid  arthritis(714.0)     per her report, in remission for many years per her report  . DDD (degenerative disc disease)   . Fracture, spinal   . Disseminated superficial actinic porokeratosis     bilateral calves  . Varicose veins     hx LE edema  . Memory loss     seen by neurology in 2015 and referred to neuropsych for anxiety  . RHEUMATIC FEVER, HX OF 10/15/2006    Qualifier: Diagnosis of  By: Jenny Reichmann MD, Hunt Oris       Review of Systems  Constitutional: Negative for fever, diaphoresis and fatigue.  HENT: Negative for rhinorrhea, sinus pressure and sneezing.   Respiratory: Negative for cough, shortness of breath and wheezing.   Cardiovascular: Negative for chest pain, palpitations and leg swelling.       Objective:   Physical Exam Filed Vitals:   08/19/14 1409  BP: 126/74  Pulse: 67  Height: '5\' 3"'$  (1.6 m)  Weight: 196 lb (88.905 kg)  SpO2: 95%   RA  Gen: well appearing HENT: OP clear,  neck supple PULM: CTA B, normal percussion CV: RRR, no mgr, trace edema GI: BS+, soft, nontender Derm: no cyanosis or rash Psyche: normal mood and affect      Assessment & Plan:  COPD (chronic obstructive pulmonary disease) with emphysema She  has done remarkably well with the addition of Stiolto and her shortness of breath has improved. She has not required a rescue inhaler. Her lung function testing today did not show dramatic worsening of her COPD compared to the last study in 2013. In fact on all measures things were about the same.  Plan: Continue Stiolto 2 puffs daily no matter what Get a flu shot in the fall Follow-up 4 months or sooner if needed     Current outpatient prescriptions:  .  ALPRAZolam (XANAX) 0.25 MG tablet, TAKE ONE HALF TO 1 TABLET BY MOUTH AS NEEDED, Disp: 30 tablet, Rfl: 4 .  ARTIFICIAL TEAR OP, Apply to eye., Disp: , Rfl:  .  aspirin 81 MG tablet, Take 81 mg by mouth 2 (two) times daily. , Disp: , Rfl:  .  Calcium Carbonate-Vitamin D 600-400 MG-UNIT per  tablet, Take 1 tablet by mouth 2 (two) times daily. , Disp: , Rfl:  .  carvedilol (COREG) 12.5 MG tablet, Take 1 tablet (12.5 mg total) by mouth 2 (two) times daily with a meal., Disp: 270 tablet, Rfl: 3 .  carvedilol (COREG) 6.25 MG tablet, Take 1 tablet (6.25 mg total) by mouth 2 (two) times daily., Disp: 180 tablet, Rfl: 3 .  Flaxseed, Linseed, (FLAXSEED OIL) 1000 MG CAPS, Take 1 capsule by mouth daily at 12 noon. , Disp: , Rfl:  .  folic acid (FOLVITE) 410 MCG tablet, Take 400 mcg by mouth daily., Disp: , Rfl:  .  hydrochlorothiazide (HYDRODIURIL) 25 MG tablet, TAKE 1 TABLET BY MOUTH EVERY MORNING, Disp: 90 tablet, Rfl: 3 .  KRILL OIL PO, Take 500 mg by mouth., Disp: , Rfl:  .  Lecithin 1200 MG CAPS, Take 1,200 mg by mouth every morning. , Disp: , Rfl:  .  losartan (COZAAR) 50 MG tablet, Take 1 tablet (50 mg total) by mouth 2 (two) times daily., Disp: 180 tablet, Rfl: 3 .  Magnesium 250 MG TABS, Take 1 tablet by mouth every morning. , Disp: , Rfl:  .  Multiple Vitamins-Minerals (CENTRUM SILVER PO), Take 1 tablet by mouth every morning. , Disp: , Rfl:  .  Multiple Vitamins-Minerals (OCUVITE EYE HEALTH FORMULA PO), Take 1 tablet by mouth daily at 12 noon. , Disp: , Rfl:  .  NON FORMULARY, Occuvite, Disp: , Rfl:  .  PROAIR HFA 108 (90 BASE) MCG/ACT inhaler, INHALE 2 PUFFS BY MOUTH EVERY 6 HOURS AS NEEDED FOR WHEEZING, Disp: 8.5 g, Rfl: 6 .  Tiotropium Bromide-Olodaterol (STIOLTO RESPIMAT) 2.5-2.5 MCG/ACT AERS, Inhale 2 puffs into the lungs every morning., Disp: 4 g, Rfl: 3 .  vitamin B-12 (CYANOCOBALAMIN) 500 MCG tablet, Take 500 mcg by mouth every morning. , Disp: , Rfl:

## 2014-08-19 NOTE — Patient Instructions (Signed)
Keep taking your medications as you are doing Get a flu shot in the fall We will see you back in 4 months or sooner if needed

## 2014-08-19 NOTE — Assessment & Plan Note (Signed)
She has done remarkably well with the addition of Stiolto and her shortness of breath has improved. She has not required a rescue inhaler. Her lung function testing today did not show dramatic worsening of her COPD compared to the last study in 2013. In fact on all measures things were about the same.  Plan: Continue Stiolto 2 puffs daily no matter what Get a flu shot in the fall Follow-up 4 months or sooner if needed

## 2014-08-29 ENCOUNTER — Ambulatory Visit (INDEPENDENT_AMBULATORY_CARE_PROVIDER_SITE_OTHER): Payer: Medicare Other | Admitting: Family Medicine

## 2014-08-29 ENCOUNTER — Encounter: Payer: Self-pay | Admitting: Family Medicine

## 2014-08-29 ENCOUNTER — Telehealth: Payer: Self-pay

## 2014-08-29 VITALS — BP 160/90 | HR 62 | Temp 98.2°F | Wt 197.0 lb

## 2014-08-29 DIAGNOSIS — I10 Essential (primary) hypertension: Secondary | ICD-10-CM | POA: Diagnosis not present

## 2014-08-29 DIAGNOSIS — Z23 Encounter for immunization: Secondary | ICD-10-CM | POA: Diagnosis not present

## 2014-08-29 DIAGNOSIS — K219 Gastro-esophageal reflux disease without esophagitis: Secondary | ICD-10-CM

## 2014-08-29 DIAGNOSIS — E669 Obesity, unspecified: Secondary | ICD-10-CM | POA: Diagnosis not present

## 2014-08-29 DIAGNOSIS — I679 Cerebrovascular disease, unspecified: Secondary | ICD-10-CM

## 2014-08-29 DIAGNOSIS — F39 Unspecified mood [affective] disorder: Secondary | ICD-10-CM | POA: Diagnosis not present

## 2014-08-29 DIAGNOSIS — I2510 Atherosclerotic heart disease of native coronary artery without angina pectoris: Secondary | ICD-10-CM

## 2014-08-29 DIAGNOSIS — E785 Hyperlipidemia, unspecified: Secondary | ICD-10-CM

## 2014-08-29 DIAGNOSIS — J432 Centrilobular emphysema: Secondary | ICD-10-CM

## 2014-08-29 DIAGNOSIS — I739 Peripheral vascular disease, unspecified: Secondary | ICD-10-CM

## 2014-08-29 LAB — BASIC METABOLIC PANEL
BUN: 19 mg/dL (ref 6–23)
CHLORIDE: 100 meq/L (ref 96–112)
CO2: 30 mEq/L (ref 19–32)
CREATININE: 1.06 mg/dL (ref 0.40–1.20)
Calcium: 9.7 mg/dL (ref 8.4–10.5)
GFR: 53.41 mL/min — AB (ref >60.00)
Glucose, Bld: 104 mg/dL — ABNORMAL HIGH (ref 70–99)
Potassium: 3.8 mEq/L (ref 3.5–5.1)
SODIUM: 137 meq/L (ref 135–145)

## 2014-08-29 LAB — LIPID PANEL
Cholesterol: 226 mg/dL — ABNORMAL HIGH (ref 0–200)
HDL: 48.7 mg/dL (ref >39.00)
LDL Cholesterol: 153 mg/dL — ABNORMAL HIGH (ref 0–99)
NONHDL: 177.11
TRIGLYCERIDES: 122 mg/dL (ref 0.0–149.0)
Total CHOL/HDL Ratio: 5
VLDL: 24.4 mg/dL (ref 0.0–40.0)

## 2014-08-29 MED ORDER — ALPRAZOLAM 0.25 MG PO TABS: ORAL_TABLET | ORAL | Status: DC

## 2014-08-29 NOTE — Patient Instructions (Signed)
BEFORE YOU LEAVE: -labs -Please recheck her blood pressure and if still elevated set up nurse visit in 1 week to recheck after taking medications -follow up in 3 months  Please consider cognitive behavioral therapy for your anxiety. Please use as little of the xanax as possible.  We recommend the following healthy lifestyle measures: - eat a healthy diet consisting of lots of vegetables, fruits, beans, nuts, seeds, healthy meats such as white chicken and fish and whole grains.  - avoid fried foods, fast food, processed foods, sodas, red meet and other fattening foods.  - get a least 150 minutes of aerobic exercise per week.

## 2014-08-29 NOTE — Progress Notes (Signed)
HPI:  GAD/Hx of Major Depression: -hx of hospitalization in the past after divorce from husband remotely -hx of generalized anxiety and panic attack in the past -mild depressed mood intermittently since, takes 1/2 of xanax about 20 times per month for anxiety -denies: SI, thoughts of self harm - she has been hospitalized for her anxiety   HDL/Obesity: -she reports does not tolerate any medications for this -takes krill oil -reports couldn't move legs with statins -trying to work on exercise- wi-fit; working on diet -LDL not at goal, but she refuses medication for this - she has done the [employer] and she is exercising and eating well and has lost 10 lbs  HTN/Carotid art dz/PAD/LE edema: -sees cardiologist and vascular doctor for this -home bloods pressure 1 teens-120s/60-70s  - she did not take her medication -meds: asa '81mg'$ , coreg 12.5 (she take 1.5 tablets), hctz '25mg'$ , losartan '50mg'$  -Denies: CP, palpations, swelling -wears compression socks  COPD w emphysema: -sees pulmonologist for this, recent PFT stable -meds: spiriva, proair, stiolta  Memory Loss: -seeing neurologist and per last notes advising to undergo neuropsychological testing as felt was related to anxiety  Osteoporosis: -reports can't take ANY medications for this - does not tolerate -on calcium and Vit D -she is cautious and uses cane   ROS negative for unless reported above: fevers, unintentional weight loss, hearing or vision loss, chest pain, palpitations, struggling to breath, hemoptysis, melena, hematochezia, hematuria, falls, loc, si, thoughts of self harm  ROS: See pertinent positives and negatives per HPI.  Past Medical History  Diagnosis Date  . HTN (hypertension)   . Hyperlipidemia     intol to statins  . IBS (irritable bowel syndrome)   . Anxiety and depression   . Carotid artery disease     hx CAD, dCHF listed in [place] - sees cardiologist, vasc and neurologist  . COPD (chronic obstructive  pulmonary disease)     sees pulmonology  . Osteoporosis   . GERD (gastroesophageal reflux disease)     hx PUD 2ndary to NSAIDs 1975  . Hx of adenomatous colonic polyps   . Hemorrhoids   . Migraine headache   . Rheumatoid arthritis(714.0)     per her report, in remission for many years per her report  . DDD (degenerative disc disease)   . Fracture, spinal   . Disseminated superficial actinic porokeratosis     bilateral calves  . Varicose veins     hx LE edema  . Memory loss     seen by neurology in 2015 and referred to neuropsych for anxiety  . RHEUMATIC FEVER, HX OF 10/15/2006    Qualifier: Diagnosis of  By: Jenny Reichmann MD, Hunt Oris     Past Surgical History  Procedure Laterality Date  . Thyroglossal duct cyst      1943, 1944, 1973  . Chronically infected lymph node removed  1973    Family History  Problem Relation Age of Onset  . Heart disease Mother     MI  . Hypertension Mother   . Heart attack Mother   . Stomach cancer Brother   . Cancer Brother   . Heart disease Brother   . Hypertension Brother   . Heart attack Brother   . AAA (abdominal aortic aneurysm) Brother   . Aneurysm Brother     brain  . Melanoma Brother   . Colon cancer Brother   . Heart disease Brother   . Alcohol abuse    . Heart attack Brother 41  .  Allergies Sister   . Hypertension Sister   . Breast cancer Maternal Grandmother   . Brain cancer Father   . Cancer Father     Social History   Social History  . Marital Status: Divorced    Spouse Name: N/A  . Number of Children: 3  . Years of Education: N/A   Occupational History  . Retired-homemaker    Social History Main Topics  . Smoking status: Former Smoker -- 3.00 packs/day for 30 years    Types: Cigarettes    Quit date: 01/17/1985  . Smokeless tobacco: Never Used     Comment: previous 80 pack year history  . Alcohol Use: No  . Drug Use: No  . Sexual Activity: No   Other Topics Concern  . None   Social History Narrative   Work  or School: volunteers at [employer] Situation: lives at [place]:       Lifestyle: no regular exercise; diet is ok           Current outpatient prescriptions:  .  ALPRAZolam (XANAX) 0.25 MG tablet, TAKE ONE HALF TO 1 TABLET BY MOUTH AS NEEDED, Disp: 30 tablet, Rfl: 1 .  ARTIFICIAL TEAR OP, Apply to eye., Disp: , Rfl:  .  aspirin 81 MG tablet, Take 81 mg by mouth 2 (two) times daily. , Disp: , Rfl:  .  Calcium Carbonate-Vitamin D 600-400 MG-UNIT per tablet, Take 1 tablet by mouth 2 (two) times daily. , Disp: , Rfl:  .  carvedilol (COREG) 12.5 MG tablet, Take 1 tablet (12.5 mg total) by mouth 2 (two) times daily with a meal., Disp: 270 tablet, Rfl: 3 .  carvedilol (COREG) 6.25 MG tablet, Take 1 tablet (6.25 mg total) by mouth 2 (two) times daily., Disp: 180 tablet, Rfl: 3 .  Flaxseed, Linseed, (FLAXSEED OIL) 1000 MG CAPS, Take 1 capsule by mouth daily at 12 noon. , Disp: , Rfl:  .  folic acid (FOLVITE) 102 MCG tablet, Take 400 mcg by mouth daily., Disp: , Rfl:  .  hydrochlorothiazide (HYDRODIURIL) 25 MG tablet, TAKE 1 TABLET BY MOUTH EVERY MORNING, Disp: 90 tablet, Rfl: 3 .  KRILL OIL PO, Take 500 mg by mouth., Disp: , Rfl:  .  Lecithin 1200 MG CAPS, Take 1,200 mg by mouth every morning. , Disp: , Rfl:  .  losartan (COZAAR) 50 MG tablet, Take 1 tablet (50 mg total) by mouth 2 (two) times daily., Disp: 180 tablet, Rfl: 3 .  Magnesium 250 MG TABS, Take 1 tablet by mouth every morning. , Disp: , Rfl:  .  Multiple Vitamins-Minerals (CENTRUM SILVER PO), Take 1 tablet by mouth every morning. , Disp: , Rfl:  .  Multiple Vitamins-Minerals (OCUVITE EYE HEALTH FORMULA PO), Take 1 tablet by mouth daily at 12 noon. , Disp: , Rfl:  .  NON FORMULARY, Occuvite, Disp: , Rfl:  .  PROAIR HFA 108 (90 BASE) MCG/ACT inhaler, INHALE 2 PUFFS BY MOUTH EVERY 6 HOURS AS NEEDED FOR WHEEZING, Disp: 8.5 g, Rfl: 6 .  Tiotropium Bromide-Olodaterol (STIOLTO RESPIMAT) 2.5-2.5 MCG/ACT  AERS, Inhale 2 puffs into the lungs every morning., Disp: 4 g, Rfl: 3 .  vitamin B-12 (CYANOCOBALAMIN) 500 MCG tablet, Take 500 mcg by mouth every morning. , Disp: , Rfl:   EXAM:  Filed Vitals:   08/29/14 0829  BP: 160/90  Pulse: 62  Temp: 98.2 F (36.8 C)    Body mass  index is 34.91 kg/(m^2).  GENERAL: vitals reviewed and listed above, alert, oriented, appears well hydrated and in no acute distress  HEENT: atraumatic, conjunttiva clear, no obvious abnormalities on inspection of external nose and ears  NECK: no obvious masses on inspection  LUNGS: clear to auscultation bilaterally, no wheezes, rales or rhonchi, good air movement  CV: HRRR, no peripheral edema  MS: moves all extremities without noticeable abnormality  PSYCH: pleasant and cooperative, no obvious depression or anxiety  ASSESSMENT AND PLAN:  Discussed the following assessment and plan:  Obesity Hyperlipemia -congratulated on exercise -she refuses a statin but wants to check her cholesterol after doing the prep program  Depression and anxiety -advised CBT and SSRI if using benzo on a regular basis - she refused and prefers to use xanax and feels this is what works for her -discuss risks with benzos, she is using a low dose, not on a daily basis and has decided to continue this  Essential hypertension -elevated here, but she reports she did not take medications this morning as is fastin and home log BPs good  Cerebrovascular disease PVD (peripheral vascular disease) -sees specialists for this  Gastroesophageal reflux disease, esophagitis presence not specified  -Patient advised to return or notify a doctor immediately if symptoms worsen or persist or new concerns arise.  There are no Patient Instructions on file for this visit.   Colin Benton R.

## 2014-08-29 NOTE — Progress Notes (Signed)
Pre visit review using our clinic review tool, if applicable. No additional management support is needed unless otherwise documented below in the visit note. 

## 2014-08-29 NOTE — Telephone Encounter (Signed)
Called and left message informing pt that her form is ready for pick up or we can mail it.

## 2014-09-02 ENCOUNTER — Encounter: Payer: Self-pay | Admitting: Cardiology

## 2014-09-02 NOTE — Telephone Encounter (Signed)
Please let pt know: I did not add this to her chart. It was added by Dr. Jenny Reichmann in the past. Removed from active problem list since she reports no symptoms now, however, I can not remove from the [facility] given it is part of her PMH.

## 2014-09-02 NOTE — Telephone Encounter (Signed)
I called the pt to discuss her lab test results and she stated she noticed Dr Maudie Mercury added the diagnosis of GERD to her chart and she would like to have her take this off as she has not had this in 8 years and she told Dr Maudie Mercury that during her office visit.

## 2014-09-03 ENCOUNTER — Encounter: Payer: Self-pay | Admitting: Family Medicine

## 2014-10-07 ENCOUNTER — Ambulatory Visit: Payer: Medicare Other | Admitting: Pulmonary Disease

## 2014-10-24 NOTE — Progress Notes (Signed)
HPI: FU congestive heart failure and hypertension. Myoview in May of 2011. Perfusion was normal. The study was not gated. Echocardiogram in August 2013 showed normal LV function. We felt that her dyspnea was most likely secondary to COPD. Carotid Dopplers in Sept 2015 showed no hemodynamically significant stenosis. Abdominal ultrasound April 2016 showed no aneurysm. Since I last saw her, she does have some dyspnea on exertion but improved. No orthopnea or PND. Occasional brief chest pain not related to exertion.  Current Outpatient Prescriptions  Medication Sig Dispense Refill  . ALPRAZolam (XANAX) 0.25 MG tablet TAKE ONE HALF TO 1 TABLET BY MOUTH AS NEEDED 30 tablet 1  . ARTIFICIAL TEAR OP Apply to eye.    Marland Kitchen aspirin 81 MG tablet Take 81 mg by mouth 2 (two) times daily.     . Calcium Carbonate-Vitamin D 600-400 MG-UNIT per tablet Take 1 tablet by mouth 2 (two) times daily.     . carvedilol (COREG) 12.5 MG tablet Take 1 tablet (12.5 mg total) by mouth 2 (two) times daily with a meal. (Patient taking differently: Take 12.5 mg by mouth 2 (two) times daily with a meal. Take one and one-half tablet by mouth twice daily with food.) 270 tablet 3  . Flaxseed, Linseed, (FLAXSEED OIL) 1000 MG CAPS Take 1 capsule by mouth daily at 12 noon.     . folic acid (FOLVITE) 856 MCG tablet Take 400 mcg by mouth daily.    . hydrochlorothiazide (HYDRODIURIL) 25 MG tablet TAKE 1 TABLET BY MOUTH EVERY MORNING 90 tablet 3  . KRILL OIL PO Take 500 mg by mouth.    . Lecithin 1200 MG CAPS Take 1,200 mg by mouth every morning.     Marland Kitchen losartan (COZAAR) 50 MG tablet Take 1 tablet (50 mg total) by mouth 2 (two) times daily. 180 tablet 3  . Magnesium 250 MG TABS Take 1 tablet by mouth every morning.     . Multiple Vitamins-Minerals (CENTRUM SILVER PO) Take 1 tablet by mouth every morning.     . Multiple Vitamins-Minerals (OCUVITE EYE HEALTH FORMULA PO) Take 1 tablet by mouth daily at 12 noon.     . NON FORMULARY Occuvite     . PROAIR HFA 108 (90 BASE) MCG/ACT inhaler INHALE 2 PUFFS BY MOUTH EVERY 6 HOURS AS NEEDED FOR WHEEZING 8.5 g 6  . Tiotropium Bromide-Olodaterol (STIOLTO RESPIMAT) 2.5-2.5 MCG/ACT AERS Inhale 2 puffs into the lungs every morning. 4 g 3  . vitamin B-12 (CYANOCOBALAMIN) 500 MCG tablet Take 500 mcg by mouth every morning.      No current facility-administered medications for this visit.     Past Medical History  Diagnosis Date  . HTN (hypertension)   . Hyperlipidemia     intol to statins  . IBS (irritable bowel syndrome)   . Anxiety and depression   . Carotid artery disease (HCC)     hx CAD, dCHF listed in [place] - sees cardiologist, vasc and neurologist  . COPD (chronic obstructive pulmonary disease) ([place])     sees pulmonology  . Osteoporosis   . GERD (gastroesophageal reflux disease)     hx PUD 2ndary to NSAIDs 1975  . Hx of adenomatous colonic polyps   . Hemorrhoids   . Migraine headache   . Rheumatoid arthritis(714.0)     per her report, in remission for many years per her report  . DDD (degenerative disc disease)   . Fracture, spinal   . Disseminated superficial actinic porokeratosis  bilateral calves  . Varicose veins     hx LE edema  . Memory loss     seen by neurology in 2015 and referred to neuropsych for anxiety  . RHEUMATIC FEVER, HX OF 10/15/2006    Qualifier: Diagnosis of  By: Jenny Reichmann MD, Hunt Oris     Past Surgical History  Procedure Laterality Date  . Thyroglossal duct cyst      1943, 1944, 1973  . Chronically infected lymph node removed  1973    Social History   Social History  . Marital Status: Divorced    Spouse Name: N/A  . Number of Children: 3  . Years of Education: N/A   Occupational History  . Retired-homemaker    Social History Main Topics  . Smoking status: Former Smoker -- 3.00 packs/day for 30 years    Types: Cigarettes    Quit date: 01/17/1985  . Smokeless tobacco: Never Used     Comment: previous 80 pack year history  . Alcohol  Use: No  . Drug Use: No  . Sexual Activity: No   Other Topics Concern  . Not on file   Social History Narrative   Work or School: volunteers at [employer] Situation: lives at [place]:       Lifestyle: no regular exercise; diet is ok          ROS: no fevers or chills, productive cough, hemoptysis, dysphasia, odynophagia, melena, hematochezia, dysuria, hematuria, rash, seizure activity, orthopnea, PND, pedal edema, claudication. Remaining systems are negative.  Physical Exam: Well-developed well-nourished in no acute distress.  Skin is warm and dry.  HEENT is normal.  Neck is supple.  Chest is clear to auscultation with normal expansion.  Cardiovascular exam is regular rate and rhythm.  Abdominal exam nontender or distended. No masses palpated. Extremities show no edema. neuro grossly intact  ECG Sinus rhythm at a rate of 60. Normal axis. Cannot rule out prior septal infarct.

## 2014-10-28 ENCOUNTER — Encounter: Payer: Self-pay | Admitting: Cardiology

## 2014-10-28 ENCOUNTER — Ambulatory Visit (INDEPENDENT_AMBULATORY_CARE_PROVIDER_SITE_OTHER): Payer: Medicare Other | Admitting: Cardiology

## 2014-10-28 VITALS — BP 156/80 | HR 60 | Ht 64.0 in | Wt 192.0 lb

## 2014-10-28 DIAGNOSIS — E785 Hyperlipidemia, unspecified: Secondary | ICD-10-CM | POA: Diagnosis not present

## 2014-10-28 DIAGNOSIS — I679 Cerebrovascular disease, unspecified: Secondary | ICD-10-CM | POA: Diagnosis not present

## 2014-10-28 DIAGNOSIS — I2510 Atherosclerotic heart disease of native coronary artery without angina pectoris: Secondary | ICD-10-CM

## 2014-10-28 DIAGNOSIS — I10 Essential (primary) hypertension: Secondary | ICD-10-CM | POA: Diagnosis not present

## 2014-10-28 NOTE — Patient Instructions (Signed)
Your physician wants you to follow-up in: ONE YEAR WITH DR CRENSHAW You will receive a reminder letter in the mail two months in advance. If you don't receive a letter, please call our office to schedule the follow-up appointment.  

## 2014-10-28 NOTE — Assessment & Plan Note (Signed)
Blood pressure is mildly elevated today. However she follows this daily at home and it is typically controlled. She apparently has brought her blood pressure cuff in previously and it correlated with ours. Continue present medications and follow.

## 2014-10-28 NOTE — Assessment & Plan Note (Signed)
Mild on most recent carotid Dopplers. 

## 2014-10-28 NOTE — Assessment & Plan Note (Signed)
Continue diet. Intolerant to statins. 

## 2014-10-29 ENCOUNTER — Encounter: Payer: Self-pay | Admitting: Pulmonary Disease

## 2014-10-29 NOTE — Telephone Encounter (Signed)
BQ - please advise. Thanks. 

## 2014-10-30 ENCOUNTER — Encounter: Payer: Self-pay | Admitting: Pulmonary Disease

## 2014-10-30 NOTE — Telephone Encounter (Signed)
I am having difficulty with Stiolta use over the past week. When Mist hits back of throat it causes me to cough which stops the inhaling process for both inhalations which seems to be reducing amount of mist available to lungs. Nothing I have tried so far helps my ability to breathe in the mist fully. I have also had to use pro air 5 times in last 2 weeks, when I had only used it 4times since starting Stiolta June 15.  Is this okay? Do you have any suggestions?  If I am to continue [place], I need refills for the Rx. I also need an Rx for ProAir since my Rx from Dr. Ledora Bottcher has expired. My pharmacy is [facility] at [phone])  Just so you know, I saw Dr. Stanford Breed yesterday for my regular appt. while my BP was somewhat elevated, he was pleased with overall stats and did not change meds. My EKG was good, too.  Thank you. Melissa Ferrell (2035/02/07). [phone] ----  Please advise Dr. Lake Bells thanks

## 2014-10-31 ENCOUNTER — Telehealth: Payer: Self-pay | Admitting: Pulmonary Disease

## 2014-10-31 NOTE — Telephone Encounter (Signed)
Called spoke with pt. She has already stopped the stiolto and went back to spiriva only.  She wanted to schedule an appt to be seen Monday in case she is not better by then. So far she feels fine. appt scheduled for Monday. Nothing further needed

## 2014-10-31 NOTE — Telephone Encounter (Signed)
Hi Melissa Ferrell, I do not have a spacer nor even know what it is nor how to use it, so would need to be shown. I can come to office today. Because of the significant inflammation in my throat area, I am not inclined to use Stiolta in any way until that subsides. Did you get the 2nd email sent 10/13 saying I think this is a contact allergy and that I was using Pro Air only yesterday (3 times). Would using Spiriva (which I have) be appropriate until throat irritation (which seems to be down to my collarbone area inside) subsides? Please let me know how to proceed. Thanks. Melissa Ferrell ([id number]). [phone]  ---------------------- BQ please advise, pt had more questions. Thanks.

## 2014-11-03 ENCOUNTER — Ambulatory Visit (INDEPENDENT_AMBULATORY_CARE_PROVIDER_SITE_OTHER): Payer: Medicare Other | Admitting: Adult Health

## 2014-11-03 ENCOUNTER — Encounter: Payer: Self-pay | Admitting: Adult Health

## 2014-11-03 VITALS — BP 138/90 | HR 78 | Temp 98.0°F | Ht 64.0 in | Wt 195.0 lb

## 2014-11-03 DIAGNOSIS — J439 Emphysema, unspecified: Secondary | ICD-10-CM | POA: Diagnosis not present

## 2014-11-03 DIAGNOSIS — I2510 Atherosclerotic heart disease of native coronary artery without angina pectoris: Secondary | ICD-10-CM | POA: Diagnosis not present

## 2014-11-03 MED ORDER — BECLOMETHASONE DIPROPIONATE 80 MCG/ACT IN AERS: 2.0000 | INHALATION_SPRAY | Freq: Two times a day (BID) | RESPIRATORY_TRACT | Status: DC

## 2014-11-03 MED ORDER — BECLOMETHASONE DIPROPIONATE 80 MCG/ACT IN AERS: 2.0000 | INHALATION_SPRAY | Freq: Two times a day (BID) | RESPIRATORY_TRACT | Status: AC

## 2014-11-03 MED ORDER — AEROCHAMBER PLUS FLO-VU LARGE MISC: Status: DC

## 2014-11-03 NOTE — Assessment & Plan Note (Signed)
Intolerance/reaction to [employer] , multiple drug intolerance  Will try ICS w/ spacer  Pt edcuation given   Plan  Stop Stiolto -will place on allergy list.  Begin QVAR 2 puffs Twice daily  With spacer, rinse after use.  follow up Dr. Lake Bells in 6 weeks and As needed   Please contact office for sooner follow up if symptoms do not improve or worsen or seek emergency care

## 2014-11-03 NOTE — Patient Instructions (Signed)
Stop Stiolto -will place on allergy list.  Begin QVAR 2 puffs Twice daily  With spacer, rinse after use.  follow up Dr. Lake Bells in 6 weeks and As needed   Please contact office for sooner follow up if symptoms do not improve or worsen or seek emergency care

## 2014-11-03 NOTE — Progress Notes (Signed)
   Subjective:    Patient ID: Melissa Ferrell, female    DOB: 01-23-1935, 79 y.o.   MRN: [id number]  HPI  79 yo female with  COPD previously followed by Melissa Ferrell now established with Melissa Ferrell. \ She previously smoked 3 ppd and quit in 1986.  TEST  PFT's 09/12/2011: FEV1 1.37 (79%) ratio 47, FEV1 increased 14% with BD, +++ airtrapping, DLCO 60% August 2016 pulmonary function testing> ratio 58%, FEV1 1.34 L (69% predicted, 20% change with broncho-dilator), total lung capacity 4.87 L (99% predicted), DLCO 15.96 (69% predicted) sees.   11/03/2014 Acute OV :COPD  Pt presents for an acute office visit.  Has been on Stiolto for few months with good response and recently took this with suspected adverse reaction. After using inhaler started to have cough , sore throat with throat tightness. She stopped using this and used only her ProAir. She denies any new medications. She denies tongue/lip or eye swelling. No rash . Symptoms are improved with minimal throat issues other than hoarseness. No dysphagia.  Has multiple drug allergies/intolerances >18 on list. Has tried several inhalers in past with intolerances. Recent PFT in August showed moderate COPD with +BD response. We discussed options for COPD/asthma .  Review of Systems Constitutional:   No  weight loss, night sweats,  Fevers, chills, fatigue, or  lassitude.  HEENT:   No headaches,  Difficulty swallowing,  Tooth/dental problems, or  Sore throat,                No sneezing, itching, ear ache, nasal congestion, post nasal drip,   CV:  No chest pain,  Orthopnea, PND, swelling in lower extremities, anasarca, dizziness, palpitations, syncope.   GI  No heartburn, indigestion, abdominal pain, nausea, vomiting, diarrhea, change in bowel habits, loss of appetite, bloody stools.   Resp: .  No chest wall deformity  Skin: no rash or lesions.  GU: no dysuria, change in color of urine, no urgency or frequency.  No flank pain, no hematuria    MS:  No joint pain or swelling.  No decreased range of motion.  No back pain.  Psych:  No change in mood or affect. No depression or anxiety.  No memory loss.         Objective:   Physical Exam GEN: A/Ox3; pleasant , NAD, well nourished   HEENT:  /AT,  EACs-clear, TMs-wnl, NOSE-clear, THROAT-clear, no lesions, no postnasal drip or exudate noted.   NECK:  Supple w/ fair ROM; no JVD; normal carotid impulses w/o bruits; no thyromegaly or nodules palpated; no lymphadenopathy. No stridor   RESP  Clear  P & A; w/o, wheezes/ rales/ or rhonchi.no accessory muscle use, no dullness to percussion  CARD:  RRR, no m/r/g  , no peripheral edema, pulses intact, no cyanosis or clubbing.  GI:   Soft & nt; nml bowel sounds; no organomegaly or masses detected.  Musco: Warm bil, no deformities or joint swelling noted.   Neuro: alert, no focal deficits noted.    Skin: Warm, no lesions or rashes         Assessment & Plan:

## 2014-11-04 ENCOUNTER — Ambulatory Visit: Payer: Medicare Other | Admitting: Adult Health

## 2014-11-07 NOTE — Progress Notes (Signed)
Reviewed and discussed with Rexene Edison, I agree with this plan of care

## 2014-11-10 ENCOUNTER — Encounter: Payer: Self-pay | Admitting: Pulmonary Disease

## 2014-11-10 NOTE — Telephone Encounter (Signed)
Pt sent in pt email today stating the below: I was seen 10/17 by Rexene Edison who put me on Qvar 80 mcg two puffs twice daily which I started 10/18. It seems to have reduced wheezing but I have had to use ProAir once every day at varying times. My concerns are that I have developed tightness in my chest and pain midway between my shoulder blades about 30 minutes after using. I do not always feel stable and my legs feel somewhat unsteady. When I wake in the am I do not feel these after 12 hours without medication. Given my difficulty with tolerance for medications, I wondered if reduced usage might help. If so, how much?   My pharmacist told me that Medicare does not cover this inhaler which he let you know. If you determine that I may continue usage, will you appeal to Medicare? You provided me with a 120 dose sample for which I am grateful. I don't believe I can manage an additional large monthly fee for this inhaler and I don't qualify for special assistance.   Thank you.  ---  Please advise TP thanks  Allergies  Allergen Reactions  . Anoro Ellipta [Umeclidinium-Vilanterol]     HEADACHES  . Azithromycin   . Bee Venom   . Benzocaine   . Breo Ellipta [Fluticasone Furoate-Vilanterol]     Headache, anxiety.   Thayer Jew Hcl]   . Cefdinir   . Ciprofloxacin   . Diphenhydramine Hcl   . Erythromycin   . Indomethacin   . Lasix [Furosemide]     Low tolerance    . Other     Adhesive tape  . Paba Derivatives   . Penicillins   . Prednisone     Low tolerance   . Statins     Low tolerance    . Stiolto Respimat [Tiotropium Bromide-Olodaterol] Cough  . Tobramycin   . Zetia [Ezetimibe]     Low tolerance      Current Outpatient Prescriptions on File Prior to Visit  Medication Sig Dispense Refill  . ALPRAZolam (XANAX) 0.25 MG tablet TAKE ONE HALF TO 1 TABLET BY MOUTH AS NEEDED 30 tablet 1  . ARTIFICIAL TEAR OP Apply to eye.    Marland Kitchen aspirin 81 MG tablet Take 81 mg by mouth  2 (two) times daily.     . beclomethasone (QVAR) 80 MCG/ACT inhaler Inhale 2 puffs into the lungs 2 (two) times daily. 1 Inhaler 5  . Calcium Carbonate-Vitamin D 600-400 MG-UNIT per tablet Take 1 tablet by mouth 2 (two) times daily.     . carvedilol (COREG) 12.5 MG tablet Take 1 tablet (12.5 mg total) by mouth 2 (two) times daily with a meal. (Patient taking differently: Take 12.5 mg by mouth 2 (two) times daily with a meal. Take one and one-half tablet by mouth twice daily with food.) 270 tablet 3  . Flaxseed, Linseed, (FLAXSEED OIL) 1000 MG CAPS Take 1 capsule by mouth daily at 12 noon.     . folic acid (FOLVITE) 673 MCG tablet Take 400 mcg by mouth daily.    . hydrochlorothiazide (HYDRODIURIL) 25 MG tablet TAKE 1 TABLET BY MOUTH EVERY MORNING 90 tablet 3  . KRILL OIL PO Take 500 mg by mouth.    . Lecithin 1200 MG CAPS Take 1,200 mg by mouth every morning.     Marland Kitchen losartan (COZAAR) 50 MG tablet Take 1 tablet (50 mg total) by mouth 2 (two) times daily. 180 tablet 3  .  Magnesium 250 MG TABS Take 1 tablet by mouth every morning.     . Multiple Vitamins-Minerals (CENTRUM SILVER PO) Take 1 tablet by mouth every morning.     . Multiple Vitamins-Minerals (OCUVITE EYE HEALTH FORMULA PO) Take 1 tablet by mouth daily at 12 noon.     . NON FORMULARY Occuvite    . PROAIR HFA 108 (90 BASE) MCG/ACT inhaler INHALE 2 PUFFS BY MOUTH EVERY 6 HOURS AS NEEDED FOR WHEEZING 8.5 g 6  . Spacer/Aero-Holding Chambers (AEROCHAMBER PLUS FLO-VU LARGE) MISC Use as directed 1 each 0  . Tiotropium Bromide-Olodaterol (STIOLTO RESPIMAT) 2.5-2.5 MCG/ACT AERS Inhale 2 puffs into the lungs every morning. 4 g 3  . vitamin B-12 (CYANOCOBALAMIN) 500 MCG tablet Take 500 mcg by mouth every morning.      No current facility-administered medications on file prior to visit.

## 2014-11-12 ENCOUNTER — Telehealth: Payer: Self-pay | Admitting: *Deleted

## 2014-11-12 DIAGNOSIS — H2513 Age-related nuclear cataract, bilateral: Secondary | ICD-10-CM | POA: Diagnosis not present

## 2014-11-12 DIAGNOSIS — H52203 Unspecified astigmatism, bilateral: Secondary | ICD-10-CM | POA: Diagnosis not present

## 2014-11-12 NOTE — Telephone Encounter (Signed)
Initiated PA for Qvar thru Cover My Meds; Key: [id number] Sent for review; Will await response.

## 2014-11-13 NOTE — Telephone Encounter (Signed)
Pt has over 19 drug intolerance  If she is having chest pain she need to evaluated- GO to ER , PCP or cardiologist ASAP .  PA was started for QVAR , will have to wait to see if covered by insurance If she can get her formulary that would be most helpful for her next visit with .DR. MCQuaid.  Please contact office for sooner follow up if symptoms do not improve or worsen or seek emergency care

## 2014-11-14 NOTE — Telephone Encounter (Signed)
Insurance has denied the Qvar. States pt must try Serevent Diskus.

## 2014-11-14 NOTE — Telephone Encounter (Signed)
Per TP Pt needs to make ov to discuss a substitute for Qvar  She may be allergy to Serevent Diskus She needs to bring formulary to ov.   LVM for pt to return call  Pt has ov on 12/18/14.

## 2014-11-14 NOTE — Telephone Encounter (Signed)
Patient says that she sent several emails requesting if she can take less of the Qvar daily (half the dose).  She likes the way it works, but thinks that she is taking too high a dose and wants to know if she can take less of it.  She said that she is aware that her insurance will not cover the Qvar, but says that if it works at a lower dose, that she would like to file an appeal to have insurance cover this medication.  Pt is aware that TP is out of office until Monday and says okay to discuss with TP on Monday.  TP - please advise.

## 2014-11-14 NOTE — Telephone Encounter (Signed)
Pt cb, [phone]

## 2014-11-16 NOTE — Telephone Encounter (Signed)
Ok , can change to QVAR 54mg 1-2 puff Twice daily   Can send PA .  Make sure she has follow up  Please contact office for sooner follow up if symptoms do not improve or worsen or seek emergency care

## 2014-11-17 NOTE — Telephone Encounter (Signed)
Pt returning call has been trying to reach yall sine last wed.Melissa Ferrell

## 2014-11-17 NOTE — Telephone Encounter (Signed)
Patient has follow up appt with BQ on 12/1 Left message for patient to call back

## 2014-11-17 NOTE — Telephone Encounter (Signed)
She can be reached @ [phone].Melissa Ferrell

## 2014-11-17 NOTE — Telephone Encounter (Signed)
Patient wants to try the Qvar at the new dose before we do an appeal on it.  She says that she will call us or message [place] in 2 weeks to let us know if the new dose is working.  Will hold in triage until pt calls back in 2 weeks to confirm dose.  If it works, will need to Freight forward[employer].

## 2014-11-19 ENCOUNTER — Telehealth: Payer: Self-pay | Admitting: Pulmonary Disease

## 2014-11-19 NOTE — Telephone Encounter (Signed)
OK by me 

## 2014-11-19 NOTE — Telephone Encounter (Signed)
Spoke with pt. States that we reduced her Qvar to 1 puff twice daily. Reports increased BP, runny nose and tremors. Wants to stop Qvar and start taking Spiriva again. Has upcoming appointment with BQ next month.  BQ - please advise. Thanks.

## 2014-11-19 NOTE — Telephone Encounter (Signed)
Spoke with pt,aware of recs.  Nothing further needed.  

## 2014-11-20 NOTE — Telephone Encounter (Signed)
Hold in triage for now

## 2014-11-28 NOTE — Telephone Encounter (Signed)
Attempted to call pt Someone picked up other end but I could not get a response and then line went dead Will try to call pt back later

## 2014-12-01 NOTE — Telephone Encounter (Signed)
Attempted to call pt. Could not leave voicemail. Will try to call pt back later

## 2014-12-02 ENCOUNTER — Ambulatory Visit (INDEPENDENT_AMBULATORY_CARE_PROVIDER_SITE_OTHER): Payer: Medicare Other | Admitting: Family Medicine

## 2014-12-02 ENCOUNTER — Encounter: Payer: Self-pay | Admitting: Family Medicine

## 2014-12-02 VITALS — BP 148/90 | HR 67 | Temp 97.8°F | Ht 64.0 in | Wt 188.5 lb

## 2014-12-02 DIAGNOSIS — F39 Unspecified mood [affective] disorder: Secondary | ICD-10-CM | POA: Insufficient documentation

## 2014-12-02 DIAGNOSIS — I739 Peripheral vascular disease, unspecified: Secondary | ICD-10-CM

## 2014-12-02 DIAGNOSIS — I2510 Atherosclerotic heart disease of native coronary artery without angina pectoris: Secondary | ICD-10-CM

## 2014-12-02 DIAGNOSIS — I10 Essential (primary) hypertension: Secondary | ICD-10-CM

## 2014-12-02 DIAGNOSIS — F3342 Major depressive disorder, recurrent, in full remission: Secondary | ICD-10-CM

## 2014-12-02 DIAGNOSIS — J439 Emphysema, unspecified: Secondary | ICD-10-CM

## 2014-12-02 DIAGNOSIS — E785 Hyperlipidemia, unspecified: Secondary | ICD-10-CM | POA: Diagnosis not present

## 2014-12-02 MED ORDER — ALPRAZOLAM 0.25 MG PO TABS: ORAL_TABLET | ORAL | Status: DC

## 2014-12-02 NOTE — Patient Instructions (Signed)
BEFORE YOU LEAVE: -schedule follow up in 3 month  We recommend the following healthy lifestyle measures: - eat a healthy whole foods diet consisting of regular small meals composed of vegetables, fruits, beans, nuts, seeds, healthy meats such as white chicken and fish and whole grains.  - avoid sweets, white starchy foods, fried foods, fast food, processed foods, sodas, red meet and other fattening foods.  - get a least 150-300 minutes of aerobic exercise per week.   I advise caution with the use of xanax and do not routinely prescribe for regular use. You have decided to take a medication that is a controlled substance for treatment of your medical problem. While this medication has benefits and can help you with your illness, as we discussed, it also has considerable risks. You have been properly informed of the risks and alternatives and re-iterate here that this medication can cause:  Altered Mental Capacity or Alertness: Do not drive or operate machinery while taking this medication  Dependence: A physiological state that can occur with regular drug use and results in withdrawal symptoms when drug use is abruptly discontinued. This can happen even with a short course of this medication.  Addiction: A chronic, relapsing disease characterized by compulsive drug-seeking and use despite negative consequences and by long-lasting changes in the brain.  Numerous other side effects including some that can be serious and/or life threatening. Please discuss these side effects with your pharmacist.  I advise: -that you use as little of this medication as possible and use only as directed for as short of time as possible -keep this medication in a safe and locked location away from children or others -do not share this medication -dispose of any unused medication in a medication drop box -do not take this medication with other controlled or sedating substances, alcohol or drugs other then those  approved by your doctor

## 2014-12-02 NOTE — Progress Notes (Signed)
Pre visit review using our clinic review tool, if applicable. No additional management support is needed unless otherwise documented below in the visit note. 

## 2014-12-02 NOTE — Progress Notes (Addendum)
HPI:  GAD/Hx of Major Depression: -very anxious about me knowing her hx, reports discontent with PCPs in the past and reports took last PCP 3 years to get to know her and she wants me to know her now -hx of hospitalization in the past after divorce from husband remotely -hx of generalized anxiety and panic attack in the past -mild depressed mood intermittently since, takes 1/2 to one tablet of xanax about 15-20 times per month for sleep -I advised her at her initial visit that I do not recommend or prescribe benzos for regular use, particularly if not undergoing regular CBT and using safer options -she is not interested in CBT or other options, reports this is all that works for her  -denies: SI, thoughts of self harm - she has been hospitalized for her anxiety   HDL/Obesity: -she reports does not tolerate any medications for this -takes krill oil -reports couldn't move legs with statins -trying to work on exercise- wi-fit; working on diet -LDL not at goal -she has done the [employer] and she is exercising and eating well and has lost > 10 lbs  HTN/Carotid art dz/PAD/LE edema: -sees cardiologist and vascular doctor for this; BP not controlled in office, but she reports controlled at home and per cardiologist no change needed in meds -meds: asa '81mg'$ , coreg 12.5 (she take 1.5 tablets), hctz '25mg'$ , losartan '50mg'$  -Denies: CP, palpations, swelling -wears compression socks  COPD w emphysema: -sees pulmonologist for this, recent PFT stable -meds: spiriva, proair -she is upset about recent events with her inhalers, reports was not happy with pulm nurses or the advanced practitioner she saw  Memory Loss: -seeing neurologist and per last notes advising to undergo neuropsychological testing as felt was related to anxiety  Osteoporosis: -reports can't take ANY medications for this - does not tolerate -on calcium and Vit D -she is cautious and uses cane    ROS: See pertinent positives  and negatives per HPI.  Past Medical History  Diagnosis Date  . HTN (hypertension)   . Hyperlipidemia     intol to statins  . IBS (irritable bowel syndrome)   . Anxiety and depression   . Carotid artery disease (HCC)     hx CAD, dCHF listed in [place] - sees cardiologist, vasc and neurologist  . COPD (chronic obstructive pulmonary disease) ([place])     sees pulmonology  . Osteoporosis   . GERD (gastroesophageal reflux disease)     hx PUD 2ndary to NSAIDs 1975  . Hx of adenomatous colonic polyps   . Hemorrhoids   . Migraine headache   . Rheumatoid arthritis(714.0)     per her report, in remission for many years per her report  . DDD (degenerative disc disease)   . Fracture, spinal   . Disseminated superficial actinic porokeratosis     bilateral calves  . Varicose veins     hx LE edema  . Memory loss     seen by neurology in 2015 and referred to neuropsych for anxiety  . RHEUMATIC FEVER, HX OF 10/15/2006    Qualifier: Diagnosis of  By: Jenny Reichmann MD, Hunt Oris     Past Surgical History  Procedure Laterality Date  . Thyroglossal duct cyst      1943, 1944, 1973  . Chronically infected lymph node removed  1973    Family History  Problem Relation Age of Onset  . Heart disease Mother     MI  . Hypertension Mother   . Heart attack Mother   .  Stomach cancer Brother   . Cancer Brother   . Heart disease Brother   . Hypertension Brother   . Heart attack Brother   . AAA (abdominal aortic aneurysm) Brother   . Aneurysm Brother     brain  . Melanoma Brother   . Colon cancer Brother   . Heart disease Brother   . Alcohol abuse    . Heart attack Brother 1  . Allergies Sister   . Hypertension Sister   . Breast cancer Maternal Grandmother   . Brain cancer Father   . Cancer Father     Social History   Social History  . Marital Status: Divorced    Spouse Name: N/A  . Number of Children: 3  . Years of Education: N/A   Occupational History  . Retired-homemaker    Social History  Main Topics  . Smoking status: Former Smoker -- 3.00 packs/day for 30 years    Types: Cigarettes    Quit date: 01/17/1985  . Smokeless tobacco: Never Used     Comment: previous 80 pack year history  . Alcohol Use: No  . Drug Use: No  . Sexual Activity: No   Other Topics Concern  . None   Social History Narrative   Work or School: volunteers at [employer] Situation: lives at [place]:       Lifestyle: no regular exercise; diet is ok           Current outpatient prescriptions:  .  ALPRAZolam (XANAX) 0.25 MG tablet, TAKE ONE HALF TABLET BY MOUTH AS NEEDED for severe anxiety or panic - not more then every 12 hours., Disp: 45 tablet, Rfl: 1 .  ARTIFICIAL TEAR OP, Apply to eye., Disp: , Rfl:  .  aspirin 81 MG tablet, Take 81 mg by mouth 2 (two) times daily. , Disp: , Rfl:  .  Calcium Carbonate-Vitamin D 600-400 MG-UNIT per tablet, Take 1 tablet by mouth 2 (two) times daily. , Disp: , Rfl:  .  carvedilol (COREG) 12.5 MG tablet, Take 1 tablet (12.5 mg total) by mouth 2 (two) times daily with a meal. (Patient taking differently: Take 12.5 mg by mouth 2 (two) times daily with a meal. Take one and one-half tablet by mouth twice daily with food.), Disp: 270 tablet, Rfl: 3 .  Flaxseed, Linseed, (FLAXSEED OIL) 1000 MG CAPS, Take 1 capsule by mouth daily at 12 noon. , Disp: , Rfl:  .  folic acid (FOLVITE) 350 MCG tablet, Take 400 mcg by mouth daily., Disp: , Rfl:  .  hydrochlorothiazide (HYDRODIURIL) 25 MG tablet, TAKE 1 TABLET BY MOUTH EVERY MORNING, Disp: 90 tablet, Rfl: 3 .  KRILL OIL PO, Take 500 mg by mouth., Disp: , Rfl:  .  Lecithin 1200 MG CAPS, Take 1,200 mg by mouth every morning. , Disp: , Rfl:  .  losartan (COZAAR) 50 MG tablet, Take 1 tablet (50 mg total) by mouth 2 (two) times daily., Disp: 180 tablet, Rfl: 3 .  Magnesium 250 MG TABS, Take 1 tablet by mouth every morning. , Disp: , Rfl:  .  Multiple Vitamins-Minerals (CENTRUM SILVER PO), Take 1  tablet by mouth every morning. , Disp: , Rfl:  .  Multiple Vitamins-Minerals (OCUVITE EYE HEALTH FORMULA PO), Take 1 tablet by mouth daily at 12 noon. , Disp: , Rfl:  .  NON FORMULARY, Occuvite, Disp: , Rfl:  .  PROAIR HFA 108 (90 BASE) MCG/ACT  inhaler, INHALE 2 PUFFS BY MOUTH EVERY 6 HOURS AS NEEDED FOR WHEEZING, Disp: 8.5 g, Rfl: 6 .  Spacer/Aero-Holding Chambers (AEROCHAMBER PLUS FLO-VU LARGE) MISC, Use as directed, Disp: 1 each, Rfl: 0 .  tiotropium (SPIRIVA) 18 MCG inhalation capsule, Place 18 mcg into inhaler and inhale daily., Disp: , Rfl:  .  Tiotropium Bromide-Olodaterol (STIOLTO RESPIMAT) 2.5-2.5 MCG/ACT AERS, Inhale 2 puffs into the lungs every morning., Disp: 4 g, Rfl: 3 .  vitamin B-12 (CYANOCOBALAMIN) 500 MCG tablet, Take 500 mcg by mouth every morning. , Disp: , Rfl:   EXAM:  Filed Vitals:   12/02/14 1020  BP: 148/90  Pulse: 67  Temp: 97.8 F (36.6 C)    Body mass index is 32.34 kg/(m^2).  GENERAL: vitals reviewed and listed above, alert, oriented, appears well hydrated and in no acute distress  HEENT: atraumatic, conjunttiva clear, no obvious abnormalities on inspection of external nose and ears  NECK: no obvious masses on inspection  LUNGS: clear to auscultation bilaterally, no wheezes, rales or rhonchi, good air movement  CV: HRRR, no peripheral edema  MS: moves all extremities without noticeable abnormality  PSYCH: anxious  ASSESSMENT AND PLAN:  Discussed the following assessment and plan:  Depression and anxiety - Plan: ALPRAZolam (XANAX) 0.25 MG tablet  Hyperlipemia  Essential hypertension  Pulmonary emphysema, unspecified emphysema type (HCC)  PVD (peripheral vascular disease) (HCC)  Recurrent major depressive disorder, in full remission ([place])  -she is very anxious, some seems stems from anxiety related to transition of PCPs -reassured her that I will do my best to get to know her and that I sympathize with anxiety related to that transition -  is spent a long time with her at her initial visit and today, despite getting behind in clinic  -I am not able to erase or change her discontent with other providers or prior providers -she does not want to further treat HTN or HLD, that is her choice and I congratulated her on lifestyle changes, BP today good on recheck -she is adamant that I rx xanax, a medication I do not regularly recommend or rx for anxiety or sleep unless treatment with other safer options fails and or for breakthrough symptoms. she is not interested in other options, I made an exception based on her well informed preferences and will rx #15 per month, advised of risks, other options and advised psychiatric care for escalating needs -Patient advised to return or notify a doctor immediately if symptoms worsen or persist or new concerns arise.  Patient Instructions  BEFORE YOU LEAVE: -schedule follow up in 3 month  We recommend the following healthy lifestyle measures: - eat a healthy whole foods diet consisting of regular small meals composed of vegetables, fruits, beans, nuts, seeds, healthy meats such as white chicken and fish and whole grains.  - avoid sweets, white starchy foods, fried foods, fast food, processed foods, sodas, red meet and other fattening foods.  - get a least 150-300 minutes of aerobic exercise per week.   I advise caution with the use of xanax and do not routinely prescribe for regular use. You have decided to take a medication that is a controlled substance for treatment of your medical problem. While this medication has benefits and can help you with your illness, as we discussed, it also has considerable risks. You have been properly informed of the risks and alternatives and re-iterate here that this medication can cause:  Altered Mental Capacity or Alertness: Do not drive or operate  machinery while taking this medication  Dependence: A physiological state that can occur with regular drug use and  results in withdrawal symptoms when drug use is abruptly discontinued. This can happen even with a short course of this medication.  Addiction: A chronic, relapsing disease characterized by compulsive drug-seeking and use despite negative consequences and by long-lasting changes in the brain.  Numerous other side effects including some that can be serious and/or life threatening. Please discuss these side effects with your pharmacist.  I advise: -that you use as little of this medication as possible and use only as directed for as short of time as possible -keep this medication in a safe and locked location away from children or others -do not share this medication -dispose of any unused medication in a medication drop box -do not take this medication with other controlled or sedating substances, alcohol or drugs other then those approved by your doctor        Colin Benton R.

## 2014-12-04 ENCOUNTER — Other Ambulatory Visit: Payer: Self-pay | Admitting: Family Medicine

## 2014-12-04 ENCOUNTER — Encounter: Payer: Self-pay | Admitting: Family Medicine

## 2014-12-05 ENCOUNTER — Telehealth: Payer: Self-pay | Admitting: Pulmonary Disease

## 2014-12-05 MED ORDER — TIOTROPIUM BROMIDE MONOHYDRATE 18 MCG IN CAPS: 18.0000 ug | ORAL_CAPSULE | Freq: Every day | RESPIRATORY_TRACT | Status: DC

## 2014-12-05 NOTE — Telephone Encounter (Signed)
Pt calling requesting a refill of her Spiriva to last her until her upcoming visit 12/2014. Pt states that she tolerates it better than QVAR and Stiolto. Switch back to Spiriva was okayed by BQ. Nothing further needed.  Juanito Doom, MD at 11/19/2014 11:58 AM     Status: Signed       Expand All Collapse All   OK by me            Randa Spike, CMA at 11/19/2014 8:21 AM     Status: Signed       Expand All Collapse All   Spoke with pt. States that we reduced her Qvar to 1 puff twice daily. Reports increased BP, runny nose and tremors. Wants to stop Qvar and start taking Spiriva again. Has upcoming appointment with BQ next month.  BQ - please advise. Thanks.

## 2014-12-18 ENCOUNTER — Encounter: Payer: Self-pay | Admitting: Pulmonary Disease

## 2014-12-18 ENCOUNTER — Ambulatory Visit (INDEPENDENT_AMBULATORY_CARE_PROVIDER_SITE_OTHER): Payer: Medicare Other | Admitting: Pulmonary Disease

## 2014-12-18 VITALS — BP 142/76 | HR 65 | Ht 63.0 in | Wt 188.0 lb

## 2014-12-18 DIAGNOSIS — J439 Emphysema, unspecified: Secondary | ICD-10-CM

## 2014-12-18 DIAGNOSIS — I2510 Atherosclerotic heart disease of native coronary artery without angina pectoris: Secondary | ICD-10-CM

## 2014-12-18 MED ORDER — ALBUTEROL SULFATE HFA 108 (90 BASE) MCG/ACT IN AERS: INHALATION_SPRAY | RESPIRATORY_TRACT | Status: DC

## 2014-12-18 MED ORDER — TIOTROPIUM BROMIDE MONOHYDRATE 18 MCG IN CAPS: 18.0000 ug | ORAL_CAPSULE | Freq: Every day | RESPIRATORY_TRACT | Status: DC

## 2014-12-18 MED ORDER — DOXYCYCLINE HYCLATE 100 MG PO TABS: 100.0000 mg | ORAL_TABLET | Freq: Two times a day (BID) | ORAL | Status: DC

## 2014-12-18 NOTE — Assessment & Plan Note (Signed)
She has COPD for many years of tobacco use but she also has a significant bronchodilator response. 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 Further, she has had negative responses to any agent that contains a long-acting beta agonist. Because she only has moderate airflow obstruction, her disease has been stable recently, she has not had any flareups recently, and she only has minimal symptoms I see no reason to treat with anything more than Spiriva alone.  Plan: Spiriva daily (would avoid restroom at because of concern for allergic reaction to the propellant in Stiolto) Albuterol as needed

## 2014-12-18 NOTE — Patient Instructions (Signed)
Take Spiriva in the morning and feel Use albuterol as needed for shortness of breath Take the doxycycline if needed for increasing chest congestion, change in sputum color. I recommend that she take this with a probiotic such as yogurt and that she use sunscreen when using this product We will see you back in 6 months or sooner if needed

## 2014-12-18 NOTE — Progress Notes (Signed)
Subjective:    Patient ID: Melissa Ferrell, female    DOB: 21-Mar-1935, 79 y.o.   MRN: [id number]  Synopsis: COPD followed by Dr. Gwenette Ferrell prior to 06/2014; she has a history of rheumatoid arthritis. She previously smoked 3 ppd and quit in 1986.  PFT's 09/12/2011:  FEV1  1.37 (79%) ratio 47, FEV1 increased 14% with BD, +++ airtrapping, DLCO 60% On spiriva, arcapta added 11/2011 as a trial with ++response.  Pt wanted to take arcapta alone and stop spiriva  >> saw worsening breathing with arcapta alone, so restarted spiriva  Worsening on spiriva/arcapta, no coverage for stiolto; Breo did not help. Trial of breo 03/2014   August 2016 pulmonary function testing> ratio 58%, FEV1 1.34 L (69% predicted, 20% change with broncho-dilator), total lung capacity 4.87 L (99% predicted), DLCO 15.96 (69% predicted) sees.   HPI Chief Complaint  Patient presents with  . Follow-up    pt has brought formulary today to discuss inhalers.     Melissa Ferrell had some problems with the Stiolto so she had to stop the [employer].  She discussed the situation with her pharmacist and she thinks that this may have been related to propellant. She didn't like the QVar and said that this was associated with worsening chest tightness and back pain. She says that she had more sinus inflammation and her "throat was closing" on the Stiolto.  Lately she says that she has been travelling and went to [place] and she was breathing fine then.   In general she thinks that she is better.  She was exhausted when she was taking the Stiolto and the QVar.  She is back to walking 20 minutes at a time at the [facility].  Past Medical History  Diagnosis Date  . HTN (hypertension)   . Hyperlipidemia     intol to statins  . IBS (irritable bowel syndrome)   . Anxiety and depression   . Carotid artery disease (HCC)     hx CAD, dCHF listed in [place] - sees cardiologist, vasc and neurologist  . COPD (chronic obstructive pulmonary disease) ([place])      sees pulmonology  . Osteoporosis   . GERD (gastroesophageal reflux disease)     hx PUD 2ndary to NSAIDs 1975  . Hx of adenomatous colonic polyps   . Hemorrhoids   . Migraine headache   . Rheumatoid arthritis(714.0)     per her report, in remission for many years per her report  . DDD (degenerative disc disease)   . Fracture, spinal   . Disseminated superficial actinic porokeratosis     bilateral calves  . Varicose veins     hx LE edema  . Memory loss     seen by neurology in 2015 and referred to neuropsych for anxiety  . RHEUMATIC FEVER, HX OF 10/15/2006    Qualifier: Diagnosis of  By: Jenny Reichmann MD, Hunt Oris       Review of Systems  Constitutional: Negative for fever, diaphoresis and fatigue.  HENT: Negative for rhinorrhea, sinus pressure and sneezing.   Respiratory: Negative for cough, shortness of breath and wheezing.   Cardiovascular: Negative for chest pain, palpitations and leg swelling.       Objective:   Physical Exam Filed Vitals:   12/18/14 1347  BP: 142/76  Pulse: 65  Height: '5\' 3"'$  (1.6 m)  Weight: 188 lb (85.276 kg)  SpO2: 97%   RA  Gen: well appearing HENT: OP clear,  neck supple PULM: CTA B, normal percussion CV:  RRR, no mgr, trace edema GI: BS+, soft, nontender Derm: no cyanosis or rash Psyche: normal mood and affect      Assessment & Plan:  COPD (chronic obstructive pulmonary disease) with emphysema (HCC) She has COPD for many years of tobacco use but she also has a significant bronchodilator response. 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 Further, she has had negative responses to any agent that contains a long-acting beta agonist. Because she only has moderate airflow obstruction, her disease has been stable recently, she has not had any flareups recently, and she only has minimal symptoms I see no reason to treat with anything more than Spiriva  alone.  Plan: Spiriva daily (would avoid restroom at because of concern for allergic reaction to the propellant in Stiolto) Albuterol as needed     Current outpatient prescriptions:  .  albuterol (PROAIR HFA) 108 (90 BASE) MCG/ACT inhaler, INHALE 2 PUFFS BY MOUTH EVERY 6 HOURS AS NEEDED FOR WHEEZING, Disp: 8.5 g, Rfl: 6 .  ALPRAZolam (XANAX) 0.25 MG tablet, TAKE ONE HALF TABLET BY MOUTH AS NEEDED for severe anxiety or panic - not more then every 12 hours., Disp: 45 tablet, Rfl: 1 .  ARTIFICIAL TEAR OP, Apply to eye., Disp: , Rfl:  .  aspirin 81 MG tablet, Take 81 mg by mouth 2 (two) times daily. , Disp: , Rfl:  .  Calcium Carbonate-Vitamin D 600-400 MG-UNIT per tablet, Take 1 tablet by mouth 2 (two) times daily. , Disp: , Rfl:  .  carvedilol (COREG) 12.5 MG tablet, Take 1 tablet (12.5 mg total) by mouth 2 (two) times daily with a meal. (Patient taking differently: Take 12.5 mg by mouth 2 (two) times daily with a meal. Take one and one-half tablet by mouth twice daily with food.), Disp: 270 tablet, Rfl: 3 .  Flaxseed, Linseed, (FLAXSEED OIL) 1000 MG CAPS, Take 1 capsule by mouth daily at 12 noon. , Disp: , Rfl:  .  folic acid (FOLVITE) 637 MCG tablet, Take 400 mcg by mouth daily., Disp: , Rfl:  .  hydrochlorothiazide (HYDRODIURIL) 25 MG tablet, TAKE 1 TABLET BY MOUTH EVERY MORNING, Disp: 90 tablet, Rfl: 3 .  KRILL OIL PO, Take 500 mg by mouth., Disp: , Rfl:  .  Lecithin 1200 MG CAPS, Take 1,200 mg by mouth every morning. , Disp: , Rfl:  .  losartan (COZAAR) 50 MG tablet, Take 1 tablet (50 mg total) by mouth 2 (two) times daily., Disp: 180 tablet, Rfl: 3 .  Magnesium 250 MG TABS, Take 1 tablet by mouth every morning. , Disp: , Rfl:  .  Multiple Vitamins-Minerals (CENTRUM SILVER PO), Take 1 tablet by mouth every morning. , Disp: , Rfl:  .  Multiple Vitamins-Minerals (OCUVITE EYE HEALTH FORMULA PO), Take 1 tablet by mouth daily at 12 noon. , Disp: , Rfl:  .  NON FORMULARY, Occuvite, Disp: , Rfl:   .  tiotropium (SPIRIVA) 18 MCG inhalation capsule, Place 1 capsule (18 mcg total) into inhaler and inhale daily., Disp: 30 capsule, Rfl: 11 .  vitamin B-12 (CYANOCOBALAMIN) 500 MCG tablet, Take 500 mcg by mouth every morning. , Disp: , Rfl:  .  doxycycline (VIBRA-TABS) 100 MG tablet, Take 1 tablet (100 mg total) by mouth 2 (two) times daily., Disp: 10 tablet, Rfl: 2

## 2015-02-05 DIAGNOSIS — L814 Other melanin hyperpigmentation: Secondary | ICD-10-CM | POA: Diagnosis not present

## 2015-02-05 DIAGNOSIS — L565 Disseminated superficial actinic porokeratosis (DSAP): Secondary | ICD-10-CM | POA: Diagnosis not present

## 2015-02-05 DIAGNOSIS — L821 Other seborrheic keratosis: Secondary | ICD-10-CM | POA: Diagnosis not present

## 2015-02-05 DIAGNOSIS — D692 Other nonthrombocytopenic purpura: Secondary | ICD-10-CM | POA: Diagnosis not present

## 2015-03-04 ENCOUNTER — Ambulatory Visit: Payer: Medicare Other | Admitting: Family Medicine

## 2015-03-05 ENCOUNTER — Ambulatory Visit (INDEPENDENT_AMBULATORY_CARE_PROVIDER_SITE_OTHER): Payer: Medicare Other | Admitting: Family Medicine

## 2015-03-05 ENCOUNTER — Encounter: Payer: Self-pay | Admitting: Family Medicine

## 2015-03-05 VITALS — BP 136/78 | HR 69 | Temp 97.9°F | Ht 63.0 in | Wt 192.8 lb

## 2015-03-05 DIAGNOSIS — F411 Generalized anxiety disorder: Secondary | ICD-10-CM

## 2015-03-05 DIAGNOSIS — E785 Hyperlipidemia, unspecified: Secondary | ICD-10-CM | POA: Diagnosis not present

## 2015-03-05 DIAGNOSIS — I10 Essential (primary) hypertension: Secondary | ICD-10-CM | POA: Diagnosis not present

## 2015-03-05 DIAGNOSIS — J439 Emphysema, unspecified: Secondary | ICD-10-CM | POA: Diagnosis not present

## 2015-03-05 NOTE — Progress Notes (Signed)
Pre visit review using our clinic review tool, if applicable. No additional management support is needed unless otherwise documented below in the visit note. 

## 2015-03-05 NOTE — Progress Notes (Addendum)
HPI:  Melissa Ferrell is a pleasant 80 year old female with past medical history significant for anxiety, COPD followed by her pulmonologist, hypertension, carotid artery disease and hyperlipidemia followed by her cardiologist and IBS here for a follow-up visit. She reports she has been doing quite well after tweaking her COPD inhalers with her pulmonologist. She reports severe allergy to many inhalers and is very happy that her pulmonologist has now found a regimen that works for her. Reports that the extreme exhaustion she was feeling in 2016 resolved. She requests a refill on her Xanax which she has been taking on occasion for sleep. She refused any other treatment options for sleep and anxiety at her initial visits with me. She is very well informed of the risks associated with this medication and knows that I do not typically prescribe benzodiazepines, however she was adamant about me prescribing this medication. I agreed to Rx her xanax for occasional use #15 per month and advised psychiatry care if requiring escalating amounts. She is not due for a refill.  ROS: See pertinent positives and negatives per HPI.  Past Medical History  Diagnosis Date  . HTN (hypertension)   . Hyperlipidemia     intol to statins  . IBS (irritable bowel syndrome)   . Anxiety and depression   . Carotid artery disease (HCC)     hx CAD, dCHF listed in [place] - sees cardiologist, vasc and neurologist  . COPD (chronic obstructive pulmonary disease) ([place])     sees pulmonology  . Osteoporosis   . GERD (gastroesophageal reflux disease)     hx PUD 2ndary to NSAIDs 1975  . Hx of adenomatous colonic polyps   . Hemorrhoids   . Migraine headache   . Rheumatoid arthritis(714.0)     per her report, in remission for many years per her report  . DDD (degenerative disc disease)   . Fracture, spinal   . Disseminated superficial actinic porokeratosis     bilateral calves  . Varicose veins     hx LE edema  . Memory loss      seen by neurology in 2015 and referred to neuropsych for anxiety  . RHEUMATIC FEVER, HX OF 10/15/2006    Qualifier: Diagnosis of  By: Jenny Reichmann MD, Hunt Oris     Past Surgical History  Procedure Laterality Date  . Thyroglossal duct cyst      1943, 1944, 1973  . Chronically infected lymph node removed  1973    Family History  Problem Relation Age of Onset  . Heart disease Mother     MI  . Hypertension Mother   . Heart attack Mother   . Stomach cancer Brother   . Cancer Brother   . Heart disease Brother   . Hypertension Brother   . Heart attack Brother   . AAA (abdominal aortic aneurysm) Brother   . Aneurysm Brother     brain  . Melanoma Brother   . Colon cancer Brother   . Heart disease Brother   . Alcohol abuse    . Heart attack Brother 59  . Allergies Sister   . Hypertension Sister   . Breast cancer Maternal Grandmother   . Brain cancer Father   . Cancer Father     Social History   Social History  . Marital Status: Divorced    Spouse Name: N/A  . Number of Children: 3  . Years of Education: N/A   Occupational History  . Retired-homemaker    Social History Main Topics  .  Smoking status: Former Smoker -- 3.00 packs/day for 30 years    Types: Cigarettes    Quit date: 01/17/1985  . Smokeless tobacco: Never Used     Comment: previous 80 pack year history  . Alcohol Use: No  . Drug Use: No  . Sexual Activity: No   Other Topics Concern  . None   Social History Narrative   Work or School: volunteers at [employer] Situation: lives at [place]:       Lifestyle: no regular exercise; diet is ok           Current outpatient prescriptions:  .  albuterol (PROAIR HFA) 108 (90 BASE) MCG/ACT inhaler, INHALE 2 PUFFS BY MOUTH EVERY 6 HOURS AS NEEDED FOR WHEEZING, Disp: 8.5 g, Rfl: 6 .  ALPRAZolam (XANAX) 0.25 MG tablet, TAKE ONE HALF TABLET BY MOUTH AS NEEDED for severe anxiety or panic - not more then every 12 hours., Disp: 45  tablet, Rfl: 1 .  ARTIFICIAL TEAR OP, Apply to eye., Disp: , Rfl:  .  aspirin 81 MG tablet, Take 81 mg by mouth 2 (two) times daily. , Disp: , Rfl:  .  Calcium Carbonate-Vitamin D 600-400 MG-UNIT per tablet, Take 1 tablet by mouth 2 (two) times daily. , Disp: , Rfl:  .  carvedilol (COREG) 12.5 MG tablet, Take 1 tablet (12.5 mg total) by mouth 2 (two) times daily with a meal. (Patient taking differently: Take 18.75 mg by mouth 2 (two) times daily with a meal. Take one and one-half tablet by mouth twice daily with food.), Disp: 270 tablet, Rfl: 3 .  doxycycline (VIBRA-TABS) 100 MG tablet, Take 1 tablet (100 mg total) by mouth 2 (two) times daily., Disp: 10 tablet, Rfl: 2 .  Flaxseed, Linseed, (FLAXSEED OIL) 1000 MG CAPS, Take 1 capsule by mouth daily at 12 noon. , Disp: , Rfl:  .  folic acid (FOLVITE) 540 MCG tablet, Take 400 mcg by mouth daily., Disp: , Rfl:  .  hydrochlorothiazide (HYDRODIURIL) 25 MG tablet, TAKE 1 TABLET BY MOUTH EVERY MORNING, Disp: 90 tablet, Rfl: 3 .  KRILL OIL PO, Take 500 mg by mouth., Disp: , Rfl:  .  Lecithin 1200 MG CAPS, Take 1,200 mg by mouth every morning. , Disp: , Rfl:  .  losartan (COZAAR) 50 MG tablet, Take 1 tablet (50 mg total) by mouth 2 (two) times daily., Disp: 180 tablet, Rfl: 3 .  Magnesium 250 MG TABS, Take 1 tablet by mouth every morning. , Disp: , Rfl:  .  Multiple Vitamins-Minerals (CENTRUM SILVER PO), Take 1 tablet by mouth every morning. , Disp: , Rfl:  .  Multiple Vitamins-Minerals (OCUVITE EYE HEALTH FORMULA PO), Take 1 tablet by mouth daily at 12 noon. , Disp: , Rfl:  .  NON FORMULARY, Occuvite, Disp: , Rfl:  .  tiotropium (SPIRIVA) 18 MCG inhalation capsule, Place 1 capsule (18 mcg total) into inhaler and inhale daily., Disp: 30 capsule, Rfl: 11 .  vitamin B-12 (CYANOCOBALAMIN) 500 MCG tablet, Take 500 mcg by mouth every morning. , Disp: , Rfl:   EXAM:  Filed Vitals:   03/05/15 1245  BP: 136/78  Pulse: 69  Temp: 97.9 F (36.6 C)    Body  mass index is 34.16 kg/(m^2).  GENERAL: vitals reviewed and listed above, alert, oriented, appears well hydrated and in no acute distress  HEENT: atraumatic, conjunttiva clear, no obvious abnormalities on inspection of external nose and  ears  NECK: no obvious masses on inspection  LUNGS: clear to auscultation bilaterally, no wheezes, rales or rhonchi, good air movement  CV: HRRR, no peripheral edema  MS: moves all extremities without noticeable abnormality  PSYCH: pleasant and cooperative, no obvious depression or anxiety  ASSESSMENT AND PLAN:  Discussed the following assessment and plan:  GAD (generalized anxiety disorder)  Hyperlipemia  Essential hypertension  Pulmonary emphysema, unspecified emphysema type ([place])  -seems to be doing well -staff to check on refill issue with her xanax -follow up in 3 months -Patient advised to return or notify a doctor immediately if symptoms worsen or persist or new concerns arise.  Patient Instructions  Before you leave: -Schedule follow-up in 3 months  We recommend the following healthy lifestyle measures: - eat a healthy whole foods diet consisting of regular small meals composed of vegetables, fruits, beans, nuts, seeds, healthy meats such as white chicken and fish and whole grains.  - avoid sweets, white starchy foods, fried foods, fast food, processed foods, sodas, red meet and other fattening foods.  - get a least 150-300 minutes of aerobic exercise per week.       Colin [place] R.

## 2015-03-05 NOTE — Addendum Note (Signed)
Addended by: Agnes Lawrence on: 03/05/2015 01:50 PM   Modules accepted: Orders

## 2015-03-05 NOTE — Patient Instructions (Signed)
Before you leave: -Schedule follow-up in 3 months  We recommend the following healthy lifestyle measures: - eat a healthy whole foods diet consisting of regular small meals composed of vegetables, fruits, beans, nuts, seeds, healthy meats such as white chicken and fish and whole grains.  - avoid sweets, white starchy foods, fried foods, fast food, processed foods, sodas, red meet and other fattening foods.  - get a least 150-300 minutes of aerobic exercise per week.

## 2015-04-29 ENCOUNTER — Other Ambulatory Visit: Payer: Self-pay | Admitting: Cardiology

## 2015-04-29 NOTE — Telephone Encounter (Signed)
Rx(s) sent to pharmacy electronically.  

## 2015-04-30 ENCOUNTER — Other Ambulatory Visit: Payer: Self-pay | Admitting: Cardiology

## 2015-04-30 NOTE — Telephone Encounter (Signed)
Rx(s) sent to pharmacy electronically.  

## 2015-05-04 ENCOUNTER — Telehealth: Payer: Self-pay | Admitting: Family Medicine

## 2015-05-04 DIAGNOSIS — F39 Unspecified mood [affective] disorder: Secondary | ICD-10-CM

## 2015-05-04 NOTE — Telephone Encounter (Signed)
Pt needs a refill on alprazolam [facility] on [address]

## 2015-05-05 MED ORDER — ALPRAZOLAM 0.25 MG PO TABS: ORAL_TABLET | ORAL | Status: DC

## 2015-05-05 NOTE — Telephone Encounter (Signed)
Per Levada Dy at [facility] the pt picked up the first Rx on 12/02/14 and the second one was picked up on 01/29/15.

## 2015-05-05 NOTE — Telephone Encounter (Signed)
JoAnne, please check with pharmacy to see when she last had a refill? I am providing 15 per month. Provided #45 with one refill in November. Please let me know. Thanks.

## 2015-05-05 NOTE — Telephone Encounter (Signed)
Rx done. 

## 2015-05-05 NOTE — Telephone Encounter (Signed)
Okay to refill #45, no further refills for 90 days. Thanks.

## 2015-05-25 ENCOUNTER — Telehealth: Payer: Self-pay | Admitting: Family Medicine

## 2015-05-25 ENCOUNTER — Encounter: Payer: Self-pay | Admitting: Pulmonary Disease

## 2015-05-25 ENCOUNTER — Ambulatory Visit (INDEPENDENT_AMBULATORY_CARE_PROVIDER_SITE_OTHER): Payer: Medicare Other | Admitting: Pulmonary Disease

## 2015-05-25 VITALS — BP 144/82 | HR 65 | Ht 63.0 in | Wt 195.0 lb

## 2015-05-25 DIAGNOSIS — J439 Emphysema, unspecified: Secondary | ICD-10-CM | POA: Diagnosis not present

## 2015-05-25 NOTE — Telephone Encounter (Signed)
Pt would like to transfer from [place] to Qu[place] Burow is this ok with both of you?

## 2015-05-25 NOTE — Assessment & Plan Note (Signed)
She continues to do really well on Spiriva alone.  This has been a stable interval for her.  I have encouraged her to stay as active as possible, as she has become sedentary recently.  I have encouraged her to try water aerobics.  Plan: Continue Spiriva, as needed albuterol F/u 6 months

## 2015-05-25 NOTE — Telephone Encounter (Signed)
[place] with me if ok with Dr. Quay Burow.

## 2015-05-25 NOTE — Telephone Encounter (Signed)
I am sorry, but I can not accept any interoffice transfers at this time

## 2015-05-25 NOTE — Progress Notes (Signed)
Subjective:    Patient ID: Melissa Ferrell, female    DOB: August 24, 1935, 80 y.o.   MRN: [id number]  Synopsis: COPD followed by Dr. Gwenette Greet prior to 06/2014; she has a history of rheumatoid arthritis. She previously smoked 3 ppd and quit in 1986.  PFT's 09/12/2011:  FEV1  1.37 (79%) ratio 47, FEV1 increased 14% with BD, +++ airtrapping, DLCO 60% On spiriva, arcapta added 11/2011 as a trial with ++response.  Pt wanted to take arcapta alone and stop spiriva  >> saw worsening breathing with arcapta alone, so restarted spiriva  Worsening on spiriva/arcapta, no coverage for stiolto; Breo did not help. Trial of breo 03/2014   August 2016 pulmonary function testing> ratio 58%, FEV1 1.34 L (69% predicted, 20% change with broncho-dilator), total lung capacity 4.87 L (99% predicted), DLCO 15.96 (69% predicted) sees.   HPI Chief Complaint  Patient presents with  . Follow-up    pt doing well-does note some seasonal allergies making pt's sob worsen on occasion.     Melissa Ferrell has been doing really well.  She said that the pollen bothered her a little, but not too bad.  She has not had to use proAir very much.  She is not working out at [employer] too much because of foot, leg, and back pain.  She has a few fractures in her back.  She continues to work out on her Wii fit video game system.   Past Medical History  Diagnosis Date  . HTN (hypertension)   . Hyperlipidemia     intol to statins  . IBS (irritable bowel syndrome)   . Anxiety and depression   . Carotid artery disease (HCC)     hx CAD, dCHF listed in [place] - sees cardiologist, vasc and neurologist  . COPD (chronic obstructive pulmonary disease) ([place])     sees pulmonology  . Osteoporosis   . GERD (gastroesophageal reflux disease)     hx PUD 2ndary to NSAIDs 1975  . Hx of adenomatous colonic polyps   . Hemorrhoids   . Migraine headache   . Rheumatoid arthritis(714.0)     per her report, in remission for many years per her report  . DDD  (degenerative disc disease)   . Fracture, spinal   . Disseminated superficial actinic porokeratosis     bilateral calves  . Varicose veins     hx LE edema  . Memory loss     seen by neurology in 2015 and referred to neuropsych for anxiety  . RHEUMATIC FEVER, HX OF 10/15/2006    Qualifier: Diagnosis of  By: Jenny Reichmann MD, Hunt Oris       Review of Systems  Constitutional: Negative for fever, diaphoresis and fatigue.  HENT: Negative for rhinorrhea, sinus pressure and sneezing.   Respiratory: Negative for cough, shortness of breath and wheezing.   Cardiovascular: Negative for chest pain, palpitations and leg swelling.       Objective:   Physical Exam Filed Vitals:   05/25/15 1331  BP: 144/82  Pulse: 65  Height: '5\' 3"'$  (1.6 m)  Weight: 195 lb (88.451 kg)  SpO2: 97%   RA  Gen: well appearing HENT: OP clear,  neck supple PULM: CTA B, normal percussion CV: RRR, no mgr, trace edema GI: BS+, soft, nontender Derm: no cyanosis or rash Psyche: normal mood and affect      Assessment & Plan:  COPD (chronic obstructive pulmonary disease) with emphysema ([place]) She continues to do really well on Spiriva alone.  This has  been a stable interval for her.  I have encouraged her to stay as active as possible, as she has become sedentary recently.  I have encouraged her to try water aerobics.  Plan: Continue Spiriva, as needed albuterol F/u 6 months     Current outpatient prescriptions:  .  albuterol (PROAIR HFA) 108 (90 BASE) MCG/ACT inhaler, INHALE 2 PUFFS BY MOUTH EVERY 6 HOURS AS NEEDED FOR WHEEZING, Disp: 8.5 g, Rfl: 6 .  ALPRAZolam (XANAX) 0.25 MG tablet, TAKE ONE HALF TABLET BY MOUTH AS NEEDED for severe anxiety or panic - not more then every 12 hours. No further refills for 90 days, Disp: 45 tablet, Rfl: 0 .  ARTIFICIAL TEAR OP, Apply to eye., Disp: , Rfl:  .  aspirin 81 MG tablet, Take 81 mg by mouth 2 (two) times daily. , Disp: , Rfl:  .  Calcium Carbonate-Vitamin D 600-400  MG-UNIT per tablet, Take 1 tablet by mouth 2 (two) times daily. , Disp: , Rfl:  .  carvedilol (COREG) 12.5 MG tablet, Take 18.75 mg by mouth 2 (two) times daily with a meal. , Disp: 270 tablet, Rfl: 3 .  Flaxseed, Linseed, (FLAXSEED OIL) 1000 MG CAPS, Take 1 capsule by mouth daily at 12 noon. , Disp: , Rfl:  .  folic acid (FOLVITE) 355 MCG tablet, Take 400 mcg by mouth daily., Disp: , Rfl:  .  hydrochlorothiazide (HYDRODIURIL) 25 MG tablet, TAKE 1 TABLET BY MOUTH EVERY MORNING, Disp: 90 tablet, Rfl: 2 .  KRILL OIL PO, Take 500 mg by mouth., Disp: , Rfl:  .  Lecithin 1200 MG CAPS, Take 1,200 mg by mouth every morning. , Disp: , Rfl:  .  losartan (COZAAR) 50 MG tablet, Take 1 tablet (50 mg total) by mouth 2 (two) times daily., Disp: 180 tablet, Rfl: 3 .  Magnesium 250 MG TABS, Take 1 tablet by mouth every morning. , Disp: , Rfl:  .  Multiple Vitamins-Minerals (CENTRUM SILVER PO), Take 1 tablet by mouth every morning. , Disp: , Rfl:  .  Multiple Vitamins-Minerals (OCUVITE EYE HEALTH FORMULA PO), Take 1 tablet by mouth daily at 12 noon. , Disp: , Rfl:  .  NON FORMULARY, Occuvite, Disp: , Rfl:  .  tiotropium (SPIRIVA) 18 MCG inhalation capsule, Place 1 capsule (18 mcg total) into inhaler and inhale daily., Disp: 30 capsule, Rfl: 11 .  vitamin B-12 (CYANOCOBALAMIN) 500 MCG tablet, Take 500 mcg by mouth every morning. , Disp: , Rfl:

## 2015-05-25 NOTE — Patient Instructions (Signed)
Continue taking your Spiriva as you are doing We will see you back in 6 months or sooner if needed

## 2015-06-01 NOTE — Telephone Encounter (Signed)
Pt called to see if she can transfer from Dr. Maudie Mercury to Dr. Sharlet Salina. Please advise.

## 2015-06-02 ENCOUNTER — Ambulatory Visit: Payer: Medicare Other | Admitting: Family Medicine

## 2015-06-05 NOTE — Telephone Encounter (Signed)
Am not taking transfers interoffice at this time due to high volume but she can call back in 4-5 months to see if there are openings.

## 2015-06-08 NOTE — Telephone Encounter (Signed)
Pt aware. She will continue to look. Will call back if she can not find anyone.

## 2015-06-16 ENCOUNTER — Encounter: Payer: Self-pay | Admitting: Cardiology

## 2015-07-03 ENCOUNTER — Other Ambulatory Visit: Payer: Self-pay | Admitting: Cardiology

## 2015-07-03 NOTE — Telephone Encounter (Signed)
Rx Refill

## 2015-07-27 ENCOUNTER — Telehealth: Payer: Self-pay | Admitting: Pulmonary Disease

## 2015-07-27 ENCOUNTER — Encounter: Payer: Self-pay | Admitting: Family Medicine

## 2015-07-27 ENCOUNTER — Ambulatory Visit (INDEPENDENT_AMBULATORY_CARE_PROVIDER_SITE_OTHER): Payer: Medicare Other | Admitting: Family Medicine

## 2015-07-27 VITALS — BP 150/90 | HR 68 | Ht 62.0 in | Wt 191.4 lb

## 2015-07-27 DIAGNOSIS — F411 Generalized anxiety disorder: Secondary | ICD-10-CM

## 2015-07-27 DIAGNOSIS — I10 Essential (primary) hypertension: Secondary | ICD-10-CM | POA: Diagnosis not present

## 2015-07-27 DIAGNOSIS — M810 Age-related osteoporosis without current pathological fracture: Secondary | ICD-10-CM

## 2015-07-27 NOTE — Telephone Encounter (Signed)
Called spoke with pt. She reports her new PCP NP Marda Stalker inquired about her getting pertussis vaccine. She wants to know if Dr. Lake Bells feels this is something she should get. Please advise thanks

## 2015-07-27 NOTE — Progress Notes (Signed)
Subjective:    Patient ID: Melissa Ferrell, female    DOB: 10/13/1935, 80 y.o.   MRN: [id number]  HPI Chief Complaint  Patient presents with  . new pt    new pt get established.    She is new to the practice and here to establish care. Recently took a trip to [place] for her birthday.  Previous medical care: Dr. Maudie Mercury Last CPE and AWV: unknown  States she has some mild anxiety but does not have a mood disorder. States she is not depressed. States she takes alprazolam, sometimes only half a tab, for anxiety and has been taking it PRN for 18 years. Requests that I continue to prescribe this for her as Dr. Maudie Mercury was. States she mainly takes this when she is having a stressful upcoming event.    Has history of Osteoporosis. States she cannot take any of the medications for this. Reports taking vitamin D + Calcium. States she has to be careful to not fall. States she has a cane that she uses when outside and will occasionally use a wheelchair when needing to stand or walk for long periods.   HTN- states she went to htn clinic. States they ruled out kidney disease. States Dr. Stanford Breed is managing this for her. States her blood pressure has been normal at home. She checks it daily and keeps a log.  Sates she was tested for AAA and this was negative.   Has appt with cardiologist this week.  Other providers: Dr. Stanford Breed - cardiologist. Dr. Lake Bells - pulmonologist. Dr. Ubaldo Glassing- dermatologist.   Mental Health History: Denies ever being depressed. Reports having mild anxiety.   Social history: Lives alone in a retirement community.  Son lives in [place], son in [place], daughter went to [employer] from [place]. 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   Former smoking, denies drinking alcohol or drug use   Immunizations: may need Tdap Reviewed allergies, medications, past medical, surgical, and social history.   Review of Systems Pertinent positives and negatives in the history of present  illness.     Objective:   Physical Exam BP 150/90 mmHg  Pulse 68  Ht '5\' 2"'$  (1.575 m)  Wt 191 lb 6.4 oz (86.818 kg)  BMI 35.00 kg/m2  Alert and oriented and in no acute distress. LE with compression stockings in place. Mood and thought content normal and appropriate.       Assessment & Plan:  Essential hypertension  Generalized anxiety disorder  Osteoporosis  She presents with a log of daily blood pressures, heart rates and pulse ox readings. She appears to be very aware of her health. She is aware that her blood pressure is elevated today and per her readings from home, this is not her norm. She will follow up with Dr. Stanford Breed later this week who has been following her blood pressure per patient. Discussed DASH dietary approach.  Discussed that I will temporarily refill her Alprazolam but not until she is due, which per records will be 08/04/2015. Discussed that as long is she is using this for situational anxiety and not misusing or abusing the medication then I will refill it. Discussed that I will need to check the controlled substance database and may need to periodically do drug testing per new guidelines in order to continue to refill this for her. Also discussed BEERS criteria and that this medication is not recommended in her age group due to potential side effects. She is not interested in  stopping the medication and insists she does not take this daily but only in certain situations. She is adamant that she does not have depression or mood issues.  Recommend continuing to do weight bearing exercises and taking vitamin D for bone health. She reports being unable to tolerate "all" medications for osteoporosis. Discussed fall prevention and using cane/walker.  Will need to check CSRS prior to refilling Alprazalam.

## 2015-07-28 NOTE — Progress Notes (Signed)
HPI: FU congestive heart failure and hypertension. Myoview in May of 2011. Perfusion was normal. The study was not gated. Echocardiogram in August 2013 showed normal LV function. We felt that her dyspnea was most likely secondary to COPD. Carotid Dopplers in Sept 2015 showed no hemodynamically significant stenosis. Abdominal ultrasound April 2016 showed no aneurysm. Since I last saw her, She has some dyspnea on exertion but no orthopnea or PND. Chronic mild pedal edema. She has some chest tightness that improves with pro-air. She does not have any other chest pain. No syncope.  Current Outpatient Prescriptions  Medication Sig Dispense Refill  . albuterol (PROAIR HFA) 108 (90 BASE) MCG/ACT inhaler INHALE 2 PUFFS BY MOUTH EVERY 6 HOURS AS NEEDED FOR WHEEZING 8.5 g 6  . ALPRAZolam (XANAX) 0.25 MG tablet TAKE ONE HALF TABLET BY MOUTH AS NEEDED for severe anxiety or panic - not more then every 12 hours. No further refills for 90 days 45 tablet 0  . ARTIFICIAL TEAR OP Apply to eye.    Marland Kitchen aspirin 81 MG tablet Take 81 mg by mouth 2 (two) times daily.     . Calcium Carbonate-Vitamin D 600-400 MG-UNIT per tablet Take 1 tablet by mouth 2 (two) times daily.     . carvedilol (COREG) 12.5 MG tablet TAKE 1 AND 1/2 TABLETS(18.75 MG) BY MOUTH TWICE DAILY WITH A MEAL 270 tablet 0  . Flaxseed, Linseed, (FLAXSEED OIL) 1000 MG CAPS Take 1 capsule by mouth daily at 12 noon.     . folic acid (FOLVITE) 009 MCG tablet Take 400 mcg by mouth daily.    . hydrochlorothiazide (HYDRODIURIL) 25 MG tablet TAKE 1 TABLET BY MOUTH EVERY MORNING 90 tablet 2  . KRILL OIL PO Take 500 mg by mouth.    . Lecithin 1200 MG CAPS Take 1,200 mg by mouth every morning.     Marland Kitchen losartan (COZAAR) 50 MG tablet Take 1 tablet (50 mg total) by mouth 2 (two) times daily. 180 tablet 3  . Magnesium 250 MG TABS Take 1 tablet by mouth every morning.     . Multiple Vitamins-Minerals (CENTRUM SILVER PO) Take 1 tablet by mouth every morning.     .  Multiple Vitamins-Minerals (OCUVITE EYE HEALTH FORMULA PO) Take 1 tablet by mouth daily at 12 noon.     . NON FORMULARY Occuvite    . tiotropium (SPIRIVA) 18 MCG inhalation capsule Place 1 capsule (18 mcg total) into inhaler and inhale daily. 30 capsule 11  . vitamin B-12 (CYANOCOBALAMIN) 500 MCG tablet Take 500 mcg by mouth every morning.      No current facility-administered medications for this visit.     Past Medical History  Diagnosis Date  . HTN (hypertension)   . Hyperlipidemia     intol to statins  . IBS (irritable bowel syndrome)   . Anxiety and depression   . Carotid artery disease (HCC)     hx CAD, dCHF listed in [place] - sees cardiologist, vasc and neurologist  . COPD (chronic obstructive pulmonary disease) ([place])     sees pulmonology  . Osteoporosis   . GERD (gastroesophageal reflux disease)     hx PUD 2ndary to NSAIDs 1975  . Hx of adenomatous colonic polyps   . Hemorrhoids   . Migraine headache   . Rheumatoid arthritis(714.0)     per her report, in remission for many years per her report  . DDD (degenerative disc disease)   . Fracture, spinal   .  Disseminated superficial actinic porokeratosis     bilateral calves  . Varicose veins     hx LE edema  . Memory loss     seen by neurology in 2015 and referred to neuropsych for anxiety  . RHEUMATIC FEVER, HX OF 10/15/2006    Qualifier: Diagnosis of  By: Jenny Reichmann MD, Hunt Oris     Past Surgical History  Procedure Laterality Date  . Thyroglossal duct cyst      1943, 1944, 1973  . Chronically infected lymph node removed  1973    Social History   Social History  . Marital Status: Divorced    Spouse Name: N/A  . Number of Children: 3  . Years of Education: N/A   Occupational History  . Retired-homemaker    Social History Main Topics  . Smoking status: Former Smoker -- 3.00 packs/day for 30 years    Types: Cigarettes    Quit date: 01/17/1985  . Smokeless tobacco: Never Used     Comment: previous 80 pack year  history  . Alcohol Use: No  . Drug Use: No  . Sexual Activity: No   Other Topics Concern  . Not on file   Social History Narrative   Work or School: volunteers at [employer] Situation: lives at [place]:       Lifestyle: no regular exercise; diet is ok          Family History  Problem Relation Age of Onset  . Heart disease Mother     MI  . Hypertension Mother   . Heart attack Mother   . Stomach cancer Brother   . Cancer Brother   . Heart disease Brother   . Hypertension Brother   . Heart attack Brother   . AAA (abdominal aortic aneurysm) Brother   . Aneurysm Brother     brain  . Melanoma Brother   . Colon cancer Brother   . Heart disease Brother   . Alcohol abuse    . Heart attack Brother 95  . Allergies Sister   . Hypertension Sister   . Breast cancer Maternal Grandmother   . Brain cancer Father   . Cancer Father     ROS: no fevers or chills, productive cough, hemoptysis, dysphasia, odynophagia, melena, hematochezia, dysuria, hematuria, rash, seizure activity, orthopnea, PND, claudication. Remaining systems are negative.  Physical Exam: Well-developed well-nourished in no acute distress.  Skin is warm and dry.  HEENT is normal.  Neck is supple.  Chest Mild wheeze with forced expiration. Cardiovascular exam is regular rate and rhythm.  Abdominal exam nontender or distended. No masses palpated. Extremities show trace edema. neuro grossly intact  ECG- Sinus bradycardia at a rate of 58. Normal axis. Cannot rule out prior septal infarct.  Assessment and plan  1 hyperlipidemia-continue diet. Intolerant to statins.  2 hypertension-blood pressure controlled. Continue present medications. Potassium and renal function monitored by primary care.  3 cerebrovascular disease-mild to most recent carotid Dopplers.  Kirk Ruths, MD

## 2015-07-28 NOTE — Telephone Encounter (Signed)
I'm OK with that

## 2015-07-28 NOTE — Telephone Encounter (Signed)
lmtcb X1 for pt to relay recs.   

## 2015-07-29 NOTE — Telephone Encounter (Signed)
Spoke with pt,aware of recs.  Nothing further needed.  

## 2015-07-30 ENCOUNTER — Encounter: Payer: Self-pay | Admitting: Cardiology

## 2015-07-30 ENCOUNTER — Ambulatory Visit (INDEPENDENT_AMBULATORY_CARE_PROVIDER_SITE_OTHER): Payer: Medicare Other | Admitting: Cardiology

## 2015-07-30 VITALS — BP 122/76 | HR 58 | Ht 64.0 in | Wt 192.0 lb

## 2015-07-30 DIAGNOSIS — E785 Hyperlipidemia, unspecified: Secondary | ICD-10-CM

## 2015-07-30 DIAGNOSIS — I10 Essential (primary) hypertension: Secondary | ICD-10-CM | POA: Diagnosis not present

## 2015-07-30 DIAGNOSIS — I6523 Occlusion and stenosis of bilateral carotid arteries: Secondary | ICD-10-CM

## 2015-07-30 MED ORDER — LOSARTAN POTASSIUM 50 MG PO TABS: 50.0000 mg | ORAL_TABLET | Freq: Two times a day (BID) | ORAL | Status: DC

## 2015-07-30 NOTE — Patient Instructions (Signed)
Your physician wants you to follow-up in: 6 MONTHS WITH DR CRENSHAW You will receive a reminder letter in the mail two months in advance. If you don't receive a letter, please call our office to schedule the follow-up appointment.   If you need a refill on your cardiac medications before your next appointment, please call your pharmacy.  

## 2015-08-03 ENCOUNTER — Ambulatory Visit (INDEPENDENT_AMBULATORY_CARE_PROVIDER_SITE_OTHER): Payer: Medicare Other | Admitting: Family Medicine

## 2015-08-03 ENCOUNTER — Encounter: Payer: Self-pay | Admitting: Family Medicine

## 2015-08-03 VITALS — BP 138/80 | HR 60 | Ht 62.25 in | Wt 193.0 lb

## 2015-08-03 DIAGNOSIS — Z1211 Encounter for screening for malignant neoplasm of colon: Secondary | ICD-10-CM

## 2015-08-03 DIAGNOSIS — F39 Unspecified mood [affective] disorder: Secondary | ICD-10-CM

## 2015-08-03 DIAGNOSIS — R5383 Other fatigue: Secondary | ICD-10-CM | POA: Diagnosis not present

## 2015-08-03 DIAGNOSIS — J439 Emphysema, unspecified: Secondary | ICD-10-CM

## 2015-08-03 DIAGNOSIS — F411 Generalized anxiety disorder: Secondary | ICD-10-CM | POA: Diagnosis not present

## 2015-08-03 DIAGNOSIS — M810 Age-related osteoporosis without current pathological fracture: Secondary | ICD-10-CM

## 2015-08-03 DIAGNOSIS — Z0000 Encounter for general adult medical examination without abnormal findings: Secondary | ICD-10-CM

## 2015-08-03 DIAGNOSIS — E785 Hyperlipidemia, unspecified: Secondary | ICD-10-CM | POA: Diagnosis not present

## 2015-08-03 DIAGNOSIS — I10 Essential (primary) hypertension: Secondary | ICD-10-CM

## 2015-08-03 MED ORDER — ALPRAZOLAM 0.25 MG PO TABS: ORAL_TABLET | ORAL | Status: DC

## 2015-08-03 NOTE — Patient Instructions (Addendum)
MEDICARE PREVENTATIVE SERVICES (FEMALE) AND PERSONALIZED PLAN for Melissa Ferrell August 03, 2015  CONDITIONS OR RISKS IDENTIFIED TODAY: Fall risk Fracture risk   SPECIFIC RECOMMENDATIONS: Continue taking vitamin D, calcium and doing weight bearing exercises.   Influenza vaccine: this fall Pneumococcal vaccine: up to date Shingles vaccine: Contraindicated Tdap vaccine: Prescription given today Colonoscopy: stool cards given  Mammogram: N/A   Aspirin '81mg'$  nightly for heart health Exercise as tolerated. Continue eating a healthy diet.  Return in 6 months as needed pending labs.     GENERAL RECOMMENDATIONS FOR GOOD HEALTH:  Supplements:  . Take a daily baby Aspirin '81mg'$  at bedtime for heart health unless you have a history of gastrointestinal bleed, allergy to aspirin, or are already taking higher dose Aspirin or other antiplatelet or blood thinner medication.   . Consume 1200 mg of Calcium daily through dietary calcium or supplement if you are female age 78 or older, or men 83 and older.   Men aged 66-70 should consume 1000 mg of Calcium daily. . Take 600 IU of Vitamin D daily.  Take 800 IU of Calcium daily if you are older than age 19.  . Take a general multivitamin daily.   Healthy diet: Eat a variety of foods, including fruits, vegetables, vegetable protein such as beans, lentils, tofu, and grains, such as rice.  Limit meat or animal protein, but if you eat meat, choose leans cuts such as chicken, fish, or [place].  Drink plenty of water daily.  Decrease saturated fat in the diet, avoid lots of red meat, processed foods, sweets, fast foods, and fried foods.  Limit salt and caffeine intake.  Exercise: Aerobic exercise helps maintain good heart health. Weight bearing exercise helps keep bones and muscles working strong.  We recommend at least 30-40 minutes of exercise most days of the week.   Fall prevention: Falls are the leading cause of injuries, accidents, and accidental  deaths in people over the age of 52. Falling is a real threat to your ability to live on your own.  Causes include poor eyesight or poor hearing, illness, poor lighting, throw rugs, clutter in your home, and medication side effects causing dizziness or balance problems.  Such medications can include medications for depression, sleep problems, high blood pressure, diabetes, and heart conditions.   PREVENTION  Be sure your home is as safe as possible. Here are some tips:  Wear shoes with non-skid soles (not house slippers).   Be sure your home and outside area are well lit.   Use night lights throughout your house, including hallways and stairways.   Remove clutter and clean up spills on floors and walkways.   Remove throw rugs or fasten them to the floor with carpet tape. Tack down carpet edges.   Do not place electrical cords across pathways.   Install grab bars in your bathtub, shower, and toilet area. Towel bars should not be used as a grab bar.   Install handrails on both sides of stairways.   Do not climb on stools or stepladders. Get someone else to help with jobs that require climbing.   Do not wax your floors at all, or use a non-skid wax.   Repair uneven or unsafe sidewalks, walkways or stairs.   Keep frequently used items within reach.   Be aware of pets so you do not trip.  Get regular check-ups from your doctor, and take good care of yourself:  Have your eyes checked every year for vision changes, cataracts, glaucoma,  and other eye problems. Wear eyeglasses as directed.   Have your hearing checked every 2 years, or anytime you or others think that you cannot hear well. Use hearing aids as directed.   See your caregiver if you have foot pain or corns. Sore feet can contribute to falls.   Let your caregiver know if a medicine is making you feel dizzy or making you lose your balance.   Use a cane, walker, or wheelchair as directed. Use walker or wheelchair brakes when  getting in and out.   When you get up from bed, sit on the side of the bed for 1 to 2 minutes before you stand up. This will give your blood pressure time to adjust, and you will feel less dizzy.   If you need to go to the bathroom often, consider using a bedside commode.  Disease prevention:  If you smoke or chew tobacco, find out from your caregiver how to quit. It can literally save your life, no matter how long you have been a tobacco user. If you do not use tobacco, never begin. Medicare does cover some smoking cessation counseling.  Maintain a healthy diet and normal weight. Increased weight leads to problems with blood pressure and diabetes. We check your height, weight, and BMI as part of your yearly visit.  The Body Mass Index or BMI is a way of measuring how much of your body is fat. Having a BMI above 27 increases the risk of heart disease, diabetes, hypertension, stroke and other problems related to obesity. Your caregiver can help determine your BMI and based on it develop an exercise and dietary program to help you achieve or maintain this important measurement at a healthful level.  High blood pressure causes heart and blood vessel problems.  Persistent high blood pressure should be treated with medicine if weight loss and exercise do not work.  We check your blood pressure as part of your yearly visit.  Avoid drinking alcohol in excess (more than two drinks per day).  Avoid use of street drugs. Do not share needles with anyone. Ask for professional help if you need assistance or instructions on stopping the use of alcohol, cigarettes, and/or drugs.  Brush your teeth twice a day with fluoride toothpaste, and floss once a day. Good oral hygiene prevents tooth decay and gum disease. The problems can be painful, unattractive, and can cause other health problems. Visit your dentist for a routine oral and dental checkup and preventive care every 6-12 months.   See your eye doctor yearly  for routine screening for things like glaucoma.  Look at your skin regularly.  Use a mirror to look at your back. Notify your caregivers of changes in moles, especially if there are changes in shapes, colors, a size larger than a pencil eraser, an irregular border, or development of new moles.  Safety:  Use seatbelts 100% of the time, whether driving or as a passenger.  Use safety devices such as hearing protection if you work in environments with loud noise or significant background noise.  Use safety glasses when doing any work that could send debris in to the eyes.  Use a helmet if you ride a bike or motorcycle.  Use appropriate safety gear for contact sports.  Talk to your caregiver about gun safety.  Use sunscreen with a SPF (or skin protection factor) of 15 or greater.  Lighter skinned people are at a greater risk of skin cancer. Don't forget to also wear  sunglasses in order to protect your eyes from too much damaging sunlight. Damaging sunlight can accelerate cataract formation.   If you have multiple sexual partners, or if you are not in a monogamous relationship, practice safe sex. Use condoms. Condoms are used to help reduce the spread of sexually transmitted infections (or STIs).  Consider an HIV test if you have never been tested.  Consider routine screening for STIs if you have multiple sexual partners.   Keep carbon monoxide and smoke detectors in your home functioning at all times. Change the batteries every 6 months or use a model that plugs into the wall or is hard wired in.   END OF LIFE PLANNING/ADVANCED DIRECTIVES Advance health-care planning is deciding the kind of care you want at the end of life. While alert competent adults are able to exercise their rights to make health care and financial decisions, problems arise when an individual becomes unconscious, incapacitated, or otherwise unable to communicate or make such decisions. Advance health care directives are the legal  documents in which you give written instructions about your choices limited, aggressive or palliative care if, in the future, you cannot speak for yourself.  Advanced directives include the following: [place] allows you to appoint someone to act as your health care agent to make health care decisions for you should it be determined by your health care provider that you are no longer able to make these decisions for yourself.  A Living Will is a legal document in which you can declare that under certain conditions you desire your life not be prolonged by extraordinary or artificial means during your last illness or when you are near death. We can provide you with sample advanced directives, you can get an attorney to prepare these for you, or you can visit [place] Secretary of State's website for additional information and resources at [email or web address]  Further, I recommend you have an attorney prepare a Will and Durable Power of Attorney if you haven't done so already.  Please get [place] a copy of your health care Advanced Directives.   PREVENTATIV E CARE RECOMMENDATIONS:  Vaccinations: We recommend the following vaccinations as part of your preventative care:  Pneumococcal vaccine is recommended to protect against certain types of pneumonia.  This is normally recommended for adults age 7 or older once, or up to every 5 years for those at high risk.  The vaccine is also recommended for adults younger than 78 years old with certain underlying conditions that make them high risk for pneumonia.  Influenza vaccine is recommended to protect against seasonal influenza or "the flu." Influenza is a serious disease that can lead to hospitalization and sometimes even death. Traditional flu vaccines (called trivalent vaccines) are made to protect against three flu viruses; an influenza A (H1N1) virus, an influenza A (H3N2) virus, and an influenza B virus. In addition, there  are flu vaccines made to protect against four flu viruses (called "quadrivalent" vaccines). These vaccines protect against the same viruses as the trivalent vaccine and an additional B virus.  We recommend the high dose influenza vaccine to those 65 years and older.  Hepatitis B vaccine to protect against a form of infection of the liver by a virus acquired from blood or body fluids, particularly for high risk groups.  Td or Tdap vaccine to protect against Tetanus, diphtheria and pertussis which can be very serious.  These diseases are caused by bacteria.  Diphtheria and pertussis are spread from  person to person through coughing or sneezing.  Tetanus enters the body through cuts, scratches, or wounds.  Tetanus (Lockjaw) causes painful muscle tightening and stiffness, usually all over the body.  Diphtheria can cause a thick coating to form in the back of the throat.  It can lead to breathing problems, paralysis, heart failure, and death.  Pertussis (Whooping Cough) causes severe coughing spells, which can cause difficulty breathing, vomiting and disturbed sleep.  Td or Tdap is usually given every 10 years.  Shingles vaccine to protect against Varicella Zoster if you are older than age 96, or younger than 78 years old with certain underlying illness.    Cancer Screening: Most routine colon cancer screening begins at the age of 21.  Subsequent colonoscopies are performed either every 5-10 years for normal screening, or every 2-5 years for higher risks patients, up until age 96 years of age. Annual screening is done with easy to use take-home tests to check for hidden blood in the stool called hemoccult tests.  Sigmoidoscopy or colonoscopy can detect the earliest forms of colon cancer and is life saving. These tests use a small camera at the end of a tube to directly examine the colon.   Pelvic Exam and Pap Smear: Pelvic exams and pap smears are performed routinely to evaluate for abnormalities as well as  cancers including cervical and vaginal cancers.  This is generally performed every 2-3 years for most women, or more frequently for higher risk patients.  Mammograms: Mammograms are used to screen for breast cancer.  Medicare covers baseline screening once from ages 64-13 years old, but will cover mammograms yearly for those 40 years and older.  In accordance with other guidelines, you may not need a mammogram every year though.  The decision on how frequently you need a mammogram should be discussed with you medical provider.    Osteoporosis Screening: Screening for osteoporosis usually begins at age 9 for women, and can be done as frequent as every 2 years.  However, women or men with higher risk of osteoporosis may be screened earlier than age 44.  Osteoporosis or low bone mass is diminished bone strength from alterations in bone architecture leading to bone fragility and increased fracture risk.     Cardiovascular Screening: Fat and cholesterol leaves deposits in your arteries that can block them. This causes heart disease and vessel disease elsewhere in your body.  If your cholesterol is found to be high, or if you have heart disease or certain other medical conditions, then you may need to have your cholesterol monitored frequently and be treated with medication. Cardiovascular screening in the form of lab tests for cholesterol, HDL and triglycerides can be done every 5 years.  A screening electrocardiogram can be done as part of the Welcome to Medicare physical.  Diabetes Screening: Diabetes screening can be done at least every 3 years for those with risk factors,  or every 6-57month for prediabetic patients.  Screening includes fasting blood sugar test or glucose tolerance test.  Risk factors include hypertension, dyslipidemia, obesity, previously abnormal glucose tests, family history of diabetes, age 1162years or older, and history of gestations diabetes.   AAA (abdominal aortic aneurysm)  Screening: Medicare allows for a one time ultrasound to screen for abdominal aortic aneurysm if done as a referral as part of the Welcome to Medicare exam.  Men eligible for this screening include those men between age 1162770years of age who have smoked at least 100 cigarettes in his  lifetime and/or has a family history of AAA.  HIV Screening:  Medicare allows for yearly screening for patients at high risk for contracting HIV disease.

## 2015-08-03 NOTE — Progress Notes (Signed)
Melissa Ferrell is a 80 y.o. female who presents for annual wellness visit and follow-up on chronic medical conditions.  She has the following concerns:  Cholesterol- fasting today. Cannot tolerate statins. Is watching diet.  Osteoporosis - cannot tolerate medications, taking calcium and D and exercising.  HTN- brought in records and readings have been <140/80 consistently  History of COPD and has gone to Pulmonary rehab at [facility] for COPD in past. Reports she is being followed by Dr. Lake Ferrell for this closely.  Complains of feeling tired, ongoing and states "no one can figure out why I am tired". Denies any recent blood loss, chest pain, worsening DOE.    Immunization History  Administered Date(s) Administered  . Influenza Whole 10/11/2006, 10/03/2010, 09/24/2011, 09/18/2012  . Influenza, High Dose Seasonal PF 08/29/2014  . Influenza-Unspecified 09/14/2013  . Pneumococcal Conjugate-13 05/08/2013  . Pneumococcal Polysaccharide-23 10/11/2006  . Td 09/18/2006   Last Pap smear: many years. Menopause at age 66.  Last mammogram: 3 years ago. Normal  Last colonoscopy: 2008.  Last DEXA: last year  Broken bone last year "by just leaning over".   Dentist: cleaning due in 3 weeks.  Ophtho: due in October  Exercise: does Wii fit.   Other doctors caring for patient include: Dr. Lake Ferrell- pulmonologist every 6 months. Dr. Stanford Ferrell- cardiologist. Dr. Ellie Ferrell- [facility]. Dr. Ubaldo Ferrell  Dermatologist. Dr. Scot Ferrell- vascular surgeon   Depression screen:  See questionnaire below.  Depression screen [facility] 2/9 08/03/2015 03/05/2015 11/14/2013 05/15/2012 05/15/2012  Decreased Interest 0 0 0 0 -  Down, Depressed, Hopeless 0 0 0 1 1  PHQ - 2 Score 0 0 0 1 1    Fall Risk Screen: see questionnaire below. Fall Risk  08/03/2015 03/05/2015 11/14/2013 05/15/2012  Falls in the past year? No No No Yes  Number falls in past yr: - - - 2 or more  Risk Factor Category  - - - High Fall Risk  Risk for fall due to : - - -  History of fall(s);Impaired balance/gait;Medication side effect;Other (Comment)  Risk for fall due to (comments): - - - knee weakness    ADL screen:  See questionnaire below Functional Status Survey: Is the patient deaf or have difficulty hearing?: No Does the patient have difficulty seeing, even when wearing glasses/contacts?: Yes (cataracts) Does the patient have difficulty concentrating, remembering, or making decisions?: No Does the patient have difficulty walking or climbing stairs?: No Does the patient have difficulty dressing or bathing?: No Does the patient have difficulty doing errands alone such as visiting a doctor's office or shopping?: No   End of Life Discussion:  Patient has a living will and medical power of attorney. She will bring this in for [place] to scan.   Review of Systems Constitutional: -fever, -chills, -sweats, -unexpected weight change, -anorexia, +fatigue Allergy: -sneezing, -itching, -congestion Dermatology: denies changing moles, rash, lumps, new worrisome lesions ENT: -runny nose, -ear pain, -sore throat, -hoarseness, -sinus pain, -teeth pain, -tinnitus, +hearing loss left ear, chronic, -epistaxis Cardiology:  -chest pain, -palpitations, -edema, -orthopnea, -paroxysmal nocturnal dyspnea Respiratory: -cough, -shortness of breath, +dyspnea on exertion chronic, -wheezing, -hemoptysis Gastroenterology: -abdominal pain, -nausea, -vomiting, -diarrhea, -constipation, -blood in stool, -changes in bowel movement, -dysphagia Hematology: -bleeding,  + bruising Musculoskeletal: -arthralgias, -myalgias, -joint swelling, +back pain, +neck pain, -cramping, -gait changes Ophthalmology: -vision changes, -eye redness, -itching, -discharge Urology: -dysuria, -difficulty urinating, -hematuria, -urinary frequency, -urgency, incontinence Neurology: -headache, -weakness, -tingling, -numbness, -speech abnormality, + mild memory loss saw Dr. Loretta Ferrell, family history in mother  and sister,  -falls, -dizziness Psychology:  -depressed mood, -agitation, -sleep problems    PHYSICAL EXAM:  BP 138/80 mmHg  Pulse 60  Ht 5' 2.25" (1.581 m)  Wt 193 lb (87.544 kg)  BMI 35.02 kg/m2  General Appearance: Alert, cooperative, no distress, appears stated age Head: Normocephalic, without obvious abnormality, atraumatic Eyes: PERRL, conjunctiva/corneas clear, EOM's intact Ears: Normal TM's and external ear canals Nose: Nares normal, mucosa normal, no drainage or sinus tenderness Throat: Lips, mucosa, and tongue normal; teeth and gums normal Neck: Supple, no lymphadenopathy; thyroid: no enlargement/tenderness/nodules Back: Spine nontender, curvature noted to midspine, ROM normal, no CVA tenderness Lungs: Clear to auscultation bilaterally without wheezes, rales or ronchi; respirations unlabored Chest Wall: No tenderness or deformity Heart: Regular rate and rhythm, S1 and S2 normal, no murmur, rub or gallop Breast Exam: Refused.  Abdomen: Soft, non-tender, nondistended, normoactive bowel sounds, no masses, no hepatosplenomegaly Genitalia: refused   Rectal: refused. Stool cards sent home. Extremities: No clubbing, cyanosis or edema Pulses: 2+ and symmetric all extremities Skin: Skin color, texture, turgor normal, no rashes or lesions Lymph nodes: Cervical, supraclavicular, and axillary nodes normal Neurologic: CNII-XII intact, normal strength, sensation and gait; reflexes 2+ and symmetric throughout Psych: Normal mood, affect, hygiene and grooming.  ASSESSMENT/PLAN: Encounter for Medicare annual wellness exam  Essential hypertension - Plan: CBC with Differential/Platelet, Comprehensive metabolic panel  Osteoporosis - Plan: TSH  Generalized anxiety disorder - Plan: TSH  Pulmonary emphysema, unspecified emphysema type (HCC)  Hyperlipemia - Plan: Lipid panel  Other fatigue - Plan: TSH  Special screening for malignant neoplasms, colon - Plan: POCT occult blood  stool  Depression and anxiety - Plan: ALPRAZolam (XANAX) 0.25 MG tablet  She verbalizes that quality of life is more important to her than quantity and states she would not do anything about a breast mass or any other mass so she declines a breast, pelvic or rectal exam today.  Osteoporosis: Discussed continue taking calcium and vitamin D, she cannot tolerate medication for osteoporosis. Also continue weight bearing exercises. Last DEXA in 2016, will repeat next year. Discussed fall prevention.  HTN: She is eating a low sodium diet and I recommend continuing this for HTN control. Will check renal function.  COPD and emphysema: continue on current medications and follow up with pulmonologist. She is not in any distress today.  Tdap prescription given.  Stool cards sent home with patient.  Hyperlipidemia: Will check lipids, she is fasting. Intolerant to statins. Continue diet control.  Refilled low dose alprazolam with strict instructions to use caution when taking this medication. Discussed BEERS criteria and that I will want to wean her off of this at some point. She appears to take this only as needed and not daily.  She has advanced directives and plans to bring these by for [place] to scan into system.   Discussed monthly self breast exams and yearly mammograms; at least 30 minutes of aerobic activity at least 5 days/week and weight-bearing exercise 2x/week; proper sunscreen use reviewed; healthy diet, including goals of calcium and vitamin D intake and alcohol recommendations (less than or equal to 1 drink/day) reviewed; regular seatbelt use; changing batteries in smoke detectors.  Immunization recommendations discussed.  Colonoscopy recommendations reviewed.   Medicare Attestation I have personally reviewed: The patient's medical and social history Their use of alcohol, tobacco or illicit drugs Their current medications and supplements The patient's functional ability including ADLs,fall risks,  home safety risks, cognitive, and hearing and visual impairment Diet and physical activities  Evidence for depression or mood disorders  The patient's weight, height, and BMI have been recorded in the chart.  I have made referrals, counseling, and provided education to the patient based on review of the above and I have provided the patient with a written personalized care plan for preventive services.     Harland Dingwall, NP   08/03/2015

## 2015-08-04 LAB — COMPREHENSIVE METABOLIC PANEL
ALK PHOS: 42 U/L (ref 33–130)
ALT: 17 U/L (ref 6–29)
AST: 22 U/L (ref 10–35)
Albumin: 4 g/dL (ref 3.6–5.1)
BUN: 16 mg/dL (ref 7–25)
CALCIUM: 9.9 mg/dL (ref 8.6–10.4)
CO2: 28 mmol/L (ref 20–31)
CREATININE: 1.11 mg/dL — AB (ref 0.60–0.93)
Chloride: 97 mmol/L — ABNORMAL LOW (ref 98–110)
Glucose, Bld: 93 mg/dL (ref 65–99)
Potassium: 3.8 mmol/L (ref 3.5–5.3)
Sodium: 135 mmol/L (ref 135–146)
TOTAL PROTEIN: 6.5 g/dL (ref 6.1–8.1)
Total Bilirubin: 0.8 mg/dL (ref 0.2–1.2)

## 2015-08-04 LAB — CBC WITH DIFFERENTIAL/PLATELET
BASOS ABS: 80 {cells}/uL (ref 0–200)
Basophils Relative: 1 %
Eosinophils Absolute: 240 cells/uL (ref 15–500)
Eosinophils Relative: 3 %
HEMATOCRIT: 39.4 % (ref 35.0–45.0)
Hemoglobin: 13.5 g/dL (ref 11.7–15.5)
LYMPHS ABS: 1760 {cells}/uL (ref 850–3900)
Lymphocytes Relative: 22 %
MCH: 31.5 pg (ref 27.0–33.0)
MCHC: 34.3 g/dL (ref 32.0–36.0)
MCV: 91.8 fL (ref 80.0–100.0)
MONO ABS: 640 {cells}/uL (ref 200–950)
MPV: 10.6 fL (ref 7.5–12.5)
Monocytes Relative: 8 %
NEUTROS ABS: 5280 {cells}/uL (ref 1500–7800)
NEUTROS PCT: 66 %
Platelets: 294 10*3/uL (ref 140–400)
RBC: 4.29 MIL/uL (ref 3.80–5.10)
RDW: 13.9 % (ref 11.0–15.0)
WBC: 8 10*3/uL (ref 4.0–10.5)

## 2015-08-04 LAB — LIPID PANEL
CHOLESTEROL: 212 mg/dL — AB (ref 125–200)
HDL: 54 mg/dL (ref >=46)
LDL Cholesterol: 136 mg/dL — ABNORMAL HIGH (ref <130)
Total CHOL/HDL Ratio: 3.9 Ratio (ref <=5.0)
Triglycerides: 110 mg/dL (ref <150)
VLDL: 22 mg/dL (ref <30)

## 2015-08-04 LAB — TSH: TSH: 1.63 mIU/L

## 2015-08-05 ENCOUNTER — Other Ambulatory Visit (INDEPENDENT_AMBULATORY_CARE_PROVIDER_SITE_OTHER): Payer: Medicare Other

## 2015-08-05 ENCOUNTER — Telehealth: Payer: Self-pay | Admitting: Family Medicine

## 2015-08-05 DIAGNOSIS — D62 Acute posthemorrhagic anemia: Secondary | ICD-10-CM

## 2015-08-05 DIAGNOSIS — Z1211 Encounter for screening for malignant neoplasm of colon: Secondary | ICD-10-CM | POA: Diagnosis not present

## 2015-08-05 LAB — HEMOCCULT GUIAC POC 1CARD (OFFICE)
Card #2 Fecal Occult Blod, POC: NEGATIVE
Card #3 Fecal Occult Blood, POC: NEGATIVE
FECAL OCCULT BLD: NEGATIVE

## 2015-08-05 NOTE — Telephone Encounter (Signed)
Pt came in and dropped off declaration of a desire for natural death paper. Sending back for review and then will scan in pt's chart. Pt stated that she will get living will and power of attorney sent to [place] as well.

## 2015-08-19 ENCOUNTER — Encounter: Payer: Self-pay | Admitting: Family Medicine

## 2015-09-07 DIAGNOSIS — Z23 Encounter for immunization: Secondary | ICD-10-CM | POA: Diagnosis not present

## 2015-10-05 ENCOUNTER — Other Ambulatory Visit: Payer: Self-pay | Admitting: Cardiology

## 2015-10-06 ENCOUNTER — Other Ambulatory Visit: Payer: Self-pay | Admitting: Cardiology

## 2015-10-06 NOTE — Telephone Encounter (Signed)
Rx has been sent to the pharmacy electronically. ° °

## 2015-10-07 ENCOUNTER — Other Ambulatory Visit: Payer: Self-pay | Admitting: Cardiology

## 2015-11-17 DIAGNOSIS — H52203 Unspecified astigmatism, bilateral: Secondary | ICD-10-CM | POA: Diagnosis not present

## 2015-11-17 DIAGNOSIS — H2513 Age-related nuclear cataract, bilateral: Secondary | ICD-10-CM | POA: Diagnosis not present

## 2015-11-30 ENCOUNTER — Encounter: Payer: Self-pay | Admitting: Pulmonary Disease

## 2015-11-30 ENCOUNTER — Ambulatory Visit (INDEPENDENT_AMBULATORY_CARE_PROVIDER_SITE_OTHER): Payer: Medicare Other | Admitting: Pulmonary Disease

## 2015-11-30 DIAGNOSIS — I6523 Occlusion and stenosis of bilateral carotid arteries: Secondary | ICD-10-CM

## 2015-11-30 DIAGNOSIS — J432 Centrilobular emphysema: Secondary | ICD-10-CM | POA: Diagnosis not present

## 2015-11-30 MED ORDER — DOXYCYCLINE HYCLATE 100 MG PO TABS: 100.0000 mg | ORAL_TABLET | Freq: Two times a day (BID) | ORAL | 0 refills | Status: DC

## 2015-11-30 MED ORDER — TIOTROPIUM BROMIDE MONOHYDRATE 18 MCG IN CAPS: 18.0000 ug | ORAL_CAPSULE | Freq: Every day | RESPIRATORY_TRACT | 11 refills | Status: DC

## 2015-11-30 NOTE — Patient Instructions (Signed)
Keep taking Spiriva and pro-air as you're doing Use the doxycycline if increasing chest congestion, mucus production or shortness of breath We will see you back in 4-6 months or sooner if

## 2015-11-30 NOTE — Assessment & Plan Note (Signed)
This has been a stable interval for Melissa Ferrell. Today we spent a significant amount of time talking about various formulations of medicines because she's concerned that the form of Spiriva she uses is going to go off of formulary soon. It turns out she has an allergy to medicines delivered in the Respimat format so were not able to change over to anything that way.  Plan: I think the best plan moving forward is to continue using Spiriva HandiHaler as long as it's on the market. Keep using albuterol as needed Flu shot up-to-date Given prescription for doxycycline in the event of an exacerbation Follow-up 4-6 months  Greater than 50% of today's 27 minute visit face-to-face

## 2015-11-30 NOTE — Progress Notes (Signed)
Subjective:    Patient ID: Melissa Ferrell, female    DOB: 10-18-1935, 80 y.o.   MRN: [id number]  Synopsis: COPD followed by Dr. Gwenette Ferrell prior to 06/2014; she has a history of rheumatoid arthritis. She previously smoked 3 ppd and quit in 1986.  PFT's 09/12/2011:  FEV1  1.37 (79%) ratio 47, FEV1 increased 14% with BD, +++ airtrapping, DLCO 60% On spiriva, arcapta added 11/2011 as a trial with ++response.  Pt wanted to take arcapta alone and stop spiriva  >> saw worsening breathing with arcapta alone, so restarted spiriva  Worsening on spiriva/arcapta, no coverage for stiolto; Breo did not help. Trial of breo 03/2014   August 2016 pulmonary function testing> ratio 58%, FEV1 1.34 L (69% predicted, 20% change with broncho-dilator), total lung capacity 4.87 L (99% predicted), DLCO 15.96 (69% predicted) sees.   HPI Chief Complaint  Patient presents with  . Follow-up    pt states she is doing well, no complaints today.  states she is at her baseline.     Melissa Ferrell says that proAir is not covered by her plan thi syear.  She wants to refill another one prior to the end of the year.  She doesn't want to change to ventolin.  She had to take the doxycycline once since the last visit.  She has done really well this year.    She would like to refill her Spiriva again today.    She had a flu shot in August.   Past Medical History:  Diagnosis Date  . Anxiety and depression   . Carotid artery disease (HCC)    hx CAD, dCHF listed in [place] - sees cardiologist, vasc and neurologist  . COPD (chronic obstructive pulmonary disease) ([place])    sees pulmonology  . DDD (degenerative disc disease)   . Disseminated superficial actinic porokeratosis    bilateral calves  . Fracture, spinal   . GERD (gastroesophageal reflux disease)    hx PUD 2ndary to NSAIDs 1975  . Hemorrhoids   . HTN (hypertension)   . Hx of adenomatous colonic polyps   . Hyperlipidemia    intol to statins  . IBS (irritable bowel  syndrome)   . Memory loss    seen by neurology in 2015 and referred to neuropsych for anxiety  . Migraine headache   . Osteoporosis   . RHEUMATIC FEVER, HX OF 10/15/2006   Qualifier: Diagnosis of  By: Melissa Reichmann MD, Melissa Ferrell   . Rheumatoid arthritis(714.0)    per her report, in remission for many years per her report  . Varicose veins    hx LE edema      Review of Systems  Constitutional: Negative for diaphoresis, fatigue and fever.  HENT: Negative for rhinorrhea, sinus pressure and sneezing.   Respiratory: Negative for cough, shortness of breath and wheezing.   Cardiovascular: Negative for chest pain, palpitations and leg swelling.       Objective:   Physical Exam Vitals:   11/30/15 1202  BP: 132/76  Pulse: 64  SpO2: 96%  Weight: 191 lb 3.2 oz (86.7 kg)  Height: '5\' 3"'$  (1.6 m)   RA  Gen: well appearing HENT: OP clear,  neck supple PULM: CTA B, normal percussion CV: RRR, no mgr, trace edema GI: BS+, soft, nontender Derm: no cyanosis or rash Psyche: normal mood and affect      Assessment & Plan:  COPD (chronic obstructive pulmonary disease) with emphysema ([place]) This has been a stable interval for Melissa Ferrell. Today we spent  a significant amount of time talking about various formulations of medicines because she's concerned that the form of Spiriva she uses is going to go off of formulary soon. It turns out she has an allergy to medicines delivered in the Respimat format so were not able to change over to anything that Ferrell.  Plan: I think the best plan moving forward is to continue using Spiriva HandiHaler as long as it's on the market. Keep using albuterol as needed Flu shot up-to-date Given prescription for doxycycline in the event of an exacerbation Follow-up 4-6 months  Greater than 50% of today's 27 minute visit face-to-face    Current Outpatient Prescriptions:  .  albuterol (PROAIR HFA) 108 (90 BASE) MCG/ACT inhaler, INHALE 2 PUFFS BY MOUTH EVERY 6 HOURS AS  NEEDED FOR WHEEZING, Disp: 8.5 g, Rfl: 6 .  ALPRAZolam (XANAX) 0.25 MG tablet, TAKE ONE HALF TABLET BY MOUTH AS NEEDED for severe anxiety or panic - not more then every 12 hours., Disp: 20 tablet, Rfl: 2 .  ARTIFICIAL TEAR OP, Apply to eye., Disp: , Rfl:  .  aspirin 81 MG tablet, Take 81 mg by mouth 2 (two) times daily. , Disp: , Rfl:  .  Calcium Carbonate-Vitamin D 600-400 MG-UNIT per tablet, Take 1 tablet by mouth 2 (two) times daily. , Disp: , Rfl:  .  carvedilol (COREG) 12.5 MG tablet, Take 1.5 tablets (18.75 mg total) by mouth 2 (two) times daily with a meal., Disp: 270 tablet, Rfl: 3 .  Flaxseed, Linseed, (FLAXSEED OIL) 1000 MG CAPS, Take 1 capsule by mouth daily at 12 noon. , Disp: , Rfl:  .  folic acid (FOLVITE) 355 MCG tablet, Take 400 mcg by mouth daily., Disp: , Rfl:  .  hydrochlorothiazide (HYDRODIURIL) 25 MG tablet, TAKE 1 TABLET BY MOUTH EVERY MORNING, Disp: 90 tablet, Rfl: 2 .  KRILL OIL PO, Take 500 mg by mouth., Disp: , Rfl:  .  Lecithin 1200 MG CAPS, Take 1,200 mg by mouth every morning. , Disp: , Rfl:  .  losartan (COZAAR) 50 MG tablet, Take 1 tablet (50 mg total) by mouth 2 (two) times daily., Disp: 180 tablet, Rfl: 3 .  Magnesium 250 MG TABS, Take 1 tablet by mouth every morning. , Disp: , Rfl:  .  Multiple Vitamins-Minerals (CENTRUM SILVER PO), Take 1 tablet by mouth every morning. , Disp: , Rfl:  .  Multiple Vitamins-Minerals (PRESERVISION AREDS) CAPS, Take 1 capsule by mouth daily., Disp: , Rfl:  .  tiotropium (SPIRIVA) 18 MCG inhalation capsule, Place 1 capsule (18 mcg total) into inhaler and inhale daily., Disp: 30 capsule, Rfl: 11 .  vitamin B-12 (CYANOCOBALAMIN) 500 MCG tablet, Take 500 mcg by mouth every morning. , Disp: , Rfl:  .  doxycycline (VIBRA-TABS) 100 MG tablet, Take 1 tablet (100 mg total) by mouth 2 (two) times daily., Disp: 14 tablet, Rfl: 0

## 2015-12-03 DIAGNOSIS — H2511 Age-related nuclear cataract, right eye: Secondary | ICD-10-CM | POA: Diagnosis not present

## 2015-12-03 DIAGNOSIS — H25811 Combined forms of age-related cataract, right eye: Secondary | ICD-10-CM | POA: Diagnosis not present

## 2015-12-17 DIAGNOSIS — H25812 Combined forms of age-related cataract, left eye: Secondary | ICD-10-CM | POA: Diagnosis not present

## 2015-12-17 DIAGNOSIS — H2512 Age-related nuclear cataract, left eye: Secondary | ICD-10-CM | POA: Diagnosis not present

## 2015-12-25 NOTE — Progress Notes (Signed)
HPI: FU hypertension. Myoview in May of 2011 was normal. The study was not gated. Echocardiogram in August 2013 showed normal LV function. We felt that her dyspnea was most likely secondary to COPD. Carotid Dopplers in Sept 2015 showed no hemodynamically significant stenosis. Abdominal ultrasound April 2016 showed no aneurysm. Since I last saw her, she has some dyspnea on exertion. No orthopnea, PND or pedal edema. She has occasional chest tightness relieved with her pulmonary medications. No exertional chest pain.  Current Outpatient Prescriptions  Medication Sig Dispense Refill  . albuterol (PROAIR HFA) 108 (90 BASE) MCG/ACT inhaler INHALE 2 PUFFS BY MOUTH EVERY 6 HOURS AS NEEDED FOR WHEEZING 8.5 g 6  . ALPRAZolam (XANAX) 0.25 MG tablet TAKE ONE HALF TABLET BY MOUTH AS NEEDED for severe anxiety or panic - not more then every 12 hours. 20 tablet 2  . ARTIFICIAL TEAR OP Apply to eye.    Marland Kitchen aspirin 81 MG tablet Take 81 mg by mouth 2 (two) times daily.     . Calcium Carbonate-Vitamin D 600-400 MG-UNIT per tablet Take 1 tablet by mouth 2 (two) times daily.     . carvedilol (COREG) 12.5 MG tablet Take 1.5 tablets (18.75 mg total) by mouth 2 (two) times daily with a meal. 270 tablet 3  . doxycycline (VIBRA-TABS) 100 MG tablet Take 1 tablet (100 mg total) by mouth 2 (two) times daily. 14 tablet 0  . DUREZOL 0.05 % EMUL Place 1 drop into the right eye daily.    . Flaxseed, Linseed, (FLAXSEED OIL) 1000 MG CAPS Take 1 capsule by mouth daily at 12 noon.     . folic acid (FOLVITE) 572 MCG tablet Take 400 mcg by mouth daily.    . hydrochlorothiazide (HYDRODIURIL) 25 MG tablet TAKE 1 TABLET BY MOUTH EVERY MORNING 90 tablet 2  . KRILL OIL PO Take 500 mg by mouth.    . Lecithin 1200 MG CAPS Take 1,200 mg by mouth every morning.     Marland Kitchen losartan (COZAAR) 50 MG tablet Take 1 tablet (50 mg total) by mouth 2 (two) times daily. 180 tablet 3  . Magnesium 250 MG TABS Take 1 tablet by mouth every morning.     .  Multiple Vitamins-Minerals (CENTRUM SILVER PO) Take 1 tablet by mouth every morning.     . Multiple Vitamins-Minerals (PRESERVISION AREDS) CAPS Take 1 capsule by mouth daily.    Marland Kitchen tiotropium (SPIRIVA) 18 MCG inhalation capsule Place 1 capsule (18 mcg total) into inhaler and inhale daily. 30 capsule 11  . vitamin B-12 (CYANOCOBALAMIN) 500 MCG tablet Take 500 mcg by mouth every morning.      No current facility-administered medications for this visit.      Past Medical History:  Diagnosis Date  . Anxiety and depression   . Carotid artery disease (HCC)    hx CAD, dCHF listed in [place] - sees cardiologist, vasc and neurologist  . COPD (chronic obstructive pulmonary disease) ([place])    sees pulmonology  . DDD (degenerative disc disease)   . Disseminated superficial actinic porokeratosis    bilateral calves  . Fracture, spinal   . GERD (gastroesophageal reflux disease)    hx PUD 2ndary to NSAIDs 1975  . Hemorrhoids   . HTN (hypertension)   . Hx of adenomatous colonic polyps   . Hyperlipidemia    intol to statins  . IBS (irritable bowel syndrome)   . Memory loss    seen by neurology in 2015 and referred  to neuropsych for anxiety  . Migraine headache   . Osteoporosis   . RHEUMATIC FEVER, HX OF 10/15/2006   Qualifier: Diagnosis of  By: Jenny Reichmann MD, Hunt Oris   . Rheumatoid arthritis(714.0)    per her report, in remission for many years per her report  . Varicose veins    hx LE edema    Past Surgical History:  Procedure Laterality Date  . chronically infected lymph node removed  1973  . THYROGLOSSAL DUCT CYST     1943, 1944, 89    Social History   Social History  . Marital status: Divorced    Spouse name: N/A  . Number of children: 3  . Years of education: N/A   Occupational History  . Retired-homemaker    Social History Main Topics  . Smoking status: Former Smoker    Packs/day: 3.00    Years: 30.00    Types: Cigarettes    Quit date: 01/17/1985  . Smokeless tobacco: Never  Used     Comment: previous 80 pack year history  . Alcohol use No  . Drug use: No  . Sexual activity: No   Other Topics Concern  . Not on file   Social History Narrative   Work or School: volunteers at [employer] Situation: lives at [place]:       Lifestyle: no regular exercise; diet is ok          Family History  Problem Relation Age of Onset  . Heart disease Mother     MI  . Hypertension Mother   . Heart attack Mother   . Brain cancer Father   . Cancer Father   . Breast cancer Maternal Grandmother   . Stomach cancer Brother   . Cancer Brother   . Heart disease Brother   . Hypertension Brother   . Heart attack Brother   . AAA (abdominal aortic aneurysm) Brother   . Aneurysm Brother     brain  . Melanoma Brother   . Colon cancer Brother   . Heart disease Brother   . Alcohol abuse    . Heart attack Brother 68  . Allergies Sister   . Hypertension Sister     ROS: no fevers or chills, productive cough, hemoptysis, dysphasia, odynophagia, melena, hematochezia, dysuria, hematuria, rash, seizure activity, orthopnea, PND, pedal edema, claudication. Remaining systems are negative.  Physical Exam: Well-developed well-nourished in no acute distress.  Skin is warm and dry.  HEENT is normal.  Neck is supple.  Chest is clear to auscultation with normal expansion.  Cardiovascular exam is regular rate and rhythm.  Abdominal exam nontender or distended. No masses palpated. Extremities show no edema. neuro grossly intact  ECG  A/P  1 hyperlipidemia-continue diet. Intolerant to statins.  2 hypertension-blood pressure controlled. Continue present medications. Potassium and renal function monitored by primary care.  3 cerebrovascular disease-mild to most recent carotid Dopplers.  4 anxiety-have asked her to follow up with primary care for this issue.  Kirk Ruths, MD

## 2015-12-31 ENCOUNTER — Telehealth: Payer: Self-pay | Admitting: Family Medicine

## 2015-12-31 ENCOUNTER — Ambulatory Visit (INDEPENDENT_AMBULATORY_CARE_PROVIDER_SITE_OTHER): Payer: Medicare Other | Admitting: Cardiology

## 2015-12-31 ENCOUNTER — Other Ambulatory Visit: Payer: Self-pay | Admitting: *Deleted

## 2015-12-31 ENCOUNTER — Encounter: Payer: Self-pay | Admitting: Cardiology

## 2015-12-31 VITALS — BP 132/70 | HR 68 | Ht 64.0 in | Wt 187.0 lb

## 2015-12-31 DIAGNOSIS — F39 Unspecified mood [affective] disorder: Secondary | ICD-10-CM

## 2015-12-31 DIAGNOSIS — E7800 Pure hypercholesterolemia, unspecified: Secondary | ICD-10-CM

## 2015-12-31 DIAGNOSIS — I10 Essential (primary) hypertension: Secondary | ICD-10-CM | POA: Diagnosis not present

## 2015-12-31 DIAGNOSIS — I6523 Occlusion and stenosis of bilateral carotid arteries: Secondary | ICD-10-CM

## 2015-12-31 MED ORDER — ALPRAZOLAM 0.25 MG PO TABS: ORAL_TABLET | ORAL | 1 refills | Status: DC

## 2015-12-31 MED ORDER — HYDROCHLOROTHIAZIDE 25 MG PO TABS: 25.0000 mg | ORAL_TABLET | Freq: Every morning | ORAL | 3 refills | Status: DC

## 2015-12-31 NOTE — Patient Instructions (Signed)
Your physician wants you to follow-up in: 6 MONTHS WITH DR CRENSHAW You will receive a reminder letter in the mail two months in advance. If you don't receive a letter, please call our office to schedule the follow-up appointment.   If you need a refill on your cardiac medications before your next appointment, please call your pharmacy.  

## 2015-12-31 NOTE — Telephone Encounter (Signed)
Ok to refill, 

## 2015-12-31 NOTE — Telephone Encounter (Signed)
Pt called and is requesting a refill for xanax pt uses [facility] [phone] - [place], [place] - [address] AT [address] pt can be reached at [phone]

## 2015-12-31 NOTE — Telephone Encounter (Signed)
Called in med pharmacy

## 2016-02-03 ENCOUNTER — Ambulatory Visit: Payer: Medicare Other | Admitting: Family Medicine

## 2016-02-17 ENCOUNTER — Ambulatory Visit (INDEPENDENT_AMBULATORY_CARE_PROVIDER_SITE_OTHER): Payer: Medicare Other | Admitting: Family Medicine

## 2016-02-17 ENCOUNTER — Encounter: Payer: Self-pay | Admitting: Family Medicine

## 2016-02-17 VITALS — BP 120/80 | HR 68 | Wt 193.2 lb

## 2016-02-17 DIAGNOSIS — E2839 Other primary ovarian failure: Secondary | ICD-10-CM | POA: Diagnosis not present

## 2016-02-17 DIAGNOSIS — I10 Essential (primary) hypertension: Secondary | ICD-10-CM

## 2016-02-17 DIAGNOSIS — F411 Generalized anxiety disorder: Secondary | ICD-10-CM

## 2016-02-17 DIAGNOSIS — M810 Age-related osteoporosis without current pathological fracture: Secondary | ICD-10-CM | POA: Diagnosis not present

## 2016-02-17 MED ORDER — ALPRAZOLAM 0.25 MG PO TABS: ORAL_TABLET | ORAL | 1 refills | Status: DC

## 2016-02-17 NOTE — Progress Notes (Signed)
   Subjective:    Patient ID: Melissa Ferrell, female    DOB: 16-Mar-1935, 81 y.o.   MRN: [id number]  HPI Chief Complaint  Patient presents with  . follow-up    6 month follow-up   She is here to follow up. I saw her for the first time in July 2017.  She checks her BP daily and readings are within goal range.  She requests refill on alprazolam. She takes this rarely for anxiety.   She had cataract surgery in November and states she is having laser surgery next month for glaucoma.   She is due for a bone density this year after February 2nd.  Last DEXA was 02/18/2014. She has a history of osteoporosis.   Followed by pulmonologist for asthma, COPD, and emphysema.  Appointment with dermatologist in April with Dr. Ubaldo Glassing.   She got her flu shot.      Review of Systems Pertinent positives and negatives in the history of present illness.     Objective:   Physical Exam BP 120/80   Pulse 68   Wt 193 lb 3.2 oz (87.6 kg)   BMI 33.16 kg/m  Alert and in no distress. Tympanic membranes and canals are normal. Pharyngeal area is normal. Neck is supple without adenopathy or thyromegaly. Cardiac exam shows a regular sinus rhythm without murmurs or gallops. Lungs are clear to auscultation. Extremities without edema, pulses normal. She has stockings on.       Assessment & Plan:  Essential hypertension - Plan: COMPLETE METABOLIC PANEL WITH GFR  Generalized anxiety disorder - Plan: ALPRAZolam (XANAX) 0.25 MG tablet  Osteoporosis without current pathological fracture, unspecified osteoporosis type  She appears to be doing well.  Blood pressure is within goal. Reviewed her BP readings from home and they are all fine. Continue on current medications.  No issues or concerns today.  She is not fasting so we will check her lipids at her next visit.  Prescription given for DEXA so that she may go to the same facility where she had this 2 years ago.  Alprazolam prescription given to patient.  She is only taking this medication when she has a big event or test.  She will follow up pending results or in August for AWV. We will check fasting labs then.

## 2016-02-18 ENCOUNTER — Telehealth: Payer: Self-pay | Admitting: Pulmonary Disease

## 2016-02-18 ENCOUNTER — Encounter: Payer: Self-pay | Admitting: Family Medicine

## 2016-02-18 LAB — COMPLETE METABOLIC PANEL WITH GFR
ALBUMIN: 4.1 g/dL (ref 3.6–5.1)
ALK PHOS: 46 U/L (ref 33–130)
ALT: 19 U/L (ref 6–29)
AST: 21 U/L (ref 10–35)
BILIRUBIN TOTAL: 0.5 mg/dL (ref 0.2–1.2)
BUN: 17 mg/dL (ref 7–25)
CALCIUM: 9.7 mg/dL (ref 8.6–10.4)
CO2: 33 mmol/L — ABNORMAL HIGH (ref 20–31)
CREATININE: 1.12 mg/dL — AB (ref 0.60–0.93)
Chloride: 100 mmol/L (ref 98–110)
GFR, Est African American: 54 mL/min — ABNORMAL LOW (ref >=60)
GFR, Est Non African American: 47 mL/min — ABNORMAL LOW (ref >=60)
GLUCOSE: 110 mg/dL — AB (ref 65–99)
POTASSIUM: 3.8 mmol/L (ref 3.5–5.3)
SODIUM: 139 mmol/L (ref 135–146)
TOTAL PROTEIN: 6.7 g/dL (ref 6.1–8.1)

## 2016-02-18 NOTE — Addendum Note (Signed)
Addended by: Minette Headland A on: 02/18/2016 03:33 PM   Modules accepted: Orders

## 2016-02-18 NOTE — Telephone Encounter (Signed)
Spoke with pt in the lobby. She had an order for a bone density test done. Radiology will not take this order from her because it was not ordered by a [place] MD. Pt was wanting to know if BQ would be willing to order this. Advised that we do not typically order bone density tests and if BQ ordered it the reults would come to him and not her PCP. Pt stated that she will contact her PCP and find out where else she could go to have this done. Nothing further was needed.

## 2016-02-24 ENCOUNTER — Ambulatory Visit
Admission: RE | Admit: 2016-02-24 | Discharge: 2016-02-24 | Disposition: A | Discharge status: Home / Self Care | Payer: Medicare Other | Source: Ambulatory Visit | Attending: Family Medicine | Admitting: Family Medicine

## 2016-02-24 DIAGNOSIS — M8589 Other specified disorders of bone density and structure, multiple sites: Secondary | ICD-10-CM | POA: Diagnosis not present

## 2016-02-24 DIAGNOSIS — M810 Age-related osteoporosis without current pathological fracture: Secondary | ICD-10-CM

## 2016-02-24 DIAGNOSIS — Z780 Asymptomatic menopausal state: Secondary | ICD-10-CM | POA: Diagnosis not present

## 2016-02-24 DIAGNOSIS — E2839 Other primary ovarian failure: Secondary | ICD-10-CM

## 2016-02-29 ENCOUNTER — Encounter: Payer: Self-pay | Admitting: Family Medicine

## 2016-02-29 ENCOUNTER — Other Ambulatory Visit: Payer: Self-pay | Admitting: Family Medicine

## 2016-02-29 ENCOUNTER — Other Ambulatory Visit: Payer: Medicare Other

## 2016-02-29 DIAGNOSIS — M8580 Other specified disorders of bone density and structure, unspecified site: Secondary | ICD-10-CM | POA: Diagnosis not present

## 2016-02-29 DIAGNOSIS — E2839 Other primary ovarian failure: Secondary | ICD-10-CM

## 2016-03-01 LAB — VITAMIN D 25 HYDROXY (VIT D DEFICIENCY, FRACTURES): Vit D, 25-Hydroxy: 37 ng/mL (ref 30–100)

## 2016-03-03 DIAGNOSIS — H26491 Other secondary cataract, right eye: Secondary | ICD-10-CM | POA: Diagnosis not present

## 2016-03-17 DIAGNOSIS — H04123 Dry eye syndrome of bilateral lacrimal glands: Secondary | ICD-10-CM | POA: Diagnosis not present

## 2016-03-17 DIAGNOSIS — H11121 Conjunctival concretions, right eye: Secondary | ICD-10-CM | POA: Diagnosis not present

## 2016-03-17 DIAGNOSIS — T1511XA Foreign body in conjunctival sac, right eye, initial encounter: Secondary | ICD-10-CM | POA: Diagnosis not present

## 2016-03-30 ENCOUNTER — Encounter: Payer: Self-pay | Admitting: Pulmonary Disease

## 2016-03-30 ENCOUNTER — Ambulatory Visit (INDEPENDENT_AMBULATORY_CARE_PROVIDER_SITE_OTHER): Payer: Medicare Other | Admitting: Pulmonary Disease

## 2016-03-30 DIAGNOSIS — J432 Centrilobular emphysema: Secondary | ICD-10-CM | POA: Diagnosis not present

## 2016-03-30 MED ORDER — DOXYCYCLINE HYCLATE 100 MG PO TABS: 100.0000 mg | ORAL_TABLET | Freq: Two times a day (BID) | ORAL | 0 refills | Status: DC

## 2016-03-30 NOTE — Progress Notes (Signed)
Subjective:    Patient ID: Melissa Ferrell, female    DOB: Dec 19, 1935, 81 y.o.   MRN: [id number]  Synopsis: COPD followed by Dr. Gwenette Greet prior to 06/2014; she has a history of rheumatoid arthritis. She previously smoked 3 ppd and quit in 1986.    HPI Chief Complaint  Patient presents with  . Follow-up    pt states she is doing well regarding her breathing.    Ronit has been struggling with her cataracts.  She says that this is making her stressed out and she needs to use her albuterol more.   She needed doxycycline back in the cold weather she had a severe sinus infection and went ahead and took it.    She is still taking Spiriva daily, no problems.  She has minimal wheezing now.  The albuterol helps with chest tightness and wheezing when she these symptoms.   Past Medical History:  Diagnosis Date  . Anxiety and depression   . Carotid artery disease (HCC)    hx CAD, dCHF listed in [place] - sees cardiologist, vasc and neurologist  . COPD (chronic obstructive pulmonary disease) ([place])    sees pulmonology  . DDD (degenerative disc disease)   . Disseminated superficial actinic porokeratosis    bilateral calves  . Fracture, spinal   . GERD (gastroesophageal reflux disease)    hx PUD 2ndary to NSAIDs 1975  . Hemorrhoids   . HTN (hypertension)   . Hx of adenomatous colonic polyps   . Hyperlipidemia    intol to statins  . IBS (irritable bowel syndrome)   . Memory loss    seen by neurology in 2015 and referred to neuropsych for anxiety  . Migraine headache   . Osteoporosis   . RHEUMATIC FEVER, HX OF 10/15/2006   Qualifier: Diagnosis of  By: Jenny Reichmann MD, Hunt Oris   . Rheumatoid arthritis(714.0)    per her report, in remission for many years per her report  . Varicose veins    hx LE edema      Review of Systems  Constitutional: Negative for diaphoresis, fatigue and fever.  HENT: Negative for rhinorrhea, sinus pressure and sneezing.   Respiratory: Negative for cough, shortness  of breath and wheezing.   Cardiovascular: Negative for chest pain, palpitations and leg swelling.       Objective:   Physical Exam Vitals:   03/30/16 0952  BP: (!) 144/72  Pulse: 67  SpO2: 96%  Weight: 195 lb (88.5 kg)  Height: '5\' 4"'$  (1.626 m)   RA  Gen: well appearing HENT: OP clear, neck supple PULM: CTA B, normal percussion CV: RRR, no mgr, trace edema GI: BS+, soft, nontender Derm: no cyanosis or rash Psyche: normal mood and affect   PFT:  PFT's 09/12/2011:  FEV1  1.37 (79%) ratio 47, FEV1 increased 14% with BD, +++ airtrapping, DLCO 60% August 2016 pulmonary function testing> ratio 58%, FEV1 1.34 L (69% predicted, 20% change with broncho-dilator), total lung capacity 4.87 L (99% predicted), DLCO 15.96 (69% predicted) sees.     Assessment & Plan:  COPD (chronic obstructive pulmonary disease) with emphysema ([place]) This has been a stable interval for Epes.  She has not had an exacerbation since the last visit.  She does well with Spiriva handihaler, not respimat due to an allergy.  Plan: Counseled on the importance of exercise    Current Outpatient Prescriptions:  .  albuterol (PROAIR HFA) 108 (90 BASE) MCG/ACT inhaler, INHALE 2 PUFFS BY MOUTH EVERY 6 HOURS AS  NEEDED FOR WHEEZING, Disp: 8.5 g, Rfl: 6 .  ALPRAZolam (XANAX) 0.25 MG tablet, TAKE ONE HALF TABLET BY MOUTH AS NEEDED for severe anxiety or panic - not more then every 12 hours., Disp: 20 tablet, Rfl: 1 .  ARTIFICIAL TEAR OP, Apply to eye. soothex, Disp: , Rfl:  .  aspirin 81 MG tablet, Take 81 mg by mouth 2 (two) times daily. , Disp: , Rfl:  .  Calcium Carbonate-Vitamin D 600-400 MG-UNIT per tablet, Take 1 tablet by mouth 2 (two) times daily. , Disp: , Rfl:  .  carvedilol (COREG) 12.5 MG tablet, Take 1.5 tablets (18.75 mg total) by mouth 2 (two) times daily with a meal., Disp: 270 tablet, Rfl: 3 .  Flaxseed, Linseed, (FLAXSEED OIL) 1000 MG CAPS, Take 1 capsule by mouth daily at 12 noon. , Disp: , Rfl:  .   folic acid (FOLVITE) 009 MCG tablet, Take 400 mcg by mouth daily., Disp: , Rfl:  .  hydrochlorothiazide (HYDRODIURIL) 25 MG tablet, Take 1 tablet (25 mg total) by mouth every morning., Disp: 90 tablet, Rfl: 3 .  KRILL OIL PO, Take 500 mg by mouth., Disp: , Rfl:  .  Lecithin 1200 MG CAPS, Take 1,200 mg by mouth every morning. , Disp: , Rfl:  .  losartan (COZAAR) 50 MG tablet, Take 1 tablet (50 mg total) by mouth 2 (two) times daily., Disp: 180 tablet, Rfl: 3 .  Magnesium 250 MG TABS, Take 1 tablet by mouth every morning. , Disp: , Rfl:  .  Multiple Vitamins-Minerals (CENTRUM SILVER PO), Take 1 tablet by mouth every morning. , Disp: , Rfl:  .  Multiple Vitamins-Minerals (PRESERVISION AREDS) CAPS, Take 1 capsule by mouth daily., Disp: , Rfl:  .  tiotropium (SPIRIVA) 18 MCG inhalation capsule, Place 1 capsule (18 mcg total) into inhaler and inhale daily., Disp: 30 capsule, Rfl: 11 .  vitamin B-12 (CYANOCOBALAMIN) 500 MCG tablet, Take 500 mcg by mouth every morning. , Disp: , Rfl:  .  doxycycline (VIBRA-TABS) 100 MG tablet, Take 1 tablet (100 mg total) by mouth 2 (two) times daily., Disp: 10 tablet, Rfl: 0

## 2016-03-30 NOTE — Patient Instructions (Signed)
Keep taking the Spiriva as you are doing Stay active, exercise as much as possible Follow up with [place] in 6 months or sooner if needed

## 2016-03-30 NOTE — Assessment & Plan Note (Signed)
This has been a stable interval for Melissa Ferrell.  She has not had an exacerbation since the last visit.  She does well with Spiriva handihaler, not respimat due to an allergy.  Plan: Counseled on the importance of exercise

## 2016-04-19 ENCOUNTER — Other Ambulatory Visit: Payer: Self-pay | Admitting: Family Medicine

## 2016-04-19 DIAGNOSIS — F411 Generalized anxiety disorder: Secondary | ICD-10-CM

## 2016-04-19 NOTE — Telephone Encounter (Signed)
Is this okay to refill? 

## 2016-04-19 NOTE — Telephone Encounter (Signed)
Ok to refill 

## 2016-04-19 NOTE — Telephone Encounter (Signed)
Called in med to pharmacy  

## 2016-05-04 DIAGNOSIS — L918 Other hypertrophic disorders of the skin: Secondary | ICD-10-CM | POA: Diagnosis not present

## 2016-05-04 DIAGNOSIS — L570 Actinic keratosis: Secondary | ICD-10-CM | POA: Diagnosis not present

## 2016-05-04 DIAGNOSIS — L814 Other melanin hyperpigmentation: Secondary | ICD-10-CM | POA: Diagnosis not present

## 2016-05-04 DIAGNOSIS — L821 Other seborrheic keratosis: Secondary | ICD-10-CM | POA: Diagnosis not present

## 2016-05-04 DIAGNOSIS — L565 Disseminated superficial actinic porokeratosis (DSAP): Secondary | ICD-10-CM | POA: Diagnosis not present

## 2016-05-12 NOTE — Progress Notes (Signed)
HPI: FU hypertension. Myoview in May of 2011 was normal. The study was not gated. Echocardiogram in August 2013 showed normal LV function. We felt that her dyspnea was most likely secondary to COPD. Carotid Dopplers in Sept 2015 showed no hemodynamically significant stenosis. Abdominal ultrasound April 2016 showed no aneurysm. Since I last saw her, she describes dyspnea on exertion but no orthopnea. She describes increased pedal edema. No exertional chest pain.  Current Outpatient Prescriptions  Medication Sig Dispense Refill  . albuterol (PROAIR HFA) 108 (90 BASE) MCG/ACT inhaler INHALE 2 PUFFS BY MOUTH EVERY 6 HOURS AS NEEDED FOR WHEEZING 8.5 g 6  . ALPRAZolam (XANAX) 0.25 MG tablet TAKE 1/2 TABLET BY MOUTH AS NEEDED FOR SEVERE ANXIETY OR PANIC. NOT MORE THEN EVERY 12 HOURS 20 tablet 0  . ARTIFICIAL TEAR OP Apply to eye. soothex    . aspirin 81 MG tablet Take 81 mg by mouth 2 (two) times daily.     . Calcium Carbonate-Vitamin D 600-400 MG-UNIT per tablet Take 1 tablet by mouth 2 (two) times daily.     . Carboxymethylcellul-Glycerin (OPTIVE) 0.5-0.9 % SOLN Apply to eye as needed.    . carvedilol (COREG) 12.5 MG tablet Take 1.5 tablets (18.75 mg total) by mouth 2 (two) times daily with a meal. 270 tablet 3  . doxycycline (VIBRA-TABS) 100 MG tablet Take 1 tablet (100 mg total) by mouth 2 (two) times daily. 10 tablet 0  . Flaxseed, Linseed, (FLAXSEED OIL) 1000 MG CAPS Take 1 capsule by mouth daily at 12 noon.     . folic acid (FOLVITE) 100 MCG tablet Take 400 mcg by mouth daily.    . hydrochlorothiazide (HYDRODIURIL) 25 MG tablet Take 1 tablet (25 mg total) by mouth every morning. 90 tablet 3  . KRILL OIL PO Take 500 mg by mouth.    . Lecithin 1200 MG CAPS Take 1,200 mg by mouth every morning.     Marland Kitchen losartan (COZAAR) 50 MG tablet Take 1 tablet (50 mg total) by mouth 2 (two) times daily. 180 tablet 3  . Magnesium 250 MG TABS Take 1 tablet by mouth every morning.     . Multiple  Vitamins-Minerals (CENTRUM SILVER PO) Take 1 tablet by mouth every morning.     . multivitamin-lutein (OCUVITE-LUTEIN) CAPS capsule Take 1 capsule by mouth daily.    Marland Kitchen tiotropium (SPIRIVA) 18 MCG inhalation capsule Place 1 capsule (18 mcg total) into inhaler and inhale daily. 30 capsule 11  . vitamin B-12 (CYANOCOBALAMIN) 500 MCG tablet Take 500 mcg by mouth every morning.      No current facility-administered medications for this visit.      Past Medical History:  Diagnosis Date  . Anxiety and depression   . Carotid artery disease (HCC)    hx CAD, dCHF listed in [place] - sees cardiologist, vasc and neurologist  . COPD (chronic obstructive pulmonary disease) ([place])    sees pulmonology  . DDD (degenerative disc disease)   . Disseminated superficial actinic porokeratosis    bilateral calves  . Fracture, spinal   . GERD (gastroesophageal reflux disease)    hx PUD 2ndary to NSAIDs 1975  . Hemorrhoids   . HTN (hypertension)   . Hx of adenomatous colonic polyps   . Hyperlipidemia    intol to statins  . IBS (irritable bowel syndrome)   . Memory loss    seen by neurology in 2015 and referred to neuropsych for anxiety  . Migraine headache   .  Osteoporosis   . RHEUMATIC FEVER, HX OF 10/15/2006   Qualifier: Diagnosis of  By: Jenny Reichmann MD, Hunt Oris   . Rheumatoid arthritis(714.0)    per her report, in remission for many years per her report  . Varicose veins    hx LE edema    Past Surgical History:  Procedure Laterality Date  . chronically infected lymph node removed  1973  . THYROGLOSSAL DUCT CYST     1943, 1944, 1    Social History   Social History  . Marital status: Divorced    Spouse name: N/A  . Number of children: 3  . Years of education: N/A   Occupational History  . Retired-homemaker    Social History Main Topics  . Smoking status: Former Smoker    Packs/day: 3.00    Years: 30.00    Types: Cigarettes    Quit date: 01/17/1985  . Smokeless tobacco: Never Used      Comment: previous 80 pack year history  . Alcohol use No  . Drug use: No  . Sexual activity: No   Other Topics Concern  . Not on file   Social History Narrative   Work or School: volunteers at [employer] Situation: lives at [place]:       Lifestyle: no regular exercise; diet is ok          Family History  Problem Relation Age of Onset  . Heart disease Mother     MI  . Hypertension Mother   . Heart attack Mother   . Brain cancer Father   . Cancer Father   . Breast cancer Maternal Grandmother   . Stomach cancer Brother   . Cancer Brother   . Heart disease Brother   . Hypertension Brother   . Heart attack Brother   . AAA (abdominal aortic aneurysm) Brother   . Aneurysm Brother     brain  . Melanoma Brother   . Colon cancer Brother   . Heart disease Brother   . Alcohol abuse    . Heart attack Brother 78  . Allergies Sister   . Hypertension Sister     IPJ:[id number] "brain function", pain in right neck but  no fevers or chills, productive cough, hemoptysis, dysphasia, odynophagia, melena, hematochezia, dysuria, hematuria, rash, seizure activity, orthopnea, PND, claudication. Remaining systems are negative.  Physical Exam: Well-developed obese in no acute distress.  Skin is warm and dry.  HEENT is normal. No bruits Neck is supple.  Chest is clear to auscultation with normal expansion.  Cardiovascular exam is regular rate and rhythm. No murmur Abdominal exam nontender or distended. No masses palpated. Extremities show trace edema. neuro grossly intact  ECG- Sinus rhythm at a rate of 64. No ST changes. personally reviewed  A/P  1 Hypertension-blood pressure is elevated. However she follows this at home and it is typically controlled. Continue present medications.  2 hyperlipidemia-continue diet. Patient is intolerant to statins.  3 carotid artery disease-continue aspirin. Intolerant to statins. Mild on most recent  Dopplers.  4 anxiety-management per primary care.  5 dyspnea/pedal edema-she does not appear to be significantly volume overloaded on examination. We will plan to repeat echocardiogram to assess LV function. Check BNP. If elevated she may require increased dose of diuretic.  Kirk Ruths, MD

## 2016-05-19 ENCOUNTER — Ambulatory Visit (INDEPENDENT_AMBULATORY_CARE_PROVIDER_SITE_OTHER): Payer: Medicare Other | Admitting: Cardiology

## 2016-05-19 ENCOUNTER — Encounter: Payer: Self-pay | Admitting: Cardiology

## 2016-05-19 VITALS — BP 152/94 | HR 64 | Ht 64.0 in | Wt 195.0 lb

## 2016-05-19 DIAGNOSIS — E7800 Pure hypercholesterolemia, unspecified: Secondary | ICD-10-CM

## 2016-05-19 DIAGNOSIS — I10 Essential (primary) hypertension: Secondary | ICD-10-CM

## 2016-05-19 DIAGNOSIS — R0600 Dyspnea, unspecified: Secondary | ICD-10-CM

## 2016-05-19 NOTE — Patient Instructions (Signed)

## 2016-05-20 ENCOUNTER — Telehealth: Payer: Self-pay | Admitting: *Deleted

## 2016-05-20 DIAGNOSIS — R7989 Other specified abnormal findings of blood chemistry: Secondary | ICD-10-CM

## 2016-05-20 LAB — BRAIN NATRIURETIC PEPTIDE: BRAIN NATRIURETIC PEPTIDE: 171 pg/mL — AB (ref <100)

## 2016-05-20 NOTE — Telephone Encounter (Signed)
Spoke with pt, aware of results. She reports that she is not able to take furosemide, it made her swell in the past. In the past they had her take HCTZ twice daily. Will forward for dr Stanford Breed review

## 2016-05-20 NOTE — Telephone Encounter (Signed)
-----   Message from Lelon Perla, MD sent at 05/20/2016 10:47 AM EDT ----- DC HCTZ; lasix 20 mg daily; bmet one week Kirk Ruths

## 2016-05-22 NOTE — Telephone Encounter (Signed)
[facility] for hctz bid bmet one week [employer]

## 2016-05-24 MED ORDER — HYDROCHLOROTHIAZIDE 25 MG PO TABS
25.0000 mg | ORAL_TABLET | Freq: Two times a day (BID) | ORAL | 3 refills | Status: DC
Start: 2016-05-24 — End: 2016-09-26

## 2016-05-24 NOTE — Telephone Encounter (Signed)
Spoke with pt, Aware of dr crenshaw's recommendations.  Lab orders mailed to the pt  

## 2016-05-25 ENCOUNTER — Encounter: Payer: Self-pay | Admitting: Family Medicine

## 2016-05-26 ENCOUNTER — Ambulatory Visit (INDEPENDENT_AMBULATORY_CARE_PROVIDER_SITE_OTHER): Payer: Medicare Other | Admitting: Family Medicine

## 2016-05-26 ENCOUNTER — Encounter: Payer: Self-pay | Admitting: Family Medicine

## 2016-05-26 VITALS — BP 140/80 | HR 58 | Temp 97.8°F | Wt 194.0 lb

## 2016-05-26 DIAGNOSIS — I739 Peripheral vascular disease, unspecified: Secondary | ICD-10-CM

## 2016-05-26 DIAGNOSIS — F411 Generalized anxiety disorder: Secondary | ICD-10-CM

## 2016-05-26 DIAGNOSIS — M542 Cervicalgia: Secondary | ICD-10-CM

## 2016-05-26 MED ORDER — ALPRAZOLAM 0.25 MG PO TABS: ORAL_TABLET | ORAL | 1 refills | Status: DC

## 2016-05-26 NOTE — Progress Notes (Signed)
   Subjective:    Patient ID: Melissa Ferrell, female    DOB: [birth year], 81 y.o.   MRN: [id number]  HPI Chief Complaint  Patient presents with  . neck pain    neck pain since january. not sure if its swollen, doesn't hurt to touch. having sinuses.    She is here requesting a prescription for compression stockings and refill of alprazolam.  States she has intermittent swelling in her feet and ankles. This is chronic and she has been wearing compression stockings and needs a new pair.   States she was recently diagnosed with CHF and has been more anxious than usual. States she does not abuse the medication and has been taking alprazolam for years.  She is scheduled for echocardiogram and states she takes the medication before any procedure or test.   She complains of intermittent right sided neck pain since February. States it feels "tight". Pain is non radiating and is not worse with movement. States nothing aggravates it or alleviates the pain. States she discussed this with her cardiologist also. Pain is unchanged.  She would like to see an ENT.    Denies fever, chills, dizziness, chest pain, palpitations, shortness of breath, orthopnea, abdominal pain, N/V/D.   Reviewed allergies, medications, past medical, surgical,  and social history.   Review of Systems Pertinent positives and negatives in the history of present illness.     Objective:   Physical Exam  Constitutional: She is oriented to person, place, and time. She appears well-developed and well-nourished. No distress.  HENT:  Right Ear: Tympanic membrane and ear canal normal.  Left Ear: Tympanic membrane and ear canal normal.  Nose: Nose normal.  Mouth/Throat: Uvula is midline, oropharynx is clear and moist and mucous membranes are normal.  Eyes: Conjunctivae and lids are normal.  Neck: Normal range of motion and full passive range of motion without pain. Neck supple. No JVD present. No spinous process tenderness and no  muscular tenderness present. Carotid bruit is not present. No thyromegaly present.  Cardiovascular: Normal rate and regular rhythm.   Pulmonary/Chest: Effort normal and breath sounds normal.  Lymphadenopathy:    She has no cervical adenopathy.       Right: No supraclavicular adenopathy present.       Left: No supraclavicular adenopathy present.  Neurological: She is alert and oriented to person, place, and time. She has normal strength. No cranial nerve deficit or sensory deficit.  Skin: Skin is warm and dry. No rash noted. No pallor.   BP 140/80   Pulse (!) 58   Temp 97.8 F (36.6 C) (Oral)   Wt 194 lb (88 kg)   SpO2 96%   BMI 33.30 kg/m      Assessment & Plan:  Neck pain on right side  PVD (peripheral vascular disease) (HCC)  Generalized anxiety disorder - Plan: ALPRAZolam (XANAX) 0.25 MG tablet  Discussed that HEENT exam is unremarkable. No obvious explanation for her symptoms. I will refer her to ENT per patient request.  Prescription given for compression stockings.  Refilled Alprazolam, she has been stable on this for years and no sign of abuse. Advised of potential risks and to use caution with the medication.  She will follow up as scheduled.

## 2016-05-31 DIAGNOSIS — R7989 Other specified abnormal findings of blood chemistry: Secondary | ICD-10-CM | POA: Diagnosis not present

## 2016-05-31 LAB — BASIC METABOLIC PANEL
BUN: 22 mg/dL (ref 7–25)
CHLORIDE: 97 mmol/L — AB (ref 98–110)
CO2: 31 mmol/L (ref 20–31)
Calcium: 9.5 mg/dL (ref 8.6–10.4)
Creat: 1.22 mg/dL — ABNORMAL HIGH (ref 0.60–0.93)
GLUCOSE: 95 mg/dL (ref 65–99)
POTASSIUM: 3.9 mmol/L (ref 3.5–5.3)
Sodium: 134 mmol/L — ABNORMAL LOW (ref 135–146)

## 2016-06-02 ENCOUNTER — Ambulatory Visit (HOSPITAL_COMMUNITY): Payer: Medicare Other | Attending: Cardiovascular Disease

## 2016-06-02 ENCOUNTER — Other Ambulatory Visit: Payer: Self-pay

## 2016-06-02 DIAGNOSIS — R0600 Dyspnea, unspecified: Secondary | ICD-10-CM | POA: Diagnosis not present

## 2016-06-02 DIAGNOSIS — I361 Nonrheumatic tricuspid (valve) insufficiency: Secondary | ICD-10-CM | POA: Insufficient documentation

## 2016-06-02 DIAGNOSIS — I420 Dilated cardiomyopathy: Secondary | ICD-10-CM | POA: Insufficient documentation

## 2016-06-02 DIAGNOSIS — I340 Nonrheumatic mitral (valve) insufficiency: Secondary | ICD-10-CM | POA: Diagnosis not present

## 2016-06-02 DIAGNOSIS — I371 Nonrheumatic pulmonary valve insufficiency: Secondary | ICD-10-CM | POA: Insufficient documentation

## 2016-06-06 ENCOUNTER — Encounter: Payer: Self-pay | Admitting: Cardiology

## 2016-07-24 ENCOUNTER — Other Ambulatory Visit: Payer: Self-pay | Admitting: Cardiology

## 2016-08-22 ENCOUNTER — Encounter: Payer: Self-pay | Admitting: Family Medicine

## 2016-08-22 ENCOUNTER — Ambulatory Visit (INDEPENDENT_AMBULATORY_CARE_PROVIDER_SITE_OTHER): Payer: Medicare Other | Admitting: Family Medicine

## 2016-08-22 VITALS — BP 126/80 | HR 68 | Ht 63.25 in | Wt 198.2 lb

## 2016-08-22 DIAGNOSIS — R5383 Other fatigue: Secondary | ICD-10-CM | POA: Diagnosis not present

## 2016-08-22 DIAGNOSIS — Z0000 Encounter for general adult medical examination without abnormal findings: Secondary | ICD-10-CM

## 2016-08-22 DIAGNOSIS — E785 Hyperlipidemia, unspecified: Secondary | ICD-10-CM | POA: Diagnosis not present

## 2016-08-22 DIAGNOSIS — M8580 Other specified disorders of bone density and structure, unspecified site: Secondary | ICD-10-CM

## 2016-08-22 DIAGNOSIS — F411 Generalized anxiety disorder: Secondary | ICD-10-CM

## 2016-08-22 LAB — CBC WITH DIFFERENTIAL/PLATELET
BASOS ABS: 71 {cells}/uL (ref 0–200)
Basophils Relative: 1 %
EOS ABS: 142 {cells}/uL (ref 15–500)
EOS PCT: 2 %
HCT: 41.1 % (ref 35.0–45.0)
Hemoglobin: 14 g/dL (ref 11.7–15.5)
LYMPHS ABS: 1349 {cells}/uL (ref 850–3900)
LYMPHS PCT: 19 %
MCH: 31.4 pg (ref 27.0–33.0)
MCHC: 34.1 g/dL (ref 32.0–36.0)
MCV: 92.2 fL (ref 80.0–100.0)
MONOS PCT: 9 %
MPV: 10.1 fL (ref 7.5–12.5)
Monocytes Absolute: 639 cells/uL (ref 200–950)
NEUTROS ABS: 4899 {cells}/uL (ref 1500–7800)
Neutrophils Relative %: 69 %
PLATELETS: 320 10*3/uL (ref 140–400)
RBC: 4.46 MIL/uL (ref 3.80–5.10)
RDW: 13.7 % (ref 11.0–15.0)
WBC: 7.1 10*3/uL (ref 4.0–10.5)

## 2016-08-22 LAB — COMPLETE METABOLIC PANEL WITH GFR
ALT: 17 U/L (ref 6–29)
AST: 24 U/L (ref 10–35)
Albumin: 4.3 g/dL (ref 3.6–5.1)
Alkaline Phosphatase: 50 U/L (ref 33–130)
BILIRUBIN TOTAL: 0.7 mg/dL (ref 0.2–1.2)
BUN: 22 mg/dL (ref 7–25)
CHLORIDE: 91 mmol/L — AB (ref 98–110)
CO2: 29 mmol/L (ref 20–32)
Calcium: 10.2 mg/dL (ref 8.6–10.4)
Creat: 1.17 mg/dL — ABNORMAL HIGH (ref 0.60–0.93)
GFR, EST NON AFRICAN AMERICAN: 44 mL/min — AB (ref >=60)
GFR, Est African American: 51 mL/min — ABNORMAL LOW (ref >=60)
Glucose, Bld: 99 mg/dL (ref 65–99)
POTASSIUM: 4.2 mmol/L (ref 3.5–5.3)
SODIUM: 134 mmol/L — AB (ref 135–146)
Total Protein: 6.9 g/dL (ref 6.1–8.1)

## 2016-08-22 LAB — LIPID PANEL
CHOL/HDL RATIO: 4 ratio (ref <5.0)
CHOLESTEROL: 225 mg/dL — AB (ref <200)
HDL: 56 mg/dL (ref >50)
LDL Cholesterol: 151 mg/dL — ABNORMAL HIGH (ref <100)
TRIGLYCERIDES: 88 mg/dL (ref <150)
VLDL: 18 mg/dL (ref <30)

## 2016-08-22 LAB — TSH: TSH: 1.8 mIU/L

## 2016-08-22 MED ORDER — ALPRAZOLAM 0.25 MG PO TABS: ORAL_TABLET | ORAL | 0 refills | Status: DC

## 2016-08-22 NOTE — Patient Instructions (Addendum)
MEDICARE PREVENTATIVE SERVICES (FEMALE) AND PERSONALIZED PLAN for Melissa Ferrell August 22, 2016  Continue on current medication regimen and let me know if your blood pressures are not consistently <130/80.     GENERAL RECOMMENDATIONS FOR GOOD HEALTH:  Supplements:  . Take a daily baby Aspirin 81mg  at bedtime for heart health unless you have a history of gastrointestinal bleed, allergy to aspirin, or are already taking higher dose Aspirin or other antiplatelet or blood thinner medication.   . Consume 1200 mg of Calcium daily through dietary calcium or supplement if you are female age 106 or older, or men 16 and older.   Men aged 40-70 should consume 1000 mg of Calcium daily. . Take 600 IU of Vitamin D daily.  Take 800 IU of Calcium daily if you are older than age 60.  . Take a general multivitamin daily.   Healthy diet: Eat a variety of foods, including fruits, vegetables, vegetable protein such as beans, lentils, tofu, and grains, such as rice.  Limit meat or animal protein, but if you eat meat, choose leans cuts such as chicken, fish, or [place].  Drink plenty of water daily.  Decrease saturated fat in the diet, avoid lots of red meat, processed foods, sweets, fast foods, and fried foods.  Limit salt and caffeine intake.  Exercise: Aerobic exercise helps maintain good heart health. Weight bearing exercise helps keep bones and muscles working strong.  We recommend at least 30-40 minutes of exercise most days of the week.   Fall prevention: Falls are the leading cause of injuries, accidents, and accidental deaths in people over the age of 97. Falling is a real threat to your ability to live on your own.  Causes include poor eyesight or poor hearing, illness, poor lighting, throw rugs, clutter in your home, and medication side effects causing dizziness or balance problems.  Such medications can include medications for depression, sleep problems, high blood pressure, diabetes, and heart  conditions.   PREVENTION  Be sure your home is as safe as possible. Here are some tips:  Wear shoes with non-skid soles (not house slippers).   Be sure your home and outside area are well lit.   Use night lights throughout your house, including hallways and stairways.   Remove clutter and clean up spills on floors and walkways.   Remove throw rugs or fasten them to the floor with carpet tape. Tack down carpet edges.   Do not place electrical cords across pathways.   Install grab bars in your bathtub, shower, and toilet area. Towel bars should not be used as a grab bar.   Install handrails on both sides of stairways.   Do not climb on stools or stepladders. Get someone else to help with jobs that require climbing.   Do not wax your floors at all, or use a non-skid wax.   Repair uneven or unsafe sidewalks, walkways or stairs.   Keep frequently used items within reach.   Be aware of pets so you do not trip.  Get regular check-ups from your doctor, and take good care of yourself:  Have your eyes checked every year for vision changes, cataracts, glaucoma, and other eye problems. Wear eyeglasses as directed.   Have your hearing checked every 2 years, or anytime you or others think that you cannot hear well. Use hearing aids as directed.   See your caregiver if you have foot pain or corns. Sore feet can contribute to falls.   Let your caregiver know  if a medicine is making you feel dizzy or making you lose your balance.   Use a cane, walker, or wheelchair as directed. Use walker or wheelchair brakes when getting in and out.   When you get up from bed, sit on the side of the bed for 1 to 2 minutes before you stand up. This will give your blood pressure time to adjust, and you will feel less dizzy.   If you need to go to the bathroom often, consider using a bedside commode.  Disease prevention:  If you smoke or chew tobacco, find out from your caregiver how to quit. It can  literally save your life, no matter how long you have been a tobacco user. If you do not use tobacco, never begin. Medicare does cover some smoking cessation counseling.  Maintain a healthy diet and normal weight. Increased weight leads to problems with blood pressure and diabetes. We check your height, weight, and BMI as part of your yearly visit.  The Body Mass Index or BMI is a way of measuring how much of your body is fat. Having a BMI above 27 increases the risk of heart disease, diabetes, hypertension, stroke and other problems related to obesity. Your caregiver can help determine your BMI and based on it develop an exercise and dietary program to help you achieve or maintain this important measurement at a healthful level.  High blood pressure causes heart and blood vessel problems.  Persistent high blood pressure should be treated with medicine if weight loss and exercise do not work.  We check your blood pressure as part of your yearly visit.  Avoid drinking alcohol in excess (more than two drinks per day).  Avoid use of street drugs. Do not share needles with anyone. Ask for professional help if you need assistance or instructions on stopping the use of alcohol, cigarettes, and/or drugs.  Brush your teeth twice a day with fluoride toothpaste, and floss once a day. Good oral hygiene prevents tooth decay and gum disease. The problems can be painful, unattractive, and can cause other health problems. Visit your dentist for a routine oral and dental checkup and preventive care every 6-12 months.   See your eye doctor yearly for routine screening for things like glaucoma.  Look at your skin regularly.  Use a mirror to look at your back. Notify your caregivers of changes in moles, especially if there are changes in shapes, colors, a size larger than a pencil eraser, an irregular border, or development of new moles.  Safety:  Use seatbelts 100% of the time, whether driving or as a passenger.   Use safety devices such as hearing protection if you work in environments with loud noise or significant background noise.  Use safety glasses when doing any work that could send debris in to the eyes.  Use a helmet if you ride a bike or motorcycle.  Use appropriate safety gear for contact sports.  Talk to your caregiver about gun safety.  Use sunscreen with a SPF (or skin protection factor) of 15 or greater.  Lighter skinned people are at a greater risk of skin cancer. Don't forget to also wear sunglasses in order to protect your eyes from too much damaging sunlight. Damaging sunlight can accelerate cataract formation.   If you have multiple sexual partners, or if you are not in a monogamous relationship, practice safe sex. Use condoms. Condoms are used to help reduce the spread of sexually transmitted infections (or STIs).  Consider an  HIV test if you have never been tested.  Consider routine screening for STIs if you have multiple sexual partners.   Keep carbon monoxide and smoke detectors in your home functioning at all times. Change the batteries every 6 months or use a model that plugs into the wall or is hard wired in.   END OF LIFE PLANNING/ADVANCED DIRECTIVES Advance health-care planning is deciding the kind of care you want at the end of life. While alert competent adults are able to exercise their rights to make health care and financial decisions, problems arise when an individual becomes unconscious, incapacitated, or otherwise unable to communicate or make such decisions. Advance health care directives are the legal documents in which you give written instructions about your choices limited, aggressive or palliative care if, in the future, you cannot speak for yourself.  Advanced directives include the following: [place] allows you to appoint someone to act as your health care agent to make health care decisions for you should it be determined by your health care  provider that you are no longer able to make these decisions for yourself.  A Living Will is a legal document in which you can declare that under certain conditions you desire your life not be prolonged by extraordinary or artificial means during your last illness or when you are near death. We can provide you with sample advanced directives, you can get an attorney to prepare these for you, or you can visit [place] Secretary of State's website for additional information and resources at [email or web address]  Further, I recommend you have an attorney prepare a Will and Durable Power of Attorney if you haven't done so already.  Please get [place] a copy of your health care Advanced Directives.   PREVENTATIV E CARE RECOMMENDATIONS:  Vaccinations: We recommend the following vaccinations as part of your preventative care:  Pneumococcal vaccine is recommended to protect against certain types of pneumonia.  This is normally recommended for adults age 56 or older once, or up to every 5 years for those at high risk.  The vaccine is also recommended for adults younger than 79 years old with certain underlying conditions that make them high risk for pneumonia.  Influenza vaccine is recommended to protect against seasonal influenza or "the flu." Influenza is a serious disease that can lead to hospitalization and sometimes even death. Traditional flu vaccines (called trivalent vaccines) are made to protect against three flu viruses; an influenza A (H1N1) virus, an influenza A (H3N2) virus, and an influenza B virus. In addition, there are flu vaccines made to protect against four flu viruses (called "quadrivalent" vaccines). These vaccines protect against the same viruses as the trivalent vaccine and an additional B virus.  We recommend the high dose influenza vaccine to those 65 years and older.  Hepatitis B vaccine to protect against a form of infection of the liver by a virus acquired from blood or  body fluids, particularly for high risk groups.  Td or Tdap vaccine to protect against Tetanus, diphtheria and pertussis which can be very serious.  These diseases are caused by bacteria.  Diphtheria and pertussis are spread from person to person through coughing or sneezing.  Tetanus enters the body through cuts, scratches, or wounds.  Tetanus (Lockjaw) causes painful muscle tightening and stiffness, usually all over the body.  Diphtheria can cause a thick coating to form in the back of the throat.  It can lead to breathing problems, paralysis, heart failure, and  death.  Pertussis (Whooping Cough) causes severe coughing spells, which can cause difficulty breathing, vomiting and disturbed sleep.  Td or Tdap is usually given every 10 years.  Shingles vaccine to protect against Varicella Zoster if you are older than age 72, or younger than 79 years old with certain underlying illness.    Cancer Screening: Most routine colon cancer screening begins at the age of 2.  Subsequent colonoscopies are performed either every 5-10 years for normal screening, or every 2-5 years for higher risks patients, up until age 60 years of age. Annual screening is done with easy to use take-home tests to check for hidden blood in the stool called hemoccult tests.  Sigmoidoscopy or colonoscopy can detect the earliest forms of colon cancer and is life saving. These tests use a small camera at the end of a tube to directly examine the colon.   Pelvic Exam and Pap Smear: Pelvic exams and pap smears are performed routinely to evaluate for abnormalities as well as cancers including cervical and vaginal cancers.  This is generally performed every 2-3 years for most women, or more frequently for higher risk patients.  Mammograms: Mammograms are used to screen for breast cancer.  Medicare covers baseline screening once from ages 28-51 years old, but will cover mammograms yearly for those 40 years and older.  In accordance with other  guidelines, you may not need a mammogram every year though.  The decision on how frequently you need a mammogram should be discussed with you medical provider.    Osteoporosis Screening: Screening for osteoporosis usually begins at age 70 for women, and can be done as frequent as every 2 years.  However, women or men with higher risk of osteoporosis may be screened earlier than age 68.  Osteoporosis or low bone mass is diminished bone strength from alterations in bone architecture leading to bone fragility and increased fracture risk.     Cardiovascular Screening: Fat and cholesterol leaves deposits in your arteries that can block them. This causes heart disease and vessel disease elsewhere in your body.  If your cholesterol is found to be high, or if you have heart disease or certain other medical conditions, then you may need to have your cholesterol monitored frequently and be treated with medication. Cardiovascular screening in the form of lab tests for cholesterol, HDL and triglycerides can be done every 5 years.  A screening electrocardiogram can be done as part of the Welcome to Medicare physical.  Diabetes Screening: Diabetes screening can be done at least every 3 years for those with risk factors,  or every 6-16months for prediabetic patients.  Screening includes fasting blood sugar test or glucose tolerance test.  Risk factors include hypertension, dyslipidemia, obesity, previously abnormal glucose tests, family history of diabetes, age 44 years or older, and history of gestations diabetes.   AAA (abdominal aortic aneurysm) Screening: Medicare allows for a one time ultrasound to screen for abdominal aortic aneurysm if done as a referral as part of the Welcome to Medicare exam.  Men eligible for this screening include those men between age 57-47 years of age who have smoked at least 100 cigarettes in his lifetime and/or has a family history of AAA.  HIV Screening:  Medicare allows for yearly  screening for patients at high risk for contracting HIV disease.

## 2016-08-22 NOTE — Progress Notes (Signed)
Melissa Ferrell is a 81 y.o. female who presents for annual wellness visit and follow-up on chronic medical conditions.  She has the following concerns: Fatigue - ongoing and unchanged. States she has been worked up for this by multiple providers and no one has been able to find the cause.  Requests refill of alprazolam. States she does not abuse this and only takes it as needed.   History of hyperlipidemia- intolerant to statins.  Osteopenia- per most recent DEXA. States she has been told she was osteoporotic in the past and has been intolerant to bisphosphonates. Is taking calcium and vitamin D.  HTN- checks her BP at home and readings have been close to goal range.  COPD- followed by Dr. Lake Bells.    Immunization History  Administered Date(s) Administered  . Influenza Whole 10/11/2006, 10/03/2010, 09/24/2011, 09/18/2012  . Influenza, High Dose Seasonal PF 08/29/2014  . Influenza-Unspecified 09/14/2013, 09/07/2015  . Pneumococcal Conjugate-13 05/01/2013  . Pneumococcal Polysaccharide-23 10/11/2006  . Td 09/18/2006  . Tdap 08/04/2015   Last Pap smear: menopause at age 59.  Last mammogram: 2013. refuses  Last colonoscopy: last year Cologuard.  Last DEXA: 2016. broken bone in 2016 by "just leaning over".  Dentist: up to date Ophtho: due in October  Exercise: does Wii fit.   No recent falls.   Other doctors caring for patient include: Dr. Lake Bells- pulmonologist every 6 months. Dr. Stanford Breed- cardiologist. Dr. Ellie Lunch- [facility]. Dr. Ubaldo Glassing  Dermatologist. Dr. Scot Dock- vascular surgeon   Depression screen:  See questionnaire below.  Depression screen [facility] 2/9 08/22/2016 08/03/2015 03/05/2015 11/14/2013 05/15/2012  Decreased Interest 0 0 0 0 0  Down, Depressed, Hopeless 0 0 0 0 1  PHQ - 2 Score 0 0 0 0 1    Fall Risk Screen: see questionnaire below. Fall Risk  08/22/2016 08/03/2015 03/05/2015 11/14/2013 05/15/2012  Falls in the past year? No No No No Yes  Number falls in past yr: - - -  - 2 or more  Risk Factor Category  - - - - High Fall Risk  Risk for fall due to : - - - - History of fall(s);Impaired balance/gait;Medication side effect;Other (Comment)  Risk for fall due to: Comment - - - - knee weakness    ADL screen:  See questionnaire below Functional Status Survey: Is the patient deaf or have difficulty hearing?: No Does the patient have difficulty seeing, even when wearing glasses/contacts?: No Does the patient have difficulty concentrating, remembering, or making decisions?: No Does the patient have difficulty walking or climbing stairs?: No Does the patient have difficulty dressing or bathing?: No Does the patient have difficulty doing errands alone such as visiting a doctor's office or shopping?: No   End of Life Discussion:  Patient has a living will and medical power of attorney  Review of Systems Constitutional: -fever, -chills, -sweats, -unexpected weight change, -anorexia, -fatigue Allergy: -sneezing, -itching, -congestion Dermatology: denies changing moles, rash, lumps, new worrisome lesions ENT: -runny nose, -ear pain, -sore throat, -hoarseness, -sinus pain, -teeth pain, -tinnitus, -hearing loss, -epistaxis Cardiology:  -chest pain, -palpitations, -edema, -orthopnea, -paroxysmal nocturnal dyspnea Respiratory: -cough, -shortness of breath, -dyspnea on exertion, -wheezing, -hemoptysis Gastroenterology: -abdominal pain, -nausea, -vomiting, -diarrhea, -constipation, -blood in stool, -changes in bowel movement, -dysphagia Hematology: -bleeding or bruising problems Musculoskeletal: -arthralgias, -myalgias, -joint swelling, -back pain, -neck pain, -cramping, -gait changes Ophthalmology: -vision changes, -eye redness, -itching, -discharge Urology: -dysuria, -difficulty urinating, -hematuria, -urinary frequency, -urgency, incontinence Neurology: -headache, -weakness, -tingling, -numbness, -speech abnormality, -memory loss, -falls, -  dizziness Psychology:   -depressed mood, -agitation, -sleep problems    PHYSICAL EXAM:  BP 126/80   Pulse 68   Ht 5' 3.25" (1.607 m)   Wt 198 lb 3.2 oz (89.9 kg)   BMI 34.83 kg/m   General Appearance: Alert, cooperative, no distress, appears stated age Head: Normocephalic, without obvious abnormality, atraumatic Eyes: PERRL, conjunctiva/corneas clear, EOM's intact, fundi benign Ears: Normal TM's and external ear canals Nose: Nares normal, mucosa normal, no drainage or sinus tenderness Throat: Lips, mucosa, and tongue normal; teeth and gums normal Neck: Supple, no lymphadenopathy; thyroid: no enlargement/tenderness/nodules; no carotid bruit or JVD Back: Spine nontender, no curvature, ROM normal, no CVA tenderness Lungs: Clear to auscultation bilaterally without wheezes, rales or ronchi; respirations unlabored Chest Wall: No tenderness or deformity Heart: Regular rate and rhythm, S1 and S2 normal, no murmur, rub or gallop Breast Exam: No tenderness, masses, or nipple discharge or inversion. No axillary lymphadenopathy Abdomen: Soft, non-tender, nondistended, normoactive bowel sounds, no masses, no hepatosplenomegaly Genitalia: refused  Rectal: refused  Extremities: No clubbing, cyanosis or edema Pulses: 2+ and symmetric all extremities Skin: Skin color, texture, turgor normal, no rashes or lesions Lymph nodes: Cervical, supraclavicular, and axillary nodes normal Neurologic: CNII-XII intact, normal strength, sensation and gait; reflexes 2+ and symmetric throughout Psych: Normal mood, affect, hygiene and grooming.  ASSESSMENT/PLAN: Medicare annual wellness visit, subsequent  Osteopenia determined by x-ray  Hyperlipidemia, unspecified hyperlipidemia type - Plan: Lipid panel  Generalized anxiety disorder - Plan: ALPRAZolam (XANAX) 0.25 MG tablet  Fatigue, unspecified type - Plan: CBC with Differential/Platelet, COMPLETE METABOLIC PANEL WITH GFR, TSH, VITAMIN D 25 Hydroxy (Vit-D Deficiency,  Fractures)  She appears to be doing well and is in good spirits.  States she is not interested in mammogram, colonoscopy and would not choose to do any treatment if something was found.  Discussed that her level of fatigue is chronic. Will check labs to look for etiology.  She is aware of the risks of alprazolam and has been taking this for many years. Will refill this for her.  Discussed continued calcium, vitamin D and weight bearing exercises to prevent worsening bone loss.  Counseled on fall prevention.  She is up to date on immunizations except for shingrix. Refuses shingles vaccine.  Will follow up pending labs.    Discussed monthly self breast exams and yearly mammograms; at least 30 minutes of aerobic activity at least 5 days/week and weight-bearing exercise 2x/week; proper sunscreen use reviewed; healthy diet, including goals of calcium and vitamin D intake and alcohol recommendations (less than or equal to 1 drink/day) reviewed; regular seatbelt use; changing batteries in smoke detectors.  Immunization recommendations discussed.  Colonoscopy recommendations reviewed   Medicare Attestation I have personally reviewed: The patient's medical and social history Their use of alcohol, tobacco or illicit drugs Their current medications and supplements The patient's functional ability including ADLs,fall risks, home safety risks, cognitive, and hearing and visual impairment Diet and physical activities Evidence for depression or mood disorders  The patient's weight, height, and BMI have been recorded in the chart.  I have made referrals, counseling, and provided education to the patient based on review of the above and I have provided the patient with a written personalized care plan for preventive services.     Harland Dingwall, NP-C   08/22/2016

## 2016-08-23 LAB — VITAMIN D 25 HYDROXY (VIT D DEFICIENCY, FRACTURES): VIT D 25 HYDROXY: 36 ng/mL (ref 30–100)

## 2016-08-24 ENCOUNTER — Other Ambulatory Visit: Payer: Self-pay | Admitting: Family Medicine

## 2016-08-24 ENCOUNTER — Encounter: Payer: Self-pay | Admitting: Family Medicine

## 2016-08-24 DIAGNOSIS — R7989 Other specified abnormal findings of blood chemistry: Secondary | ICD-10-CM

## 2016-08-24 DIAGNOSIS — N183 Chronic kidney disease, stage 3 unspecified: Secondary | ICD-10-CM

## 2016-08-24 DIAGNOSIS — I10 Essential (primary) hypertension: Secondary | ICD-10-CM

## 2016-08-24 HISTORY — DX: Chronic kidney disease, stage 3 unspecified: N18.30

## 2016-08-24 NOTE — Progress Notes (Signed)
   Subjective:    Patient ID: Melissa Ferrell, female    DOB: [birth year], 81 y.o.   MRN: [id number]  HPI    Review of Systems     Objective:   Physical Exam        Assessment & Plan:

## 2016-09-01 ENCOUNTER — Encounter: Payer: Self-pay | Admitting: Family Medicine

## 2016-09-01 DIAGNOSIS — Z23 Encounter for immunization: Secondary | ICD-10-CM | POA: Diagnosis not present

## 2016-09-07 ENCOUNTER — Encounter: Payer: Self-pay | Admitting: Internal Medicine

## 2016-09-09 ENCOUNTER — Encounter: Payer: Self-pay | Admitting: Cardiology

## 2016-09-24 IMAGING — CR DG RIBS W/ CHEST 3+V*R*
3 series · 3 of 3 positions shown · non-contrast
Comparison: Chest x-ray 11/05/2010.

CLINICAL DATA: Right-sided chest pain when bending over.

EXAM:
RIGHT RIBS AND CHEST - 3+ VIEW

[PA]
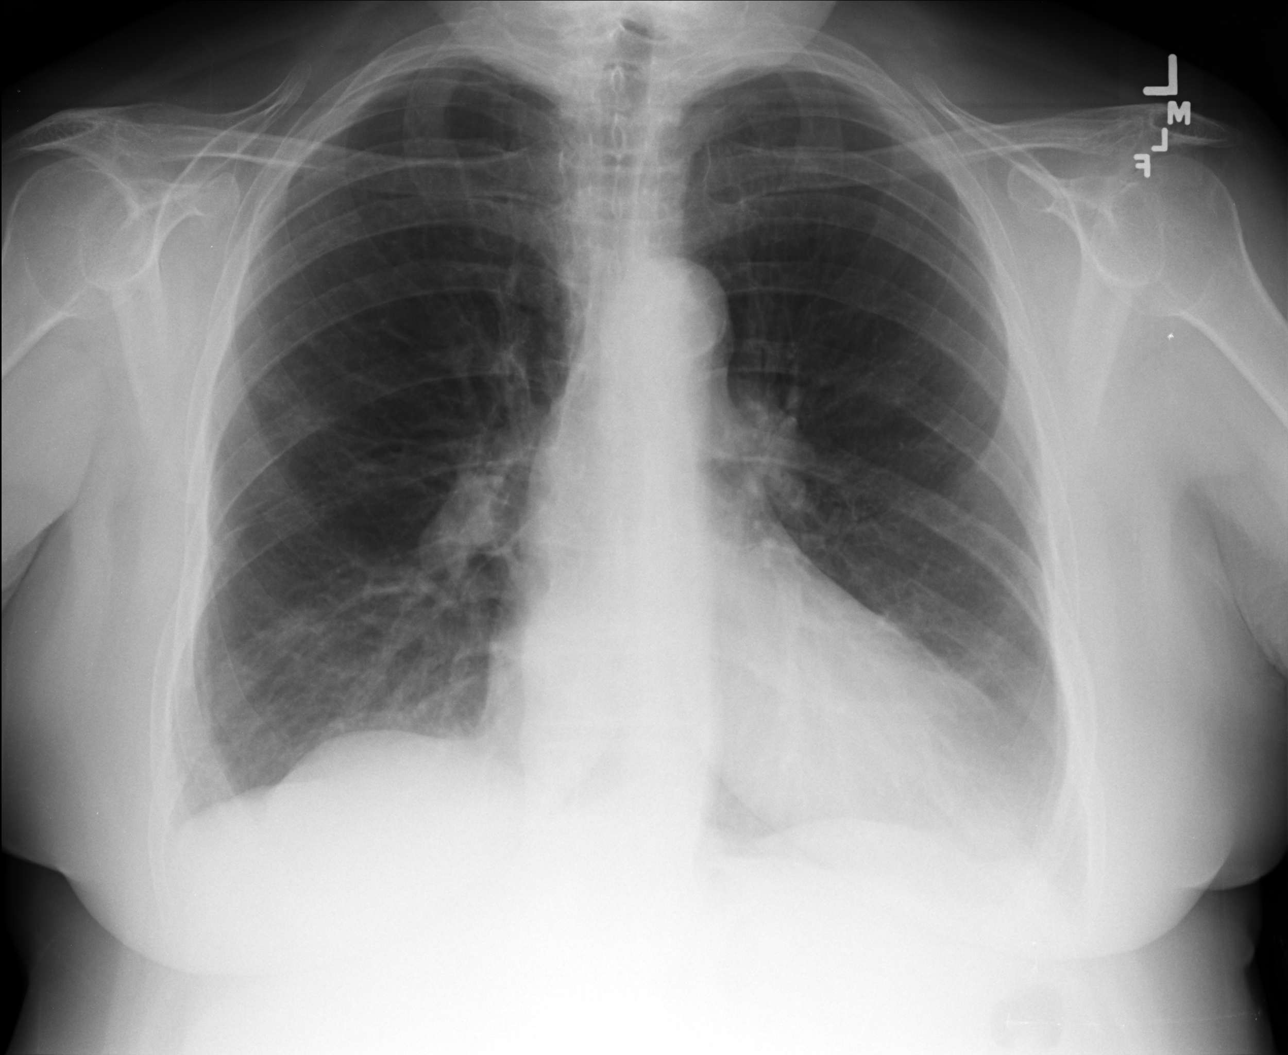

[AP (1 of 2)]
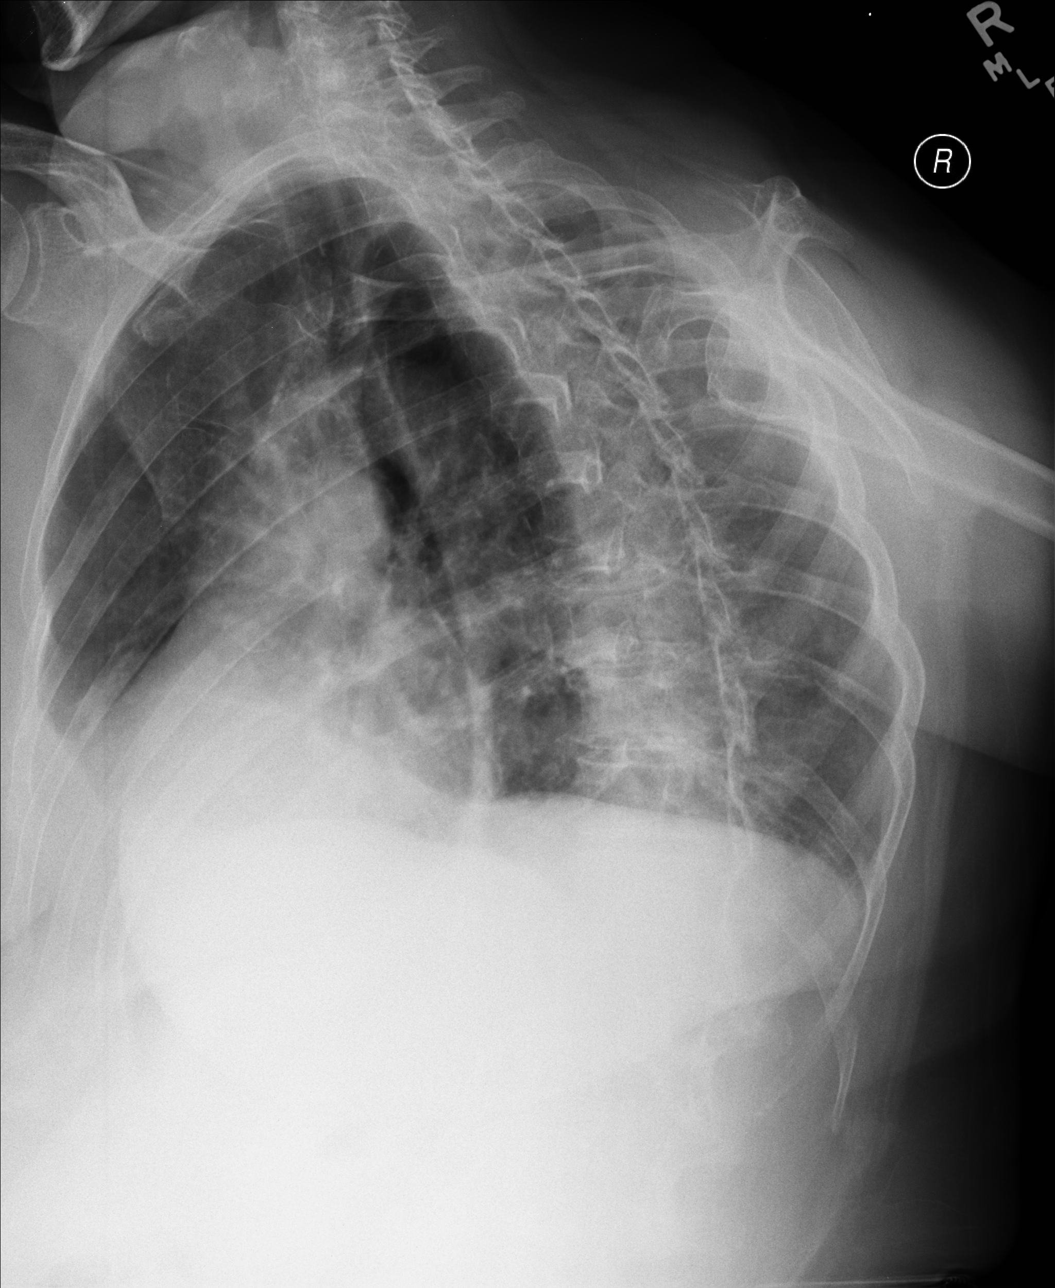

[AP (2 of 2)]
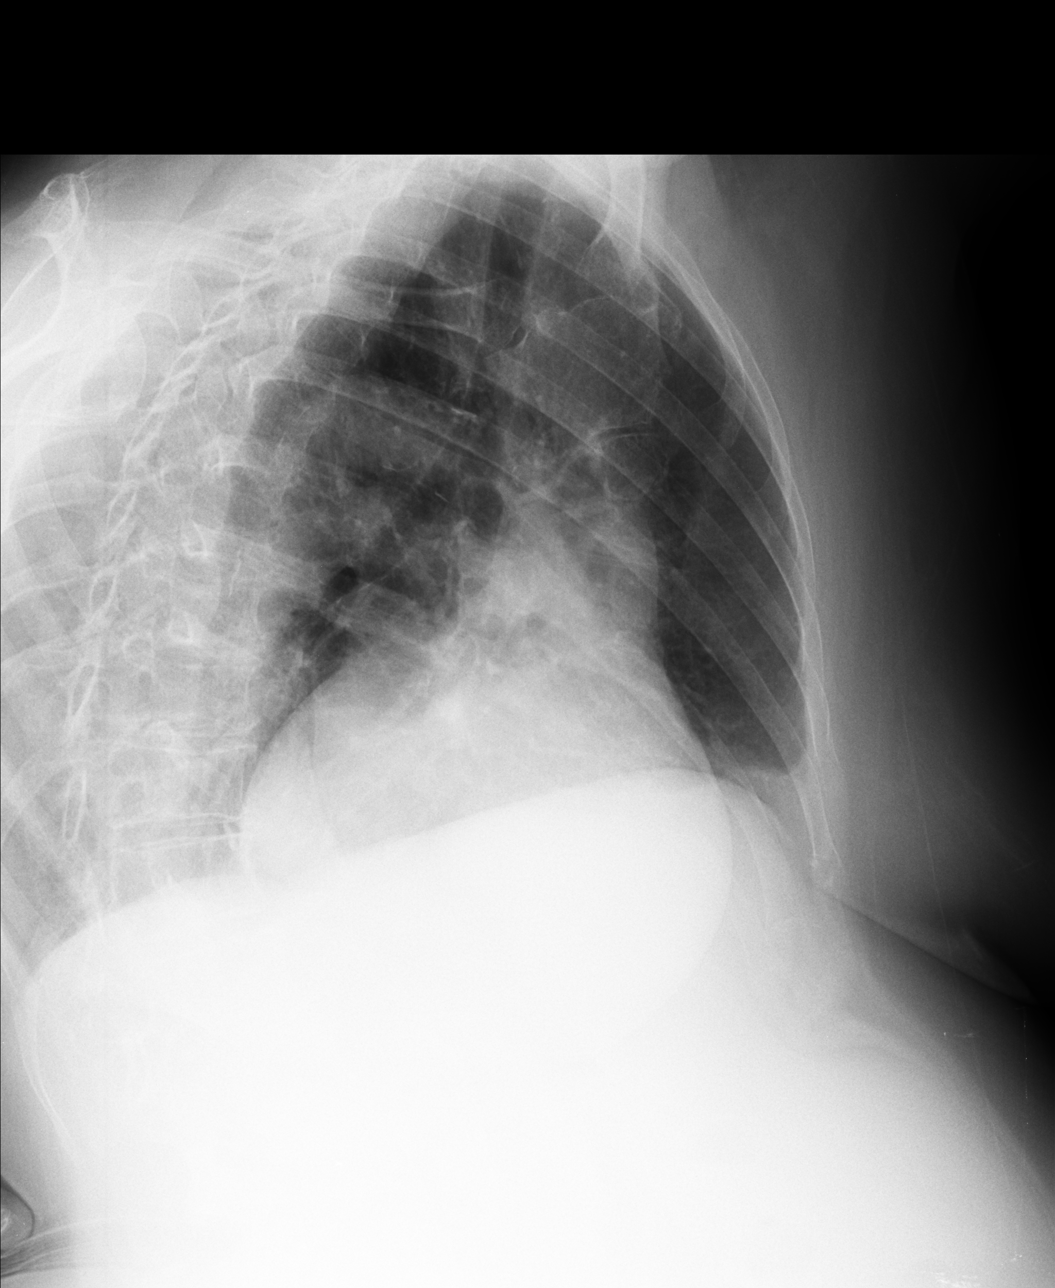

[3 of 3 positions shown; findings below may reference images not displayed]

FINDINGS: The cardiac silhouette, mediastinal and hilar contours are stable.
Mild cardiac enlargement is unchanged. There is tortuosity and
calcification of the thoracic aorta. The pulmonary hila appears
stable. There are chronic basilar scarring changes and eventration
of both hemidiaphragms but no infiltrates, effusions or
pneumothorax.

Dedicated views of the right ribs demonstrate a suspected subtle
right anterior sixth rib fracture.
IMPRESSION: No acute cardiopulmonary findings. Chronic basilar scarring changes.

Suspect subtle nondisplaced right sixth rib fracture.

## 2016-09-26 ENCOUNTER — Ambulatory Visit (INDEPENDENT_AMBULATORY_CARE_PROVIDER_SITE_OTHER)
Admission: RE | Admit: 2016-09-26 | Discharge: 2016-09-26 | Disposition: A | Discharge status: Home / Self Care | Payer: Medicare Other | Source: Ambulatory Visit | Attending: Pulmonary Disease | Admitting: Pulmonary Disease

## 2016-09-26 ENCOUNTER — Encounter: Payer: Self-pay | Admitting: Pulmonary Disease

## 2016-09-26 ENCOUNTER — Ambulatory Visit (INDEPENDENT_AMBULATORY_CARE_PROVIDER_SITE_OTHER): Payer: Medicare Other | Admitting: Pulmonary Disease

## 2016-09-26 VITALS — BP 134/76 | HR 69 | Ht 64.0 in | Wt 194.0 lb

## 2016-09-26 DIAGNOSIS — J432 Centrilobular emphysema: Secondary | ICD-10-CM | POA: Diagnosis not present

## 2016-09-26 DIAGNOSIS — R05 Cough: Secondary | ICD-10-CM | POA: Diagnosis not present

## 2016-09-26 DIAGNOSIS — R059 Cough, unspecified: Secondary | ICD-10-CM

## 2016-09-26 MED ORDER — TIOTROPIUM BROMIDE MONOHYDRATE 18 MCG IN CAPS: 18.0000 ug | ORAL_CAPSULE | Freq: Every day | RESPIRATORY_TRACT | 11 refills | Status: AC

## 2016-09-26 MED ORDER — ALBUTEROL SULFATE HFA 108 (90 BASE) MCG/ACT IN AERS: 2.0000 | INHALATION_SPRAY | Freq: Four times a day (QID) | RESPIRATORY_TRACT | 6 refills | Status: AC | PRN

## 2016-09-26 MED ORDER — DOXYCYCLINE HYCLATE 100 MG PO TABS: 100.0000 mg | ORAL_TABLET | Freq: Two times a day (BID) | ORAL | 1 refills | Status: DC

## 2016-09-26 NOTE — Patient Instructions (Addendum)
For your COPD: We will write an Rx for ventolin to use on an as needed basis Keep taking Spiriva  For the chest pressure and new cough: We will check a chest x-ray If your symptoms do not improve after seeing nephrology, then I recommend you try saline sinus rinses  We will see you back in 6-12 months or sooner if needed

## 2016-09-26 NOTE — Progress Notes (Signed)
Subjective:    Patient ID: Melissa Ferrell, female    DOB: December 06, 1935, 81 y.o.   MRN: [id number]  Synopsis: COPD followed by Dr. Gwenette Greet prior to 06/2014; she has a history of rheumatoid arthritis. She previously smoked 3 ppd and quit in 1986.    HPI Chief Complaint  Patient presents with  . Follow-up    pt c/o worsening sob worse with exertion and when sleeping.  also notes r sided chest tightness worse when sleeping, and nonprod cough.     She was recently diagnosed with stage III CKD.  She is supposed to see nephrology tomorrow. She is feeling more dyspneic, waking up at night to use her inhaler more. She would like to have ventolin filled. She takes proAir frequently a tnight for chest pressure.  She says that she has been having more sinus trouble lately as well.    She notes that she has been using the proAir night 3-5 times per week for chest pressure and dyspnea.  She says that the proAir helps. She also has a dry cough.  She just had her HVac cleaned last week.   She says her symptoms have improved (sinuses) since having the HVac cleaned.  There has not really been a change in her ability to get around and her day to day breathing.     Past Medical History:  Diagnosis Date  . Anxiety and depression   . Carotid artery disease (HCC)    hx CAD, dCHF listed in [place] - sees cardiologist, vasc and neurologist  . CKD (chronic kidney disease) stage 3, GFR 30-59 ml/min 08/24/2016  . COPD (chronic obstructive pulmonary disease) [facility])    sees pulmonology  . DDD (degenerative disc disease)   . Disseminated superficial actinic porokeratosis    bilateral calves  . Fracture, spinal   . GERD (gastroesophageal reflux disease)    hx PUD 2ndary to NSAIDs 1975  . Hemorrhoids   . HTN (hypertension)   . Hx of adenomatous colonic polyps   . Hyperlipidemia    intol to statins  . IBS (irritable bowel syndrome)   . Memory loss    seen by neurology in 2015 and referred to neuropsych for  anxiety  . Migraine headache   . Osteopenia determined by x-ray   . RHEUMATIC FEVER, HX OF 10/15/2006   Qualifier: Diagnosis of  By: Jenny Reichmann MD, Hunt Oris   . Rheumatoid arthritis(714.0)    per her report, in remission for many years per her report  . Varicose veins    hx LE edema      Review of Systems  Constitutional: Negative for diaphoresis, fatigue and fever.  HENT: Negative for rhinorrhea, sinus pressure and sneezing.   Respiratory: Negative for cough, shortness of breath and wheezing.   Cardiovascular: Negative for chest pain, palpitations and leg swelling.       Objective:   Physical Exam Vitals:   09/26/16 1328  BP: 134/76  Pulse: 69  SpO2: 96%  Weight: 194 lb (88 kg)  Height: 5\' 4"  (1.626 m)   RA  Gen: well appearing HENT: OP clear, TM's clear, neck supple PULM: CTA B, normal percussion CV: RRR, no mgr, trace edema GI: BS+, soft, nontender Derm: no cyanosis or rash Psyche: normal mood and affect   PFT:  PFT's 09/12/2011:  FEV1  1.37 (79%) ratio 47, FEV1 increased 14% with BD, +++ airtrapping, DLCO 60% August 2016 pulmonary function testing> ratio 58%, FEV1 1.34 L (69% predicted, 20% change with broncho-dilator),  total lung capacity 4.87 L (99% predicted), DLCO 15.96 (69% predicted) sees.  Records from her May 2018 cardiology visit and reviewed were she was continued on her antihypertensive regimen     Assessment & Plan:  Cough - Plan: DG Chest 2 View  Centrilobular emphysema [facility])  Discussion: This is been a stable interval and that she has not had an exacerbation of her COPD, however she does describe nocturnal chest pressure while recumbent. I think this is likely related to her chronic kidney disease and volume overload. She follows up with nephrology tomorrow so I would like to defer to them for diuretic use.  She had her flu shot already this year.  She does have a new cough which seems to be related to postnasal drip from allergic rhinitis. Since  having her HVAC service this seems to have improved.  Plan: For your COPD: We will write an Rx for ventolin to use on an as needed basis Keep taking Spiriva  For the chest pressure and new cough: We will check a chest x-ray If your symptoms do not improve after seeing nephrology, then I recommend you try saline sinus rinses  We will see you back in 6-12 months or sooner if needed    Current Outpatient Prescriptions:  .  ALPRAZolam (XANAX) 0.25 MG tablet, TAKE 1/2 TABLET BY MOUTH AS NEEDED FOR SEVERE ANXIETY OR PANIC. NOT MORE THEN EVERY 12 HOURS, Disp: 20 tablet, Rfl: 0 .  aspirin 81 MG tablet, Take 81 mg by mouth 2 (two) times daily. , Disp: , Rfl:  .  Carboxymethylcellul-Glycerin (OPTIVE) 0.5-0.9 % SOLN, Apply to eye as needed. Refresh optive (no preservative), Disp: , Rfl:  .  carvedilol (COREG) 12.5 MG tablet, Take 1.5 tablets (18.75 mg total) by mouth 2 (two) times daily with a meal., Disp: 270 tablet, Rfl: 3 .  Flaxseed, Linseed, (FLAXSEED OIL) 1000 MG CAPS, Take 1 capsule by mouth daily at 12 noon. , Disp: , Rfl:  .  folic acid (FOLVITE) 341 MCG tablet, Take 400 mcg by mouth daily., Disp: , Rfl:  .  hydrochlorothiazide (HYDRODIURIL) 50 MG tablet, Take 25 mg by mouth daily., Disp: , Rfl:  .  KRILL OIL PO, Take 500 mg by mouth., Disp: , Rfl:  .  Lecithin 1200 MG CAPS, Take 1,200 mg by mouth every morning. , Disp: , Rfl:  .  losartan (COZAAR) 50 MG tablet, TAKE 1 TABLET(50 MG) BY MOUTH TWICE DAILY, Disp: 180 tablet, Rfl: 0 .  Magnesium 250 MG TABS, Take 1 tablet by mouth every morning. , Disp: , Rfl:  .  Multiple Vitamins-Minerals (CENTRUM SILVER PO), Take 1 tablet by mouth every morning. , Disp: , Rfl:  .  multivitamin-lutein (OCUVITE-LUTEIN) CAPS capsule, Take 1 capsule by mouth daily., Disp: , Rfl:  .  tiotropium (SPIRIVA) 18 MCG inhalation capsule, Place 1 capsule (18 mcg total) into inhaler and inhale daily., Disp: 30 capsule, Rfl: 11 .  vitamin B-12 (CYANOCOBALAMIN) 500 MCG  tablet, Take 500 mcg by mouth every morning. , Disp: , Rfl:  .  albuterol (PROVENTIL HFA;VENTOLIN HFA) 108 (90 Base) MCG/ACT inhaler, Inhale 2 puffs into the lungs every 6 (six) hours as needed for wheezing or shortness of breath., Disp: 1 Inhaler, Rfl: 6 .  doxycycline (VIBRA-TABS) 100 MG tablet, Take 1 tablet (100 mg total) by mouth 2 (two) times daily., Disp: 10 tablet, Rfl: 1

## 2016-09-27 ENCOUNTER — Other Ambulatory Visit: Payer: Self-pay | Admitting: Cardiology

## 2016-09-27 DIAGNOSIS — Z6832 Body mass index (BMI) 32.0-32.9, adult: Secondary | ICD-10-CM | POA: Diagnosis not present

## 2016-09-27 DIAGNOSIS — N183 Chronic kidney disease, stage 3 (moderate): Secondary | ICD-10-CM | POA: Diagnosis not present

## 2016-09-27 NOTE — Telephone Encounter (Signed)
REFILL 

## 2016-10-03 ENCOUNTER — Encounter: Payer: Self-pay | Admitting: Family Medicine

## 2016-10-03 ENCOUNTER — Other Ambulatory Visit: Payer: Self-pay | Admitting: Nephrology

## 2016-10-03 ENCOUNTER — Ambulatory Visit (INDEPENDENT_AMBULATORY_CARE_PROVIDER_SITE_OTHER): Payer: Medicare Other | Admitting: Family Medicine

## 2016-10-03 VITALS — BP 165/75 | HR 69 | Temp 98.3°F | Resp 16 | Ht 64.0 in | Wt 193.8 lb

## 2016-10-03 DIAGNOSIS — N183 Chronic kidney disease, stage 3 unspecified: Secondary | ICD-10-CM

## 2016-10-03 DIAGNOSIS — S39012A Strain of muscle, fascia and tendon of lower back, initial encounter: Secondary | ICD-10-CM | POA: Diagnosis not present

## 2016-10-03 DIAGNOSIS — M545 Low back pain, unspecified: Secondary | ICD-10-CM

## 2016-10-03 DIAGNOSIS — Z6832 Body mass index (BMI) 32.0-32.9, adult: Secondary | ICD-10-CM

## 2016-10-03 NOTE — Patient Instructions (Signed)
You appear to have a strain of your low back. I do not see a reason for xray today. Ok to use tylenol over the counter, heat or ice as needed and try to keep moving with gentle range of motion and stretches. Return to the clinic or go to the nearest emergency room if any of your symptoms worsen or new symptoms occur.    Low Back Strain A strain is a stretch or tear in a muscle or the strong cords of tissue that attach muscle to bone (tendons). Strains of the lower back (lumbar spine) are a common cause of low back pain. A strain occurs when muscles or tendons are torn or are stretched beyond their limits. The muscles may become inflamed, resulting in pain and sudden muscle tightening (spasms). A strain can happen suddenly due to an injury (trauma), or it can develop gradually due to overuse. There are three types of strains:  Grade 1 is a mild strain involving a minor tear of the muscle fibers or tendons. This may cause some pain but no loss of muscle strength.  Grade 2 is a moderate strain involving a partial tear of the muscle fibers or tendons. This causes more severe pain and some loss of muscle strength.  Grade 3 is a severe strain involving a complete tear of the muscle or tendon. This causes severe pain and complete or nearly complete loss of muscle strength.  What are the causes? This condition may be caused by:  Trauma, such as a fall or a hit to the body.  Twisting or overstretching the back. This may result from doing activities that require a lot of energy, such as lifting heavy objects.  What increases the risk? The following factors may increase your risk of getting this condition:  Playing contact sports.  Participating in sports or activities that put excessive stress on the back and require a lot of bending and twisting, including: ? Lifting weights or heavy objects. ? Gymnastics. ? Soccer. ? Figure skating. ? Snowboarding.  Being overweight or obese.  Having poor  strength and flexibility.  What are the signs or symptoms? Symptoms of this condition may include:  Sharp or dull pain in the lower back that does not go away. Pain may extend to the buttocks.  Stiffness.  Limited range of motion.  Inability to stand up straight due to stiffness or pain.  Muscle spasms.  How is this diagnosed? This condition may be diagnosed based on:  Your symptoms.  Your medical history.  A physical exam. ? Your health care provider may push on certain areas of your back to determine the source of your pain. ? You may be asked to bend forward, backward, and side to side to assess the severity of your pain and your range of motion.  Imaging tests, such as: ? X-rays. ? MRI.  How is this treated? Treatment for this condition may include:  Applying heat and cold to the affected area.  Medicines to help relieve pain and to relax your muscles (muscle relaxants).  NSAIDs to help reduce swelling and discomfort.  Physical therapy.  When your symptoms improve, it is important to gradually return to your normal routine as soon as possible to reduce pain, avoid stiffness, and avoid loss of muscle strength. Generally, symptoms should improve within 6 weeks of treatment. However, recovery time varies. Follow these instructions at home: Managing pain, stiffness, and swelling  If directed, apply ice to the injured area during the first 24  hours after your injury. ? Put ice in a plastic bag. ? Place a towel between your skin and the bag. ? Leave the ice on for 20 minutes, 2-3 times a day.  If directed, apply heat to the affected area as often as told by your health care provider. Use the heat source that your health care provider recommends, such as a moist heat pack or a heating pad. ? Place a towel between your skin and the heat source. ? Leave the heat on for 20-30 minutes. ? Remove the heat if your skin turns bright red. This is especially important if you  are unable to feel pain, heat, or cold. You may have a greater risk of getting burned. Activity  Rest and return to your normal activities as told by your health care provider. Ask your health care provider what activities are safe for you.  Avoid activities that take a lot of effort (are strenuous) for as long as told by your health care provider.  Do exercises as told by your health care provider. General instructions   Take over-the-counter and prescription medicines only as told by your health care provider.  If you have questions or concerns about safety while taking pain medicine, talk with your health care provider.  Do not drive or operate heavy machinery until you know how your pain medicine affects you.  Do not use any tobacco products, such as cigarettes, chewing tobacco, and e-cigarettes. Tobacco can delay bone healing. If you need help quitting, ask your health care provider.  Keep all follow-up visits as told by your health care provider. This is important. How is this prevented?  Warm up and stretch before being active.  Cool down and stretch after being active.  Give your body time to rest between periods of activity.  Avoid: ? Being physically inactive for long periods at a time. ? Exercising or playing sports when you are tired or in pain.  Use correct form when playing sports and lifting heavy objects.  Use good posture when sitting and standing.  Maintain a healthy weight.  Sleep on a mattress with medium firmness to support your back.  Make sure to use equipment that fits you, including shoes that fit well.  Be safe and responsible while being active to avoid falls.  Do at least 150 minutes of moderate-intensity exercise each week, such as brisk walking or water aerobics. Try a form of exercise that takes stress off your back, such as swimming or stationary cycling.  Maintain physical fitness, including: ? Strength. ? Flexibility. ? Cardiovascular  fitness. ? Endurance. Contact a health care provider if:  Your back pain does not improve after 6 weeks of treatment.  Your symptoms get worse. Get help right away if:  Your back pain is severe.  You are unable to stand or walk.  You develop pain in your legs.  You develop weakness in your buttocks or legs.  You have difficulty controlling when you urinate or when you have a bowel movement. This information is not intended to replace advice given to you by your health care provider. Make sure you discuss any questions you have with your health care provider. Document Released: 01/03/2005 Document Revised: 09/10/2015 Document Reviewed: 10/15/2014 Elsevier Interactive Patient Education  2017 [employer].     IF you received an x-ray today, you will receive an invoice from [facility]. Please contact [facility] at [phone] with questions or concerns regarding your invoice.   IF you received  labwork today, you will receive an invoice from [facility] [employer] [facility]. Please contact [facility] at [phone] with questions or concerns regarding your invoice.   Our billing staff will not be able to assist you with questions regarding bills from these companies.  You will be contacted with the lab results as soon as they are available. The fastest way to get your results is to activate your My Chart account. Instructions are located on the last page of this paperwork. If you have not heard from [place] regarding the results in 2 weeks, please contact this office.

## 2016-10-03 NOTE — Progress Notes (Signed)
Subjective:  By signing my name below, I, Melissa Ferrell, attest that this documentation has been prepared under the direction and in the presence of [name], MD Electronically Signed: Ladene Artist, ED Scribe 10/03/2016 at 9:51 AM.   Patient ID: Melissa Ferrell, female    DOB: 1935-10-11, 81 y.o.   MRN: [id number]  Chief Complaint  Patient presents with  . Back Pain    hurt lower back moving a plant    HPI Melissa Ferrell is a 81 y.o. female who presents to Primary Care at [facility] with a h/o multiple medical problems include CKD, generalized anxiety, COPD, CBD, osteopenia with t-score of -1.7 on Dexa scan February 2017. She presents with low back pain after moving her pepper plant 3 days ago. She reports stumbling backwards while moving her plant but denies falling. States she always has some aches in her back but the incident 3 days ago exacerbated the pain. She noticed gradual onset of right-sided low back pain that radiates into the right buttock and right thigh. She has tried a heating pad but no medications tried PTA. Denies fever, unexplained weight loss, bladder/bowel incontinence, saddle paraesthesia, weakness in LE.  Patient Active Problem List   Diagnosis Date Noted  . CKD (chronic kidney disease) stage 3, GFR 30-59 ml/min 08/24/2016  . Osteopenia determined by x-ray   . Generalized anxiety disorder 07/27/2015  . Depression and anxiety 12/02/2014  . Shortness of breath 07/01/2014  . PVD (peripheral vascular disease) ([place]) 05/02/2014  . Lower extremity edema 04/30/2014  . Varicose veins of lower extremities with other complications 28/41/3244  . Cerebrovascular disease 08/20/2010  . Hyperlipemia 10/15/2006  . MIGRAINE HEADACHE 10/15/2006  . Essential hypertension 10/15/2006  . COPD (chronic obstructive pulmonary disease) with emphysema ([place]) 10/15/2006   Past Medical History:  Diagnosis Date  . Anxiety and depression   . Carotid artery disease (HCC)    hx CAD,  dCHF listed in [place] - sees cardiologist, vasc and neurologist  . CKD (chronic kidney disease) stage 3, GFR 30-59 ml/min 08/24/2016  . COPD (chronic obstructive pulmonary disease) [facility])    sees pulmonology  . DDD (degenerative disc disease)   . Disseminated superficial actinic porokeratosis    bilateral calves  . Fracture, spinal   . GERD (gastroesophageal reflux disease)    hx PUD 2ndary to NSAIDs 1975  . Hemorrhoids   . HTN (hypertension)   . Hx of adenomatous colonic polyps   . Hyperlipidemia    intol to statins  . IBS (irritable bowel syndrome)   . Memory loss    seen by neurology in 2015 and referred to neuropsych for anxiety  . Migraine headache   . Osteopenia determined by x-ray   . RHEUMATIC FEVER, HX OF 10/15/2006   Qualifier: Diagnosis of  By: Jenny Reichmann MD, Hunt Oris   . Rheumatoid arthritis(714.0)    per her report, in remission for many years per her report  . Varicose veins    hx LE edema   Past Surgical History:  Procedure Laterality Date  . chronically infected lymph node removed  1973  . THYROGLOSSAL DUCT CYST     1943, 1944, 1973   Allergies  Allergen Reactions  . Qvar [Beclomethasone] Anaphylaxis  . Stiolto Respimat [Tiotropium Bromide-Olodaterol] Anaphylaxis  . Anoro Ellipta [Umeclidinium-Vilanterol]     HEADACHES  . Prednisone Other (See Comments)    Per patient: was told that she was not to take it by her early Rheumatology physician  . Atrovent [Ipratropium]   .  Azithromycin   . Bee Venom   . Benzocaine   . Breo Ellipta [Fluticasone Furoate-Vilanterol]     Headache, anxiety.   [facility] Hcl]   . Cefdinir   . Ciprofloxacin   . Diphenhydramine Hcl     "benadryl"  . Erythromycin   . Indomethacin   . Lasix [Furosemide]     Low tolerance    . Other     Adhesive tape  . Paba Derivatives   . Penicillins   . Statins     Low tolerance    . Tobramycin   . Zetia [Ezetimibe]     Low tolerance    Prior to Admission medications     Medication Sig Start Date End Date Taking? Authorizing Provider  albuterol (PROVENTIL HFA;VENTOLIN HFA) 108 (90 Base) MCG/ACT inhaler Inhale 2 puffs into the lungs every 6 (six) hours as needed for wheezing or shortness of breath. 09/26/16   Juanito Doom, MD  ALPRAZolam (XANAX) 0.25 MG tablet TAKE 1/2 TABLET BY MOUTH AS NEEDED FOR SEVERE ANXIETY OR PANIC. NOT MORE THEN EVERY 12 HOURS 08/22/16   Henson, Vickie L, NP-C  aspirin 81 MG tablet Take 81 mg by mouth 2 (two) times daily.     [provider]  Carboxymethylcellul-Glycerin (OPTIVE) 0.5-0.9 % SOLN Apply to eye as needed. Refresh optive (no preservative)    [provider]  carvedilol (COREG) 12.5 MG tablet TAKE 1 AND 1/2 TABLETS(18.75 MG) BY MOUTH TWICE DAILY WITH A MEAL 09/27/16   Lelon Perla, MD  doxycycline (VIBRA-TABS) 100 MG tablet Take 1 tablet (100 mg total) by mouth 2 (two) times daily. 09/26/16   Juanito Doom, MD  Flaxseed, Linseed, (FLAXSEED OIL) 1000 MG CAPS Take 1 capsule by mouth daily at 12 noon.     [provider]  folic acid (FOLVITE) 270 MCG tablet Take 400 mcg by mouth daily.    [provider]  hydrochlorothiazide (HYDRODIURIL) 50 MG tablet Take 25 mg by mouth daily.    [provider]  KRILL OIL PO Take 500 mg by mouth.    [provider]  Lecithin 1200 MG CAPS Take 1,200 mg by mouth every morning.     [provider]  losartan (COZAAR) 50 MG tablet TAKE 1 TABLET(50 MG) BY MOUTH TWICE DAILY 07/25/16   Lelon Perla, MD  Magnesium 250 MG TABS Take 1 tablet by mouth every morning.     [provider]  Multiple Vitamins-Minerals (CENTRUM SILVER PO) Take 1 tablet by mouth every morning.     [provider]  multivitamin-lutein (OCUVITE-LUTEIN) CAPS capsule Take 1 capsule by mouth daily.    [provider]  tiotropium (SPIRIVA) 18 MCG inhalation capsule Place 1 capsule (18 mcg total) into inhaler and inhale daily. 09/26/16    Juanito Doom, MD  vitamin B-12 (CYANOCOBALAMIN) 500 MCG tablet Take 500 mcg by mouth every morning.     [provider]   Social History   Social History  . Marital status: Divorced    Spouse name: N/A  . Number of children: 3  . Years of education: N/A   Occupational History  . Retired-homemaker    Social History Main Topics  . Smoking status: Former Smoker    Packs/day: 3.00    Years: 30.00    Types: Cigarettes    Quit date: 01/17/1985  . Smokeless tobacco: Never Used     Comment: previous 80 pack year history  . Alcohol use  No  . Drug use: No  . Sexual activity: No   Other Topics Concern  . Not on file   Social History Narrative   Work or School: volunteers at [employer] Situation: lives at [place]:       Lifestyle: no regular exercise; diet is ok         Review of Systems  Constitutional: Negative for fever and unexpected weight change.  Genitourinary: Negative for enuresis.  Musculoskeletal: Positive for back pain.  Neurological: Negative for weakness and numbness.      Objective:   Physical Exam  Constitutional: She is oriented to person, place, and time. She appears well-developed and well-nourished. No distress.  HENT:  Head: Normocephalic and atraumatic.  Eyes: Conjunctivae and EOM are normal.  Neck: Neck supple. No tracheal deviation present.  Cardiovascular: Normal rate.   Pulmonary/Chest: Effort normal. No respiratory distress.  Musculoskeletal:  No midline bony tenderness. Tenderness over R lower paraspinal of lumbar spine, SI joint and slight tender over sciatic notch. Sore with heel and toe walk but is able to toe walk. Flexion to 70-80 degrees. Minimal extension. Slight decrease to L lateral flexion. Negative seated straight leg raise. R hip pain free ROM.  Neurological: She is alert and oriented to person, place, and time.  Reflex Scores:      Patellar reflexes are 2+ on the right side and 2+  on the left side.      Achilles reflexes are 2+ on the right side and 2+ on the left side. Skin: Skin is warm and dry.  Psychiatric: She has a normal mood and affect. Her behavior is normal.  Nursing note and vitals reviewed.  Vitals:   10/03/16 0937  BP: (!) 165/75  Pulse: 69  Resp: 16  Temp: 98.3 F (36.8 C)  SpO2: 93%  Weight: 193 lb 12.8 oz (87.9 kg)  Height: 5\' 4"  (1.626 m)      Assessment & Plan:   Ger Nicks is a 81 y.o. female Acute right-sided low back pain without sciatica  Low back strain, initial encounter  - Reported history of chronic low back pain, now with strain/flare after moving hot at home. Did not notice pain initially, no red flags on exam or history, imaging deferred at present.  -Due to history of chronic kidney disease, will avoid NSAIDs, as well as muscle relaxants due to potential side effects at this point.  -Tylenol over-the-counter, heat or ice with gentle range of motion/stretches as tolerated, RTC precautions given  No orders of the defined types were placed in this encounter.  Patient Instructions   You appear to have a strain of your low back. I do not see a reason for xray today. Ok to use tylenol over the counter, heat or ice as needed and try to keep moving with gentle range of motion and stretches. Return to the clinic or go to the nearest emergency room if any of your symptoms worsen or new symptoms occur.    Low Back Strain A strain is a stretch or tear in a muscle or the strong cords of tissue that attach muscle to bone (tendons). Strains of the lower back (lumbar spine) are a common cause of low back pain. A strain occurs when muscles or tendons are torn or are stretched beyond their limits. The muscles may become inflamed, resulting in pain and sudden muscle tightening (spasms). A strain can happen suddenly due to  an injury (trauma), or it can develop gradually due to overuse. There are three types of strains:  Grade 1 is a mild  strain involving a minor tear of the muscle fibers or tendons. This may cause some pain but no loss of muscle strength.  Grade 2 is a moderate strain involving a partial tear of the muscle fibers or tendons. This causes more severe pain and some loss of muscle strength.  Grade 3 is a severe strain involving a complete tear of the muscle or tendon. This causes severe pain and complete or nearly complete loss of muscle strength.  What are the causes? This condition may be caused by:  Trauma, such as a fall or a hit to the body.  Twisting or overstretching the back. This may result from doing activities that require a lot of energy, such as lifting heavy objects.  What increases the risk? The following factors may increase your risk of getting this condition:  Playing contact sports.  Participating in sports or activities that put excessive stress on the back and require a lot of bending and twisting, including: ? Lifting weights or heavy objects. ? Gymnastics. ? Soccer. ? Figure skating. ? Snowboarding.  Being overweight or obese.  Having poor strength and flexibility.  What are the signs or symptoms? Symptoms of this condition may include:  Sharp or dull pain in the lower back that does not go away. Pain may extend to the buttocks.  Stiffness.  Limited range of motion.  Inability to stand up straight due to stiffness or pain.  Muscle spasms.  How is this diagnosed? This condition may be diagnosed based on:  Your symptoms.  Your medical history.  A physical exam. ? Your health care provider may push on certain areas of your back to determine the source of your pain. ? You may be asked to bend forward, backward, and side to side to assess the severity of your pain and your range of motion.  Imaging tests, such as: ? X-rays. ? MRI.  How is this treated? Treatment for this condition may include:  Applying heat and cold to the affected area.  Medicines to help  relieve pain and to relax your muscles (muscle relaxants).  NSAIDs to help reduce swelling and discomfort.  Physical therapy.  When your symptoms improve, it is important to gradually return to your normal routine as soon as possible to reduce pain, avoid stiffness, and avoid loss of muscle strength. Generally, symptoms should improve within 6 weeks of treatment. However, recovery time varies. Follow these instructions at home: Managing pain, stiffness, and swelling  If directed, apply ice to the injured area during the first 24 hours after your injury. ? Put ice in a plastic bag. ? Place a towel between your skin and the bag. ? Leave the ice on for 20 minutes, 2-3 times a day.  If directed, apply heat to the affected area as often as told by your health care provider. Use the heat source that your health care provider recommends, such as a moist heat pack or a heating pad. ? Place a towel between your skin and the heat source. ? Leave the heat on for 20-30 minutes. ? Remove the heat if your skin turns bright red. This is especially important if you are unable to feel pain, heat, or cold. You may have a greater risk of getting burned. Activity  Rest and return to your normal activities as told by your health care provider. Ask your  health care provider what activities are safe for you.  Avoid activities that take a lot of effort (are strenuous) for as long as told by your health care provider.  Do exercises as told by your health care provider. General instructions   Take over-the-counter and prescription medicines only as told by your health care provider.  If you have questions or concerns about safety while taking pain medicine, talk with your health care provider.  Do not drive or operate heavy machinery until you know how your pain medicine affects you.  Do not use any tobacco products, such as cigarettes, chewing tobacco, and e-cigarettes. Tobacco can delay bone healing. If  you need help quitting, ask your health care provider.  Keep all follow-up visits as told by your health care provider. This is important. How is this prevented?  Warm up and stretch before being active.  Cool down and stretch after being active.  Give your body time to rest between periods of activity.  Avoid: ? Being physically inactive for long periods at a time. ? Exercising or playing sports when you are tired or in pain.  Use correct form when playing sports and lifting heavy objects.  Use good posture when sitting and standing.  Maintain a healthy weight.  Sleep on a mattress with medium firmness to support your back.  Make sure to use equipment that fits you, including shoes that fit well.  Be safe and responsible while being active to avoid falls.  Do at least 150 minutes of moderate-intensity exercise each week, such as brisk walking or water aerobics. Try a form of exercise that takes stress off your back, such as swimming or stationary cycling.  Maintain physical fitness, including: ? Strength. ? Flexibility. ? Cardiovascular fitness. ? Endurance. Contact a health care provider if:  Your back pain does not improve after 6 weeks of treatment.  Your symptoms get worse. Get help right away if:  Your back pain is severe.  You are unable to stand or walk.  You develop pain in your legs.  You develop weakness in your buttocks or legs.  You have difficulty controlling when you urinate or when you have a bowel movement. This information is not intended to replace advice given to you by your health care provider. Make sure you discuss any questions you have with your health care provider. Document Released: 01/03/2005 Document Revised: 09/10/2015 Document Reviewed: 10/15/2014 Elsevier Interactive Patient Education  2017 [employer].     IF you received an x-ray today, you will receive an invoice from [facility]. Please contact [facility]  Radiology at [phone] with questions or concerns regarding your invoice.   IF you received labwork today, you will receive an invoice from [facility]. Please contact [facility] at [phone] with questions or concerns regarding your invoice.   Our billing staff will not be able to assist you with questions regarding bills from these companies.  You will be contacted with the lab results as soon as they are available. The fastest way to get your results is to activate your My Chart account. Instructions are located on the last page of this paperwork. If you have not heard from [place] regarding the results in 2 weeks, please contact this office.       I personally performed the services described in this documentation, which was scribed in my presence. The recorded information has been reviewed and considered for accuracy and completeness, addended by me as needed, and agree with information above.  Signed,  Merri Ray, MD Primary Care at [facility].  10/03/16 10:06 AM

## 2016-10-04 ENCOUNTER — Encounter: Payer: Self-pay | Admitting: Family Medicine

## 2016-10-04 ENCOUNTER — Other Ambulatory Visit: Payer: Self-pay | Admitting: Family Medicine

## 2016-10-04 DIAGNOSIS — F411 Generalized anxiety disorder: Secondary | ICD-10-CM

## 2016-10-04 NOTE — Telephone Encounter (Signed)
Is this okay to refill? 

## 2016-10-05 NOTE — Telephone Encounter (Signed)
ok 

## 2016-10-05 NOTE — Telephone Encounter (Signed)
Called in med to pharmacy  

## 2016-10-07 ENCOUNTER — Ambulatory Visit (INDEPENDENT_AMBULATORY_CARE_PROVIDER_SITE_OTHER): Payer: Medicare Other | Admitting: Family Medicine

## 2016-10-07 ENCOUNTER — Ambulatory Visit
Admission: RE | Admit: 2016-10-07 | Discharge: 2016-10-07 | Disposition: A | Discharge status: Home / Self Care | Payer: Medicare Other | Source: Ambulatory Visit | Attending: Nephrology | Admitting: Nephrology

## 2016-10-07 VITALS — BP 134/73 | HR 66 | Temp 98.9°F

## 2016-10-07 DIAGNOSIS — N183 Chronic kidney disease, stage 3 unspecified: Secondary | ICD-10-CM

## 2016-10-07 DIAGNOSIS — M5441 Lumbago with sciatica, right side: Secondary | ICD-10-CM | POA: Diagnosis not present

## 2016-10-07 DIAGNOSIS — N179 Acute kidney failure, unspecified: Secondary | ICD-10-CM | POA: Diagnosis not present

## 2016-10-07 DIAGNOSIS — M5431 Sciatica, right side: Secondary | ICD-10-CM | POA: Diagnosis not present

## 2016-10-07 DIAGNOSIS — Z6832 Body mass index (BMI) 32.0-32.9, adult: Secondary | ICD-10-CM

## 2016-10-07 MED ORDER — PREDNISONE 20 MG PO TABS: ORAL_TABLET | ORAL | 0 refills | Status: DC

## 2016-10-07 NOTE — Progress Notes (Signed)
Patient ID: Melissa Ferrell, female    DOB: Feb 19, 1935  Age: 81 y.o. MRN: [id number]  Chief Complaint  Patient presents with  . Follow-up    back pain per patient has worsen    Subjective:   Patient is here continuing to have pain in her low back. She is started having radicular pain down her right leg.  Current allergies, medications, problem list, past/family and social histories reviewed.  Objective:  BP 134/73 (BP Location: Right Arm, Patient Position: Sitting, Cuff Size: Large)   Pulse 66   Temp 98.9 F (37.2 C) (Oral)   Pleasant lady, overweight, sitting in her wheelchair. She is able to stand up. She is tender over the left SI joint. Mild tenderness over the lower lumbar spine. No CVA tenderness. Seated straight leg raising negative.  Assessment & Plan:   Assessment: 1. Sciatica of right side   2. Acute bilateral low back pain with right-sided sciatica       Plan: Sciatica, will treat with a round of prednisone. She was not truly allergic to prednisone in the past apparently. Her rheumatologist just told her not to go on prednisone many years ago. She has had some steroid intolerance of certain inhalers, thought to be problems from the propellants. We will give it to refer a low-dose short course. She cannot take NSAIDs due to her kidney function.  No orders of the defined types were placed in this encounter.   Meds ordered this encounter  Medications  . predniSONE (DELTASONE) 20 MG tablet    Sig: Take 2 daily for 3 days, and 1 daily for 3 dayts    Dispense:  9 tablet    Refill:  0         Patient Instructions   Take prednisone 2 pills daily for 3 days, then 1 pill daily for 3 days. Best taken after breakfast.  Continue taking the exercises as previously discussed. I realize that you are limited in your ability to do many of them, but do which can.  Try to avoid any lifting and straining, and be especially careful not to strain to back when you're getting  up or down.  Continue taking Tylenol if needed for pain. The maximum dose is 3000 mg in 24 hours which would be Tylenol extra strength 500 mg 2 pills 3 times daily.  Return if problems or continue to get worse.    IF you received an x-ray today, you will receive an invoice from [facility]. Please contact [facility] at [phone] with questions or concerns regarding your invoice.   IF you received labwork today, you will receive an invoice from [facility]. Please contact [facility] at [phone] with questions or concerns regarding your invoice.   Our billing staff will not be able to assist you with questions regarding bills from these companies.  You will be contacted with the lab results as soon as they are available. The fastest way to get your results is to activate your My Chart account. Instructions are located on the last page of this paperwork. If you have not heard from [place] regarding the results in 2 weeks, please contact this office.        Return if symptoms worsen or fail to improve.   Emilo Gras, MD 10/07/2016

## 2016-10-07 NOTE — Patient Instructions (Addendum)
Take prednisone 2 pills daily for 3 days, then 1 pill daily for 3 days. Best taken after breakfast.  Continue taking the exercises as previously discussed. I realize that you are limited in your ability to do many of them, but do which can.  Try to avoid any lifting and straining, and be especially careful not to strain to back when you're getting up or down.  Continue taking Tylenol if needed for pain. The maximum dose is 3000 mg in 24 hours which would be Tylenol extra strength 500 mg 2 pills 3 times daily.  Return if problems or continue to get worse.    IF you received an x-ray today, you will receive an invoice from [facility]. Please contact [facility] at [phone] with questions or concerns regarding your invoice.   IF you received labwork today, you will receive an invoice from [facility]. Please contact [facility] at [phone] with questions or concerns regarding your invoice.   Our billing staff will not be able to assist you with questions regarding bills from these companies.  You will be contacted with the lab results as soon as they are available. The fastest way to get your results is to activate your My Chart account. Instructions are located on the last page of this paperwork. If you have not heard from [place] regarding the results in 2 weeks, please contact this office.

## 2016-10-12 ENCOUNTER — Encounter: Payer: Self-pay | Admitting: Family Medicine

## 2016-10-16 ENCOUNTER — Encounter (HOSPITAL_COMMUNITY): Payer: Self-pay | Admitting: Emergency Medicine

## 2016-10-16 DIAGNOSIS — X509XXA Other and unspecified overexertion or strenuous movements or postures, initial encounter: Secondary | ICD-10-CM | POA: Diagnosis not present

## 2016-10-16 DIAGNOSIS — Z87891 Personal history of nicotine dependence: Secondary | ICD-10-CM | POA: Insufficient documentation

## 2016-10-16 DIAGNOSIS — S3210XA Unspecified fracture of sacrum, initial encounter for closed fracture: Secondary | ICD-10-CM | POA: Insufficient documentation

## 2016-10-16 DIAGNOSIS — Y999 Unspecified external cause status: Secondary | ICD-10-CM | POA: Diagnosis not present

## 2016-10-16 DIAGNOSIS — Y9389 Activity, other specified: Secondary | ICD-10-CM | POA: Diagnosis not present

## 2016-10-16 DIAGNOSIS — Y929 Unspecified place or not applicable: Secondary | ICD-10-CM | POA: Diagnosis not present

## 2016-10-16 DIAGNOSIS — Z79899 Other long term (current) drug therapy: Secondary | ICD-10-CM | POA: Insufficient documentation

## 2016-10-16 DIAGNOSIS — N183 Chronic kidney disease, stage 3 (moderate): Secondary | ICD-10-CM | POA: Insufficient documentation

## 2016-10-16 DIAGNOSIS — M549 Dorsalgia, unspecified: Secondary | ICD-10-CM | POA: Diagnosis not present

## 2016-10-16 DIAGNOSIS — J449 Chronic obstructive pulmonary disease, unspecified: Secondary | ICD-10-CM | POA: Insufficient documentation

## 2016-10-16 DIAGNOSIS — Z7982 Long term (current) use of aspirin: Secondary | ICD-10-CM | POA: Insufficient documentation

## 2016-10-16 DIAGNOSIS — I129 Hypertensive chronic kidney disease with stage 1 through stage 4 chronic kidney disease, or unspecified chronic kidney disease: Secondary | ICD-10-CM | POA: Insufficient documentation

## 2016-10-16 DIAGNOSIS — S299XXA Unspecified injury of thorax, initial encounter: Secondary | ICD-10-CM | POA: Diagnosis present

## 2016-10-16 DIAGNOSIS — I2510 Atherosclerotic heart disease of native coronary artery without angina pectoris: Secondary | ICD-10-CM | POA: Diagnosis not present

## 2016-10-16 DIAGNOSIS — M5489 Other dorsalgia: Secondary | ICD-10-CM | POA: Diagnosis not present

## 2016-10-16 NOTE — ED Triage Notes (Signed)
Per EMS, patient from independent living at [facility], patient c/o back pain and bilateral hip pain. Seen at [facility] x2 and dx with "pinched nerve" however has not had any relief in pain. Ambulatory. Reports pain worsens with movement. Denies bowel and bladder change, numbness and tingling.  BP 142/82 HR 68

## 2016-10-17 ENCOUNTER — Emergency Department (HOSPITAL_COMMUNITY): Payer: Medicare Other

## 2016-10-17 ENCOUNTER — Emergency Department (HOSPITAL_COMMUNITY)
Admission: EM | Admit: 2016-10-17 | Discharge: 2016-10-17 | Disposition: A | Discharge status: Home / Self Care | Payer: Medicare Other | Attending: Emergency Medicine | Admitting: Emergency Medicine

## 2016-10-17 DIAGNOSIS — S3992XA Unspecified injury of lower back, initial encounter: Secondary | ICD-10-CM | POA: Diagnosis not present

## 2016-10-17 DIAGNOSIS — S3210XA Unspecified fracture of sacrum, initial encounter for closed fracture: Secondary | ICD-10-CM | POA: Diagnosis not present

## 2016-10-17 DIAGNOSIS — G8911 Acute pain due to trauma: Secondary | ICD-10-CM | POA: Diagnosis not present

## 2016-10-17 DIAGNOSIS — M549 Dorsalgia, unspecified: Secondary | ICD-10-CM | POA: Diagnosis not present

## 2016-10-17 MED ORDER — HYDROCODONE-ACETAMINOPHEN 5-325 MG PO TABS
1.0000 | ORAL_TABLET | Freq: Once | ORAL | Status: AC
  Administered 2016-10-17: 0.5 via ORAL
  Filled 2016-10-17: qty 1

## 2016-10-17 MED ORDER — HYDROCODONE-ACETAMINOPHEN 5-325 MG PO TABS: 1.0000 | ORAL_TABLET | Freq: Four times a day (QID) | ORAL | 0 refills | Status: DC | PRN

## 2016-10-17 NOTE — ED Notes (Signed)
PTAR was called for pt's transportation back to [facility].

## 2016-10-17 NOTE — ED Notes (Signed)
PTAR here to transport pt back to [facility].

## 2016-10-17 NOTE — ED Provider Notes (Signed)
[facility] DEPT Provider Note   CSN: [id number] Arrival date & time: 10/16/16  1707     History   Chief Complaint Chief Complaint  Patient presents with  . Back Pain  . Hip Pain    HPI Melissa Ferrell is a 81 y.o. female.  HPI Patient with 2 weeks of acute onset low back pain after trying to move a plan. She does have some radiation of the pain into the buttocks on the right. No radiation of pain down the legs. No urinary incontinence or retention. No focal weakness or numbness. Patient has not had prior imaging. Past Medical History:  Diagnosis Date  . Anxiety and depression   . Carotid artery disease (HCC)    hx CAD, dCHF listed in [place] - sees cardiologist, vasc and neurologist  . CKD (chronic kidney disease) stage 3, GFR 30-59 ml/min 08/24/2016  . COPD (chronic obstructive pulmonary disease) [facility])    sees pulmonology  . DDD (degenerative disc disease)   . Disseminated superficial actinic porokeratosis    bilateral calves  . Fracture, spinal   . GERD (gastroesophageal reflux disease)    hx PUD 2ndary to NSAIDs 1975  . Hemorrhoids   . HTN (hypertension)   . Hx of adenomatous colonic polyps   . Hyperlipidemia    intol to statins  . IBS (irritable bowel syndrome)   . Memory loss    seen by neurology in 2015 and referred to neuropsych for anxiety  . Migraine headache   . Osteopenia determined by x-ray   . RHEUMATIC FEVER, HX OF 10/15/2006   Qualifier: Diagnosis of  By: Jenny Reichmann MD, Hunt Oris   . Rheumatoid arthritis(714.0)    per her report, in remission for many years per her report  . Varicose veins    hx LE edema    Patient Active Problem List   Diagnosis Date Noted  . CKD (chronic kidney disease) stage 3, GFR 30-59 ml/min (HCC) 08/24/2016  . Osteopenia determined by x-ray   . Generalized anxiety disorder 07/27/2015  . Depression and anxiety 12/02/2014  . Shortness of breath 07/01/2014  . PVD (peripheral vascular disease) ([place]) 05/02/2014  . Lower extremity  edema 04/30/2014  . Varicose veins of lower extremities with other complications 16/01/930  . Cerebrovascular disease 08/20/2010  . Hyperlipemia 10/15/2006  . MIGRAINE HEADACHE 10/15/2006  . Essential hypertension 10/15/2006  . COPD (chronic obstructive pulmonary disease) with emphysema ([place]) 10/15/2006    Past Surgical History:  Procedure Laterality Date  . chronically infected lymph node removed  1973  . THYROGLOSSAL DUCT CYST     1943, 1944, 1973    OB History    No data available       Home Medications    Prior to Admission medications   Medication Sig Start Date End Date Taking? Authorizing Provider  acetaminophen (TYLENOL) 500 MG tablet Take 1,000 mg by mouth every 6 (six) hours as needed for moderate pain.   Yes [provider]  albuterol (PROVENTIL HFA;VENTOLIN HFA) 108 (90 Base) MCG/ACT inhaler Inhale 2 puffs into the lungs every 6 (six) hours as needed for wheezing or shortness of breath. 09/26/16  Yes Juanito Doom, MD  ALPRAZolam (XANAX) 0.25 MG tablet TAKE ONE-HALF TABLET BY MOUTH AS NEEDED FOR SEVERE ANXIETY OR PANIC. NOT MORE THN EVERY 12 HOURS 10/05/16  Yes Henson, Vickie L, NP-C  aspirin 81 MG tablet Take 81 mg by mouth 2 (two) times daily.    Yes [provider]  Carboxymethylcellul-Glycerin (OPTIVE)  0.5-0.9 % SOLN Apply 1 drop to eye 3 (three) times daily as needed. --dry eyes   Yes [provider]  carvedilol (COREG) 12.5 MG tablet TAKE 1 AND 1/2 TABLETS(18.75 MG) BY MOUTH TWICE DAILY WITH A MEAL 09/27/16  Yes Crenshaw, Denice Bors, MD  Flaxseed, Linseed, (FLAXSEED OIL) 1000 MG CAPS Take 1 capsule by mouth daily at 12 noon.    Yes [provider]  folic acid (FOLVITE) 099 MCG tablet Take 400 mcg by mouth daily.   Yes [provider]  hydrochlorothiazide (HYDRODIURIL) 50 MG tablet Take 25 mg by mouth daily.   Yes [provider]  KRILL OIL PO Take 500 mg by mouth daily.    Yes [provider]    Lecithin 1200 MG CAPS Take 1,200 mg by mouth every morning.    Yes [provider]  losartan (COZAAR) 50 MG tablet TAKE 1 TABLET(50 MG) BY MOUTH TWICE DAILY 07/25/16  Yes Crenshaw, Denice Bors, MD  Magnesium 250 MG TABS Take 250 mg by mouth daily.    Yes [provider]  Multiple Vitamins-Minerals (CENTRUM SILVER PO) Take 1 tablet by mouth every morning.    Yes [provider]  multivitamin-lutein (OCUVITE-LUTEIN) CAPS capsule Take 1 capsule by mouth daily.   Yes [provider]  tiotropium (SPIRIVA) 18 MCG inhalation capsule Place 1 capsule (18 mcg total) into inhaler and inhale daily. 09/26/16  Yes Juanito Doom, MD  vitamin B-12 (CYANOCOBALAMIN) 500 MCG tablet Take 500 mcg by mouth every morning.    Yes [provider]  doxycycline (VIBRA-TABS) 100 MG tablet Take 1 tablet (100 mg total) by mouth 2 (two) times daily. Patient not taking: Reported on 10/07/2016 09/26/16   Juanito Doom, MD  HYDROcodone-acetaminophen (NORCO) 5-325 MG tablet Take 1 tablet by mouth every 6 (six) hours as needed for severe pain. 10/17/16   Julianne Rice, MD  predniSONE (DELTASONE) 20 MG tablet Take 2 daily for 3 days, and 1 daily for 3 dayts Patient not taking: Reported on 10/17/2016 10/07/16   Posey Boyer, MD    Family History Family History  Problem Relation Age of Onset  . Heart disease Mother        MI  . Hypertension Mother   . Heart attack Mother   . Brain cancer Father   . Cancer Father   . Breast cancer Maternal Grandmother   . Stomach cancer Brother   . Cancer Brother   . Heart disease Brother   . Hypertension Brother   . Heart attack Brother   . AAA (abdominal aortic aneurysm) Brother   . Aneurysm Brother        brain  . Melanoma Brother   . Colon cancer Brother   . Heart disease Brother   . Alcohol abuse Unknown   . Heart attack Brother 3  . Allergies Sister   . Hypertension Sister     Social History Social History  Substance Use  Topics  . Smoking status: Former Smoker    Packs/day: 3.00    Years: 30.00    Types: Cigarettes    Quit date: 01/17/1985  . Smokeless tobacco: Never Used     Comment: previous 80 pack year history  . Alcohol use No     Allergies   Atrovent [ipratropium]; Bee venom; Qvar [beclomethasone]; Stiolto respimat [tiotropium bromide-olodaterol]; Anoro ellipta [umeclidinium-vilanterol]; Prednisone; Benzocaine; Breo ellipta [fluticasone furoate-vilanterol]; Bystolic [nebivolol hcl]; Cefdinir; Ciprofloxacin; Diphenhydramine hcl; Erythromycin; Indomethacin; Lasix [furosemide]; Other; Statins; Tobramycin; Zetia [  ezetimibe]; Azithromycin; Paba derivatives; and Penicillins   Review of Systems Review of Systems  Constitutional: Negative for chills and fever.  Respiratory: Negative for shortness of breath.   Cardiovascular: Negative for chest pain.  Gastrointestinal: Negative for abdominal pain, nausea and vomiting.  Genitourinary: Negative for difficulty urinating and urgency.  Musculoskeletal: Positive for back pain and myalgias. Negative for neck pain and neck stiffness.  Skin: Negative for rash and wound.  Neurological: Negative for dizziness, syncope, weakness, light-headedness and numbness.  All other systems reviewed and are negative.    Physical Exam Updated Vital Signs BP (!) 145/59   Pulse 61   Temp 98.6 F (37 C) (Oral)   Resp 18   Ht 5\' 3"  (1.6 m)   Wt 87.1 kg (192 lb)   SpO2 96%   BMI 34.01 kg/m   Physical Exam  Constitutional: She appears well-developed and well-nourished. No distress.  HENT:  Head: Normocephalic and atraumatic.  Eyes: Conjunctivae are normal.  Neck: Neck supple.  Cardiovascular: Normal rate and regular rhythm.   No murmur heard. Pulmonary/Chest: Effort normal and breath sounds normal. No respiratory distress.  Abdominal: Soft. She exhibits no distension and no mass. There is no tenderness. There is no rebound and no guarding. No hernia.    Musculoskeletal: She exhibits tenderness. She exhibits no edema.  Patient with focal inferior lumbar midline tenderness. Negative straight leg raise bilaterally. No lower extremity asymmetry, tenderness or swelling. Distal pulses intact.  Neurological: She is alert.  5/5 motor in bilateral lower extremity. Sensation including saddle region is intact.  Skin: Skin is warm and dry. Capillary refill takes less than 2 seconds. She is not diaphoretic.  Psychiatric: She has a normal mood and affect.  Nursing note and vitals reviewed.    ED Treatments / Results  Labs (all labs ordered are listed, but only abnormal results are displayed) Labs Reviewed - No data to display  EKG  EKG Interpretation None       Radiology Ct Lumbar Spine Wo Contrast  Result Date: 10/17/2016 CLINICAL DATA:  81 y/o  F; back and bilateral hip pain. EXAM: CT LUMBAR SPINE WITHOUT CONTRAST TECHNIQUE: Multidetector CT imaging of the lumbar spine was performed without intravenous contrast administration. Multiplanar CT image reconstructions were also generated. COMPARISON:  None. FINDINGS: Segmentation: 5 lumbar type vertebrae. Alignment: Mild reverse S curvature of the lumbar spine. Normal lumbar lordosis without listhesis. Vertebrae: No loss of vertebral body height. Minimally displaced fracture in the right sacrum at the paramedian S1 level at the site of bony lucency and irregular cortical destruction suspicious for a pathologic lesion (Series 3, image 119). Mildly displaced fracture of the left sacral ala near the sacroiliac joint (series 4, image 120). Paraspinal and other soft tissues: Calcific atherosclerosis of the abdominal aorta. Disc levels: Moderate loss of height of L1-2 and L2-3 intervertebral discs levels. Severe loss of height of L5-S1 intervertebral disc level. Mild facet arthropathy. IMPRESSION: 1. Mildly displaced acute left sacral ala fracture. 2. Fracture of the right paramedian sacrum at the S1 levels at  site of bony lucency and irregular cortical destruction suspicious for pathologic lesion. 3. No acute fracture of the lumbar spine. 4. Mild reverse S curvature of lumbar spine and degenerative changes greatest at L5-S1 level. No high-grade bony canal stenosis. Electronically Signed   By: Kristine Garbe M.D.   On: 10/17/2016 02:58    Procedures Procedures (including critical care time)  Medications Ordered in ED Medications  HYDROcodone-acetaminophen (NORCO/VICODIN) 5-325 MG per tablet  1 tablet (0.5 tablets Oral Given 10/17/16 0428)     Initial Impression / Assessment and Plan / ED Course  I have reviewed the triage vital signs and the nursing notes.  Pertinent labs & imaging results that were available during my care of the patient were reviewed by me and considered in my medical decision making (see chart for details).     Discussed with Dr. Apolonio Schneiders. Suggested pain control, weightbearing as tolerated and following up for further workup in his office in 2 weeks.  Final Clinical Impressions(s) / ED Diagnoses   Final diagnoses:  Closed fracture of sacrum, unspecified fracture morphology, initial encounter (HCC)    New Prescriptions New Prescriptions   HYDROCODONE-ACETAMINOPHEN (NORCO) 5-325 MG TABLET    Take 1 tablet by mouth every 6 (six) hours as needed for severe pain.     Julianne Rice, MD 10/17/16 [phone]

## 2016-10-20 ENCOUNTER — Encounter: Payer: Self-pay | Admitting: Family Medicine

## 2016-10-25 ENCOUNTER — Other Ambulatory Visit: Payer: Self-pay | Admitting: Cardiology

## 2016-10-25 ENCOUNTER — Other Ambulatory Visit: Payer: Self-pay | Admitting: Family Medicine

## 2016-10-25 DIAGNOSIS — F411 Generalized anxiety disorder: Secondary | ICD-10-CM

## 2016-10-25 NOTE — Telephone Encounter (Signed)
Is this okay to refill? 

## 2016-10-25 NOTE — Telephone Encounter (Signed)
ok 

## 2016-10-26 NOTE — Telephone Encounter (Signed)
Called in med to pharmacy  

## 2016-11-02 DIAGNOSIS — S3210XA Unspecified fracture of sacrum, initial encounter for closed fracture: Secondary | ICD-10-CM | POA: Diagnosis not present

## 2016-11-02 DIAGNOSIS — M545 Low back pain: Secondary | ICD-10-CM | POA: Diagnosis not present

## 2016-11-07 DIAGNOSIS — N183 Chronic kidney disease, stage 3 (moderate): Secondary | ICD-10-CM | POA: Diagnosis not present

## 2016-11-09 NOTE — Progress Notes (Signed)
HPI: FU hypertension. Myoview in May of 2011 was normal. The study was not gated. Echocardiogram in August 2013 showed normal LV function. We felt that her dyspnea was most likely secondary to COPD. Carotid Dopplers in Sept 2015 showed no hemodynamically significant stenosis. Abdominal ultrasound April 2016 showed no aneurysm. Echocardiogram May 2018 showed normal LV function, mild mitral regurgitation and mild left atrial enlargement. BNP 171. Patient recently diagnosed with closed fracture of sacrum. Since I last saw her, she denies dyspnea. No exertional chest pain. No syncope. Occasional minimal pedal edema.  Current Outpatient Prescriptions  Medication Sig Dispense Refill  . acetaminophen (TYLENOL) 500 MG tablet Take 1,000 mg by mouth every 6 (six) hours as needed for moderate pain.    Marland Kitchen albuterol (PROVENTIL HFA;VENTOLIN HFA) 108 (90 Base) MCG/ACT inhaler Inhale 2 puffs into the lungs every 6 (six) hours as needed for wheezing or shortness of breath. 1 Inhaler 6  . ALPRAZolam (XANAX) 0.25 MG tablet TAKE 1/2 TABLET BY MOUTH AS NEEDED FOR SEVERE ANXIETY 20 tablet 0  . aspirin 81 MG tablet Take 81 mg by mouth 2 (two) times daily.     . Carboxymethylcellul-Glycerin (OPTIVE) 0.5-0.9 % SOLN Apply 1 drop to eye 3 (three) times daily as needed. --dry eyes    . carvedilol (COREG) 12.5 MG tablet TAKE 1 AND 1/2 TABLETS(18.75 MG) BY MOUTH TWICE DAILY WITH A MEAL 270 tablet 1  . doxycycline (VIBRA-TABS) 100 MG tablet Take 1 tablet (100 mg total) by mouth 2 (two) times daily. 10 tablet 1  . Flaxseed, Linseed, (FLAXSEED OIL) 1000 MG CAPS Take 1 capsule by mouth daily at 12 noon.     . folic acid (FOLVITE) 147 MCG tablet Take 400 mcg by mouth daily.    Marland Kitchen KRILL OIL PO Take 500 mg by mouth daily.     . Lecithin 1200 MG CAPS Take 1,200 mg by mouth every morning.     Marland Kitchen losartan (COZAAR) 50 MG tablet TAKE 1 TABLET(50 MG) BY MOUTH TWICE DAILY 180 tablet 0  . Magnesium 250 MG TABS Take 250 mg by mouth daily.      . Multiple Vitamins-Minerals (CENTRUM SILVER PO) Take 1 tablet by mouth every morning.     . multivitamin-lutein (OCUVITE-LUTEIN) CAPS capsule Take 1 capsule by mouth daily.    Marland Kitchen tiotropium (SPIRIVA) 18 MCG inhalation capsule Place 1 capsule (18 mcg total) into inhaler and inhale daily. 30 capsule 11  . vitamin B-12 (CYANOCOBALAMIN) 500 MCG tablet Take 500 mcg by mouth every morning.      No current facility-administered medications for this visit.      Past Medical History:  Diagnosis Date  . Anxiety and depression   . Carotid artery disease (HCC)    hx CAD, dCHF listed in [place] - sees cardiologist, vasc and neurologist  . CKD (chronic kidney disease) stage 3, GFR 30-59 ml/min ([place]) 08/24/2016  . COPD (chronic obstructive pulmonary disease) [facility])    sees pulmonology  . DDD (degenerative disc disease)   . Disseminated superficial actinic porokeratosis    bilateral calves  . Fracture, spinal   . GERD (gastroesophageal reflux disease)    hx PUD 2ndary to NSAIDs 1975  . Hemorrhoids   . HTN (hypertension)   . Hx of adenomatous colonic polyps   . Hyperlipidemia    intol to statins  . IBS (irritable bowel syndrome)   . Memory loss    seen by neurology in 2015 and referred to neuropsych for  anxiety  . Migraine headache   . Osteopenia determined by x-ray   . RHEUMATIC FEVER, HX OF 10/15/2006   Qualifier: Diagnosis of  By: Jenny Reichmann MD, Hunt Oris   . Rheumatoid arthritis(714.0)    per her report, in remission for many years per her report  . Varicose veins    hx LE edema    Past Surgical History:  Procedure Laterality Date  . chronically infected lymph node removed  1973  . THYROGLOSSAL DUCT CYST     1943, 1944, 50    Social History   Social History  . Marital status: Divorced    Spouse name: N/A  . Number of children: 3  . Years of education: N/A   Occupational History  . Retired-homemaker    Social History Main Topics  . Smoking status: Former Smoker    Packs/day:  3.00    Years: 30.00    Types: Cigarettes    Quit date: 01/17/1985  . Smokeless tobacco: Never Used     Comment: previous 80 pack year history  . Alcohol use No  . Drug use: No  . Sexual activity: No   Other Topics Concern  . Not on file   Social History Narrative   Work or School: volunteers at [employer] Situation: lives at [place]:       Lifestyle: no regular exercise; diet is ok          Family History  Problem Relation Age of Onset  . Heart disease Mother        MI  . Hypertension Mother   . Heart attack Mother   . Brain cancer Father   . Cancer Father   . Breast cancer Maternal Grandmother   . Stomach cancer Brother   . Cancer Brother   . Heart disease Brother   . Hypertension Brother   . Heart attack Brother   . AAA (abdominal aortic aneurysm) Brother   . Aneurysm Brother        brain  . Melanoma Brother   . Colon cancer Brother   . Heart disease Brother   . Alcohol abuse Unknown   . Heart attack Brother 72  . Allergies Sister   . Hypertension Sister     ROS: Residual pain from prior sacral issue but no fevers or chills, productive cough, hemoptysis, dysphasia, odynophagia, melena, hematochezia, dysuria, hematuria, rash, seizure activity, orthopnea, PND, pedal edema, claudication. Remaining systems are negative.  Physical Exam: Well-developed well-nourished in no acute distress.  Skin is warm and dry.  HEENT is normal.  Neck is supple.  Chest is clear to auscultation with normal expansion.  Cardiovascular exam is regular rate and rhythm.  Abdominal exam nontender or distended. No masses palpated. Extremities show no edema. neuro grossly intact   A/P  1 Hypertension-blood pressure is mildly elevated. Add HCTZ 12.5 mg daily. Follow blood pressure and adjust regimen as needed. Check potassium and renal function in one week.  2 hyperlipidemia-continue diet. She is intolerant to statins.  3 carotid artery  disease-mild on most recent carotid Dopplers. Continue aspirin. Intolerant to statins.  4 anxiety-management per primary care.  5 dyspnea-patient appears to be euvolemic on examination. Symptoms improved. Last echocardiogram showed normal LV systolic function and BNP only minimally elevated. No further evaluation at this point.  Kirk Ruths, MD

## 2016-11-17 ENCOUNTER — Encounter: Payer: Self-pay | Admitting: Cardiology

## 2016-11-17 ENCOUNTER — Ambulatory Visit (INDEPENDENT_AMBULATORY_CARE_PROVIDER_SITE_OTHER): Payer: Medicare Other | Admitting: Cardiology

## 2016-11-17 VITALS — BP 142/80 | HR 68 | Ht 64.0 in | Wt 179.0 lb

## 2016-11-17 DIAGNOSIS — R0600 Dyspnea, unspecified: Secondary | ICD-10-CM

## 2016-11-17 DIAGNOSIS — I10 Essential (primary) hypertension: Secondary | ICD-10-CM | POA: Diagnosis not present

## 2016-11-17 DIAGNOSIS — E7800 Pure hypercholesterolemia, unspecified: Secondary | ICD-10-CM

## 2016-11-17 MED ORDER — HYDROCHLOROTHIAZIDE 12.5 MG PO CAPS: 12.5000 mg | ORAL_CAPSULE | Freq: Every day | ORAL | 3 refills | Status: DC

## 2016-11-17 NOTE — Patient Instructions (Signed)
Medication Instructions:   START HCTZ 12.5 MG ONCE DAILY= 1/2 OF THE 25 MG TABLET ONCE DAILY  Labwork:  Your physician recommends that you return for lab work in: [place]:  Your physician wants you to follow-up in: [place] will receive a reminder letter in the mail two months in advance. If you don't receive a letter, please call our office to schedule the follow-up appointment.   If you need a refill on your cardiac medications before your next appointment, please call your pharmacy.

## 2016-11-18 ENCOUNTER — Encounter: Payer: Self-pay | Admitting: Family Medicine

## 2016-11-18 ENCOUNTER — Encounter: Payer: Self-pay | Admitting: Cardiology

## 2016-11-18 DIAGNOSIS — N183 Chronic kidney disease, stage 3 (moderate): Secondary | ICD-10-CM | POA: Diagnosis not present

## 2016-11-18 DIAGNOSIS — Z6832 Body mass index (BMI) 32.0-32.9, adult: Secondary | ICD-10-CM | POA: Diagnosis not present

## 2016-11-22 ENCOUNTER — Other Ambulatory Visit: Payer: Self-pay | Admitting: Family Medicine

## 2016-11-22 DIAGNOSIS — F411 Generalized anxiety disorder: Secondary | ICD-10-CM

## 2016-11-22 NOTE — Telephone Encounter (Signed)
Called in med to pharmacy  

## 2016-11-22 NOTE — Telephone Encounter (Signed)
Is this okay to refill? 

## 2016-11-22 NOTE — Telephone Encounter (Signed)
Ok

## 2016-11-28 DIAGNOSIS — S3210XD Unspecified fracture of sacrum, subsequent encounter for fracture with routine healing: Secondary | ICD-10-CM | POA: Diagnosis not present

## 2016-11-28 DIAGNOSIS — M7918 Myalgia, other site: Secondary | ICD-10-CM | POA: Diagnosis not present

## 2016-11-30 DIAGNOSIS — M545 Low back pain: Secondary | ICD-10-CM | POA: Diagnosis not present

## 2016-11-30 DIAGNOSIS — M7918 Myalgia, other site: Secondary | ICD-10-CM | POA: Diagnosis not present

## 2016-11-30 DIAGNOSIS — S3210XD Unspecified fracture of sacrum, subsequent encounter for fracture with routine healing: Secondary | ICD-10-CM | POA: Diagnosis not present

## 2016-12-02 ENCOUNTER — Encounter: Payer: Self-pay | Admitting: Family Medicine

## 2016-12-15 DIAGNOSIS — Z79899 Other long term (current) drug therapy: Secondary | ICD-10-CM | POA: Diagnosis not present

## 2016-12-15 DIAGNOSIS — Z5181 Encounter for therapeutic drug level monitoring: Secondary | ICD-10-CM | POA: Diagnosis not present

## 2016-12-15 DIAGNOSIS — Z87891 Personal history of nicotine dependence: Secondary | ICD-10-CM | POA: Diagnosis not present

## 2016-12-15 DIAGNOSIS — D480 Neoplasm of uncertain behavior of bone and articular cartilage: Secondary | ICD-10-CM | POA: Diagnosis not present

## 2016-12-19 ENCOUNTER — Encounter: Payer: Self-pay | Admitting: Cardiology

## 2016-12-19 MED ORDER — CARVEDILOL 12.5 MG PO TABS: 25.0000 mg | ORAL_TABLET | Freq: Two times a day (BID) | ORAL | 1 refills | Status: AC

## 2016-12-22 ENCOUNTER — Encounter: Payer: Self-pay | Admitting: Family Medicine

## 2016-12-22 ENCOUNTER — Ambulatory Visit (INDEPENDENT_AMBULATORY_CARE_PROVIDER_SITE_OTHER): Payer: Medicare Other | Admitting: Family Medicine

## 2016-12-22 ENCOUNTER — Other Ambulatory Visit: Payer: Self-pay | Admitting: Family Medicine

## 2016-12-22 VITALS — BP 142/84 | HR 67 | Wt 178.0 lb

## 2016-12-22 DIAGNOSIS — F411 Generalized anxiety disorder: Secondary | ICD-10-CM

## 2016-12-22 DIAGNOSIS — I10 Essential (primary) hypertension: Secondary | ICD-10-CM

## 2016-12-22 MED ORDER — ALPRAZOLAM 0.25 MG PO TABS: 0.2500 mg | ORAL_TABLET | Freq: Two times a day (BID) | ORAL | 0 refills | Status: DC | PRN

## 2016-12-22 NOTE — Progress Notes (Signed)
   Subjective:    Patient ID: Melissa Ferrell, female    DOB: 1935-05-17, 81 y.o.   MRN: [id number]  HPI Chief Complaint  Patient presents with  . Follow-up    Pt stated have cancer everywhere....by [facility] and having a lot test done.   She is here to follow up on chronic health conditions.  She would like to discuss generalized anxiety and requests refill on Xanax.  She was recently diagnosed with bone cancer and is undergoing additional testing tomorrow at [facility] to determine the extent of her cancer.  States she has 2 sons who have been very supportive.  States she does not plan on doing any sort of treatment for cancer and her goal is to have comfort care and make the most of the time she has left.  She has expressed this to her son's. She currently has in-home health care. Hypertension is being managed by her nephrologist. She is being followed closely by her orthopedist as well.  She has no other concerns today. Denies fever, chills, dizziness, chest pain, palpitations, shortness of breath.   Reviewed allergies, medications, past medical, surgical, and social history.    Review of Systems Pertinent positives and negatives in the history of present illness.     Objective:   Physical Exam BP (!) 142/84   Pulse 67   Wt 178 lb (80.7 kg)   SpO2 98%   BMI 30.55 kg/m   Alert and oriented and in no acute distress.  She is in a wheelchair.  Not otherwise examined.      Assessment & Plan:  Generalized anxiety disorder - Plan: ALPRAZolam (XANAX) 0.25 MG tablet  Essential hypertension  She is aware that her blood pressure is elevated today and states that today's reading is much improved compared to recent readings. She is undergoing extensive testing for recent diagnosis of bone cancer.  She is actually in good spirits. I will refill alprazolam, she is not abusing this.  Understandably she is feeling quite anxious lately regarding her health. She will follow-up as needed.   Discussed that I will help her if she decides to pursue hospice care and she will let me know if she needs my assistance in this matter.

## 2016-12-23 DIAGNOSIS — C787 Secondary malignant neoplasm of liver and intrahepatic bile duct: Secondary | ICD-10-CM | POA: Diagnosis not present

## 2016-12-23 DIAGNOSIS — M899 Disorder of bone, unspecified: Secondary | ICD-10-CM | POA: Diagnosis not present

## 2016-12-23 DIAGNOSIS — K769 Liver disease, unspecified: Secondary | ICD-10-CM | POA: Diagnosis not present

## 2016-12-23 DIAGNOSIS — R918 Other nonspecific abnormal finding of lung field: Secondary | ICD-10-CM | POA: Diagnosis not present

## 2016-12-23 DIAGNOSIS — D480 Neoplasm of uncertain behavior of bone and articular cartilage: Secondary | ICD-10-CM | POA: Diagnosis not present

## 2016-12-28 ENCOUNTER — Telehealth: Payer: Self-pay | Admitting: Family Medicine

## 2016-12-28 NOTE — Telephone Encounter (Signed)
Thanks. You can let her know that I did get the report. Let me know if there is anything I can do for her. Sounds like they are taking care of things.

## 2016-12-28 NOTE — Telephone Encounter (Signed)
Pt called & states got report from [facility] is everywhere and they are supposed to send report to you today and doctor has made recommendation for palliative care and radiation and she wanted you to know.

## 2016-12-29 ENCOUNTER — Telehealth: Payer: Self-pay | Admitting: Pulmonary Disease

## 2016-12-29 ENCOUNTER — Encounter: Payer: Self-pay | Admitting: Family Medicine

## 2016-12-29 ENCOUNTER — Other Ambulatory Visit: Payer: Self-pay

## 2016-12-29 MED ORDER — GABAPENTIN 300 MG PO CAPS: 300.0000 mg | ORAL_CAPSULE | Freq: Every day | ORAL | 0 refills | Status: DC

## 2016-12-29 MED ORDER — GABAPENTIN 100 MG PO CAPS: 100.0000 mg | ORAL_CAPSULE | Freq: Two times a day (BID) | ORAL | 0 refills | Status: DC

## 2016-12-29 NOTE — Telephone Encounter (Signed)
BQ did you need to see pt before 03/2017 since she has been diagnosed with cancer in everywhere including the lungs at [facility]. She wants to make sure BQ will prescribe her Spiriva and rescue inhaler, I reassured her that BQ would still refill her meds. Please advise.

## 2016-12-30 ENCOUNTER — Telehealth: Payer: Self-pay | Admitting: Family Medicine

## 2016-12-30 NOTE — Telephone Encounter (Signed)
Pt called stating that she has been released from [facility] and she was advised that she need to see an oncologist that also specialize in pain mgmnt. Pt would like help locating a doctor that specializes in both and she thinks that she will need a referral for an appointment. Need this asap because her pain level is at 10 or 10+ most days.

## 2016-12-30 NOTE — Telephone Encounter (Signed)
I called [facility], left a message for the referral specialist to discuss what we needed to do and how to proceed. Left call back number.

## 2016-12-30 NOTE — Telephone Encounter (Signed)
Please call [facility] and let them know she was diagnosed with primary lung cancer but has metastases, she now has multiple bone lesions.  This was diagnosed by her orthopedist and then further evaluated at [facility].  Patient states she has been released from [facility] and needs a Museum/gallery conservator.  She is requesting an oncologist who also deals with pain management.  Please get their advice.  If it would be best for her to get the referral from her orthopedist and we can do this.

## 2016-12-30 NOTE — Telephone Encounter (Signed)
Pt called and a wanted to let you  Know that palliative care is coming to her house  Friday December 21st

## 2016-12-31 NOTE — Telephone Encounter (Signed)
Vickie has emailed pt

## 2017-01-02 ENCOUNTER — Telehealth: Payer: Self-pay

## 2017-01-02 NOTE — Telephone Encounter (Signed)
Spoke with Melissa Ferrell- she was made aware Dr.Lalonde will be attending for pt. Melissa Ferrell

## 2017-01-02 NOTE — Telephone Encounter (Signed)
[name] with [facility] called requesting that you be attending of this Melissa Ferrell pt as [facility] reports she needs Hospice care, but he did not want to be attending. Pt son is requesting evaluation soon please. [name] CB # [phone]. Victorino December

## 2017-01-02 NOTE — Telephone Encounter (Signed)
ok 

## 2017-01-03 NOTE — Telephone Encounter (Signed)
[facility] called me back, they stated that either we could do the referral, or the orthopedist, but it needs to be whoever has the most information. They would need all of her labs, reports, and pathology.   Also their doctors do not do pain management. She stated that if we wanted her to have Oncology care they could do that, but they do not manage the pain part. They have tried to send people to pain clinics before and they have had no luck getting their patients in.   Would just like to know how to proceed.

## 2017-01-03 NOTE — Telephone Encounter (Signed)
Please let me know what you would like to do with her care. Thanks.

## 2017-01-03 NOTE — Telephone Encounter (Signed)
Spoke with pt, aware of recs.  States she does not currently have any breathing complaints, but will call if she needs to be seen sooner.  Nothing further needed.

## 2017-01-03 NOTE — Telephone Encounter (Signed)
I've reviewed the [facility] notes.  I'm sorry to hear about this development.  Keeping the March appointment is fine.  However I'm happy to see her sooner if she feels necessary.

## 2017-01-05 DIAGNOSIS — Z6832 Body mass index (BMI) 32.0-32.9, adult: Secondary | ICD-10-CM | POA: Diagnosis not present

## 2017-01-05 DIAGNOSIS — N183 Chronic kidney disease, stage 3 (moderate): Secondary | ICD-10-CM | POA: Diagnosis not present

## 2017-01-06 ENCOUNTER — Encounter: Payer: Self-pay | Admitting: Family Medicine

## 2017-01-06 DIAGNOSIS — J449 Chronic obstructive pulmonary disease, unspecified: Secondary | ICD-10-CM | POA: Diagnosis not present

## 2017-01-06 DIAGNOSIS — N183 Chronic kidney disease, stage 3 (moderate): Secondary | ICD-10-CM | POA: Diagnosis not present

## 2017-01-06 DIAGNOSIS — I25810 Atherosclerosis of coronary artery bypass graft(s) without angina pectoris: Secondary | ICD-10-CM | POA: Diagnosis not present

## 2017-01-06 DIAGNOSIS — C787 Secondary malignant neoplasm of liver and intrahepatic bile duct: Secondary | ICD-10-CM | POA: Diagnosis not present

## 2017-01-06 DIAGNOSIS — C7940 Secondary malignant neoplasm of unspecified part of nervous system: Secondary | ICD-10-CM | POA: Diagnosis not present

## 2017-01-06 DIAGNOSIS — I10 Essential (primary) hypertension: Secondary | ICD-10-CM | POA: Diagnosis not present

## 2017-01-06 DIAGNOSIS — K219 Gastro-esophageal reflux disease without esophagitis: Secondary | ICD-10-CM | POA: Diagnosis not present

## 2017-01-06 DIAGNOSIS — C3490 Malignant neoplasm of unspecified part of unspecified bronchus or lung: Secondary | ICD-10-CM | POA: Diagnosis not present

## 2017-01-06 DIAGNOSIS — C7951 Secondary malignant neoplasm of bone: Secondary | ICD-10-CM | POA: Diagnosis not present

## 2017-01-06 DIAGNOSIS — M069 Rheumatoid arthritis, unspecified: Secondary | ICD-10-CM | POA: Diagnosis not present

## 2017-01-06 DIAGNOSIS — I509 Heart failure, unspecified: Secondary | ICD-10-CM | POA: Diagnosis not present

## 2017-01-12 ENCOUNTER — Encounter (HOSPITAL_COMMUNITY)
Admission: EM | Disposition: A | Discharge status: Hospice | Payer: Self-pay | Source: Home / Self Care | Attending: Family Medicine

## 2017-01-12 ENCOUNTER — Inpatient Hospital Stay (HOSPITAL_COMMUNITY)

## 2017-01-12 ENCOUNTER — Encounter (HOSPITAL_COMMUNITY): Payer: Self-pay

## 2017-01-12 ENCOUNTER — Emergency Department (HOSPITAL_COMMUNITY)

## 2017-01-12 ENCOUNTER — Inpatient Hospital Stay: Admit: 2017-01-12 | Payer: Medicare Other | Admitting: Orthopedic Surgery

## 2017-01-12 ENCOUNTER — Other Ambulatory Visit: Payer: Self-pay

## 2017-01-12 ENCOUNTER — Inpatient Hospital Stay (HOSPITAL_COMMUNITY): Admitting: Anesthesiology

## 2017-01-12 ENCOUNTER — Inpatient Hospital Stay (HOSPITAL_COMMUNITY)
Admission: EM | Admit: 2017-01-12 | Discharge: 2017-01-21 | DRG: 480 | Disposition: A | Discharge status: Hospice | Attending: Family Medicine | Admitting: Family Medicine

## 2017-01-12 DIAGNOSIS — C7940 Secondary malignant neoplasm of unspecified part of nervous system: Secondary | ICD-10-CM | POA: Diagnosis not present

## 2017-01-12 DIAGNOSIS — W07XXXA Fall from chair, initial encounter: Secondary | ICD-10-CM | POA: Diagnosis present

## 2017-01-12 DIAGNOSIS — Z87891 Personal history of nicotine dependence: Secondary | ICD-10-CM

## 2017-01-12 DIAGNOSIS — F39 Unspecified mood [affective] disorder: Secondary | ICD-10-CM | POA: Diagnosis present

## 2017-01-12 DIAGNOSIS — G43909 Migraine, unspecified, not intractable, without status migrainosus: Secondary | ICD-10-CM | POA: Diagnosis present

## 2017-01-12 DIAGNOSIS — M25552 Pain in left hip: Secondary | ICD-10-CM | POA: Diagnosis not present

## 2017-01-12 DIAGNOSIS — M79604 Pain in right leg: Secondary | ICD-10-CM | POA: Diagnosis present

## 2017-01-12 DIAGNOSIS — Z91048 Other nonmedicinal substance allergy status: Secondary | ICD-10-CM

## 2017-01-12 DIAGNOSIS — Z515 Encounter for palliative care: Secondary | ICD-10-CM | POA: Diagnosis not present

## 2017-01-12 DIAGNOSIS — I129 Hypertensive chronic kidney disease with stage 1 through stage 4 chronic kidney disease, or unspecified chronic kidney disease: Secondary | ICD-10-CM | POA: Diagnosis present

## 2017-01-12 DIAGNOSIS — M069 Rheumatoid arthritis, unspecified: Secondary | ICD-10-CM | POA: Diagnosis present

## 2017-01-12 DIAGNOSIS — C7931 Secondary malignant neoplasm of brain: Secondary | ICD-10-CM | POA: Diagnosis present

## 2017-01-12 DIAGNOSIS — M8580 Other specified disorders of bone density and structure, unspecified site: Secondary | ICD-10-CM | POA: Diagnosis present

## 2017-01-12 DIAGNOSIS — Z881 Allergy status to other antibiotic agents status: Secondary | ICD-10-CM

## 2017-01-12 DIAGNOSIS — S72142D Displaced intertrochanteric fracture of left femur, subsequent encounter for closed fracture with routine healing: Secondary | ICD-10-CM | POA: Diagnosis not present

## 2017-01-12 DIAGNOSIS — I10 Essential (primary) hypertension: Secondary | ICD-10-CM | POA: Diagnosis not present

## 2017-01-12 DIAGNOSIS — F418 Other specified anxiety disorders: Secondary | ICD-10-CM | POA: Diagnosis present

## 2017-01-12 DIAGNOSIS — Z7189 Other specified counseling: Secondary | ICD-10-CM

## 2017-01-12 DIAGNOSIS — M479 Spondylosis, unspecified: Secondary | ICD-10-CM | POA: Diagnosis present

## 2017-01-12 DIAGNOSIS — G893 Neoplasm related pain (acute) (chronic): Secondary | ICD-10-CM | POA: Diagnosis not present

## 2017-01-12 DIAGNOSIS — Z880 Allergy status to penicillin: Secondary | ICD-10-CM

## 2017-01-12 DIAGNOSIS — F05 Delirium due to known physiological condition: Secondary | ICD-10-CM | POA: Diagnosis not present

## 2017-01-12 DIAGNOSIS — N183 Chronic kidney disease, stage 3 unspecified: Secondary | ICD-10-CM | POA: Diagnosis present

## 2017-01-12 DIAGNOSIS — Y92099 Unspecified place in other non-institutional residence as the place of occurrence of the external cause: Secondary | ICD-10-CM | POA: Diagnosis not present

## 2017-01-12 DIAGNOSIS — C3490 Malignant neoplasm of unspecified part of unspecified bronchus or lung: Secondary | ICD-10-CM

## 2017-01-12 DIAGNOSIS — D492 Neoplasm of unspecified behavior of bone, soft tissue, and skin: Secondary | ICD-10-CM | POA: Diagnosis not present

## 2017-01-12 DIAGNOSIS — Z888 Allergy status to other drugs, medicaments and biological substances status: Secondary | ICD-10-CM

## 2017-01-12 DIAGNOSIS — Z01811 Encounter for preprocedural respiratory examination: Secondary | ICD-10-CM | POA: Diagnosis not present

## 2017-01-12 DIAGNOSIS — Z91030 Bee allergy status: Secondary | ICD-10-CM

## 2017-01-12 DIAGNOSIS — M533 Sacrococcygeal disorders, not elsewhere classified: Secondary | ICD-10-CM | POA: Diagnosis present

## 2017-01-12 DIAGNOSIS — I509 Heart failure, unspecified: Secondary | ICD-10-CM | POA: Diagnosis not present

## 2017-01-12 DIAGNOSIS — G939 Disorder of brain, unspecified: Secondary | ICD-10-CM | POA: Diagnosis not present

## 2017-01-12 DIAGNOSIS — Z7982 Long term (current) use of aspirin: Secondary | ICD-10-CM

## 2017-01-12 DIAGNOSIS — M84552A Pathological fracture in neoplastic disease, left femur, initial encounter for fracture: Secondary | ICD-10-CM | POA: Diagnosis not present

## 2017-01-12 DIAGNOSIS — G936 Cerebral edema: Secondary | ICD-10-CM | POA: Diagnosis present

## 2017-01-12 DIAGNOSIS — I25810 Atherosclerosis of coronary artery bypass graft(s) without angina pectoris: Secondary | ICD-10-CM | POA: Diagnosis not present

## 2017-01-12 DIAGNOSIS — C787 Secondary malignant neoplasm of liver and intrahepatic bile duct: Secondary | ICD-10-CM | POA: Diagnosis present

## 2017-01-12 DIAGNOSIS — W19XXXA Unspecified fall, initial encounter: Secondary | ICD-10-CM | POA: Diagnosis not present

## 2017-01-12 DIAGNOSIS — F411 Generalized anxiety disorder: Secondary | ICD-10-CM | POA: Diagnosis not present

## 2017-01-12 DIAGNOSIS — I2510 Atherosclerotic heart disease of native coronary artery without angina pectoris: Secondary | ICD-10-CM | POA: Diagnosis present

## 2017-01-12 DIAGNOSIS — R4182 Altered mental status, unspecified: Secondary | ICD-10-CM | POA: Diagnosis not present

## 2017-01-12 DIAGNOSIS — R200 Anesthesia of skin: Secondary | ICD-10-CM | POA: Diagnosis not present

## 2017-01-12 DIAGNOSIS — Z886 Allergy status to analgesic agent status: Secondary | ICD-10-CM

## 2017-01-12 DIAGNOSIS — C7951 Secondary malignant neoplasm of bone: Secondary | ICD-10-CM | POA: Diagnosis not present

## 2017-01-12 DIAGNOSIS — C419 Malignant neoplasm of bone and articular cartilage, unspecified: Secondary | ICD-10-CM | POA: Diagnosis not present

## 2017-01-12 DIAGNOSIS — M1612 Unilateral primary osteoarthritis, left hip: Secondary | ICD-10-CM | POA: Diagnosis not present

## 2017-01-12 DIAGNOSIS — J432 Centrilobular emphysema: Secondary | ICD-10-CM | POA: Diagnosis not present

## 2017-01-12 DIAGNOSIS — Z419 Encounter for procedure for purposes other than remedying health state, unspecified: Secondary | ICD-10-CM

## 2017-01-12 DIAGNOSIS — M79606 Pain in leg, unspecified: Secondary | ICD-10-CM | POA: Diagnosis not present

## 2017-01-12 DIAGNOSIS — Z79899 Other long term (current) drug therapy: Secondary | ICD-10-CM

## 2017-01-12 DIAGNOSIS — Z8711 Personal history of peptic ulcer disease: Secondary | ICD-10-CM

## 2017-01-12 DIAGNOSIS — J449 Chronic obstructive pulmonary disease, unspecified: Secondary | ICD-10-CM | POA: Diagnosis present

## 2017-01-12 DIAGNOSIS — S72492A Other fracture of lower end of left femur, initial encounter for closed fracture: Secondary | ICD-10-CM | POA: Diagnosis not present

## 2017-01-12 DIAGNOSIS — N189 Chronic kidney disease, unspecified: Secondary | ICD-10-CM | POA: Diagnosis not present

## 2017-01-12 DIAGNOSIS — S7290XA Unspecified fracture of unspecified femur, initial encounter for closed fracture: Secondary | ICD-10-CM | POA: Diagnosis present

## 2017-01-12 DIAGNOSIS — M84453A Pathological fracture, unspecified femur, initial encounter for fracture: Secondary | ICD-10-CM

## 2017-01-12 DIAGNOSIS — K219 Gastro-esophageal reflux disease without esophagitis: Secondary | ICD-10-CM | POA: Diagnosis not present

## 2017-01-12 DIAGNOSIS — Z01818 Encounter for other preprocedural examination: Secondary | ICD-10-CM | POA: Diagnosis not present

## 2017-01-12 DIAGNOSIS — Z66 Do not resuscitate: Secondary | ICD-10-CM | POA: Diagnosis present

## 2017-01-12 DIAGNOSIS — M25551 Pain in right hip: Secondary | ICD-10-CM | POA: Diagnosis not present

## 2017-01-12 DIAGNOSIS — C801 Malignant (primary) neoplasm, unspecified: Secondary | ICD-10-CM | POA: Diagnosis not present

## 2017-01-12 DIAGNOSIS — M545 Low back pain: Secondary | ICD-10-CM | POA: Diagnosis present

## 2017-01-12 DIAGNOSIS — S728X2A Other fracture of left femur, initial encounter for closed fracture: Secondary | ICD-10-CM | POA: Diagnosis not present

## 2017-01-12 DIAGNOSIS — L299 Pruritus, unspecified: Secondary | ICD-10-CM | POA: Diagnosis not present

## 2017-01-12 DIAGNOSIS — R4020 Unspecified coma: Secondary | ICD-10-CM | POA: Diagnosis not present

## 2017-01-12 DIAGNOSIS — Z884 Allergy status to anesthetic agent status: Secondary | ICD-10-CM

## 2017-01-12 DIAGNOSIS — J439 Emphysema, unspecified: Secondary | ICD-10-CM | POA: Diagnosis present

## 2017-01-12 DIAGNOSIS — Z8619 Personal history of other infectious and parasitic diseases: Secondary | ICD-10-CM

## 2017-01-12 DIAGNOSIS — T148XXA Other injury of unspecified body region, initial encounter: Secondary | ICD-10-CM | POA: Diagnosis not present

## 2017-01-12 HISTORY — PX: FEMUR IM NAIL: SHX1597

## 2017-01-12 LAB — BASIC METABOLIC PANEL
ANION GAP: 8 (ref 5–15)
BUN: 15 mg/dL (ref 6–20)
CALCIUM: 9.7 mg/dL (ref 8.9–10.3)
CHLORIDE: 106 mmol/L (ref 101–111)
CO2: 28 mmol/L (ref 22–32)
Creatinine, Ser: 0.9 mg/dL (ref 0.44–1.00)
GFR calc non Af Amer: 59 mL/min — ABNORMAL LOW (ref >60)
Glucose, Bld: 120 mg/dL — ABNORMAL HIGH (ref 65–99)
Potassium: 4.2 mmol/L (ref 3.5–5.1)
SODIUM: 142 mmol/L (ref 135–145)

## 2017-01-12 LAB — CBC
HEMATOCRIT: 37.7 % (ref 36.0–46.0)
HEMOGLOBIN: 12.3 g/dL (ref 12.0–15.0)
MCH: 31.5 pg (ref 26.0–34.0)
MCHC: 32.6 g/dL (ref 30.0–36.0)
MCV: 96.4 fL (ref 78.0–100.0)
Platelets: 253 10*3/uL (ref 150–400)
RBC: 3.91 MIL/uL (ref 3.87–5.11)
RDW: 14.1 % (ref 11.5–15.5)
WBC: 8.9 10*3/uL (ref 4.0–10.5)

## 2017-01-12 SURGERY — INSERTION, INTRAMEDULLARY ROD, FEMUR, RETROGRADE
Anesthesia: Spinal | Site: Hip | Laterality: Left

## 2017-01-12 MED ORDER — PHENYLEPHRINE HCL 10 MG/ML IJ SOLN
INTRAVENOUS | Status: DC | PRN
  Administered 2017-01-12: 30 ug/min via INTRAVENOUS

## 2017-01-12 MED ORDER — METOCLOPRAMIDE HCL 5 MG/ML IJ SOLN: 5.0000 mg | Freq: Three times a day (TID) | INTRAMUSCULAR | Status: DC | PRN

## 2017-01-12 MED ORDER — ACETAMINOPHEN 500 MG PO TABS
1000.0000 mg | ORAL_TABLET | Freq: Three times a day (TID) | ORAL | Status: DC
  Administered 2017-01-12 – 2017-01-21 (×23): 1000 mg via ORAL
  Filled 2017-01-12 (×24): qty 2

## 2017-01-12 MED ORDER — ALPRAZOLAM 0.25 MG PO TABS
0.1250 mg | ORAL_TABLET | Freq: Two times a day (BID) | ORAL | Status: DC | PRN
  Administered 2017-01-12 – 2017-01-16 (×4): 0.125 mg via ORAL
  Filled 2017-01-12 (×5): qty 1

## 2017-01-12 MED ORDER — MORPHINE SULFATE (PF) 4 MG/ML IV SOLN
6.0000 mg | Freq: Once | INTRAVENOUS | Status: AC
  Administered 2017-01-12: 6 mg via INTRAVENOUS
  Filled 2017-01-12: qty 2

## 2017-01-12 MED ORDER — OXYCODONE HCL 5 MG PO TABS
10.0000 mg | ORAL_TABLET | ORAL | Status: DC | PRN
  Administered 2017-01-12 – 2017-01-17 (×4): 10 mg via ORAL
  Filled 2017-01-12 (×4): qty 2

## 2017-01-12 MED ORDER — METHOCARBAMOL 1000 MG/10ML IJ SOLN
1000.0000 mg | Freq: Once | INTRAVENOUS | Status: DC
  Filled 2017-01-12: qty 10

## 2017-01-12 MED ORDER — PHENYLEPHRINE HCL 10 MG/ML IJ SOLN
INTRAMUSCULAR | Status: AC
  Filled 2017-01-12: qty 1

## 2017-01-12 MED ORDER — ACETAMINOPHEN 650 MG RE SUPP: 650.0000 mg | RECTAL | Status: DC | PRN

## 2017-01-12 MED ORDER — FENTANYL CITRATE (PF) 250 MCG/5ML IJ SOLN
INTRAMUSCULAR | Status: AC
  Filled 2017-01-12: qty 5

## 2017-01-12 MED ORDER — OXYCODONE HCL 5 MG PO TABS
5.0000 mg | ORAL_TABLET | ORAL | Status: DC | PRN
  Administered 2017-01-16: 5 mg via ORAL
  Filled 2017-01-12 (×2): qty 1

## 2017-01-12 MED ORDER — KETAMINE HCL 10 MG/ML IJ SOLN
INTRAMUSCULAR | Status: DC | PRN
  Administered 2017-01-12: 60 mg via INTRAVENOUS
  Administered 2017-01-12: 40 mg via INTRAVENOUS

## 2017-01-12 MED ORDER — ONDANSETRON HCL 4 MG/2ML IJ SOLN
INTRAMUSCULAR | Status: DC | PRN
  Administered 2017-01-12: 4 mg via INTRAVENOUS

## 2017-01-12 MED ORDER — METOCLOPRAMIDE HCL 5 MG PO TABS: 5.0000 mg | ORAL_TABLET | Freq: Three times a day (TID) | ORAL | Status: DC | PRN

## 2017-01-12 MED ORDER — MEPERIDINE HCL 50 MG/ML IJ SOLN: 6.2500 mg | INTRAMUSCULAR | Status: DC | PRN

## 2017-01-12 MED ORDER — PROPOFOL 10 MG/ML IV BOLUS
INTRAVENOUS | Status: AC
  Filled 2017-01-12: qty 20

## 2017-01-12 MED ORDER — KETAMINE HCL 10 MG/ML IJ SOLN
INTRAMUSCULAR | Status: AC
  Filled 2017-01-12: qty 1

## 2017-01-12 MED ORDER — GABAPENTIN 300 MG PO CAPS
300.0000 mg | ORAL_CAPSULE | Freq: Every day | ORAL | Status: DC
  Administered 2017-01-12 – 2017-01-17 (×5): 300 mg via ORAL
  Filled 2017-01-12 (×6): qty 1

## 2017-01-12 MED ORDER — PHENYLEPHRINE 40 MCG/ML (10ML) SYRINGE FOR IV PUSH (FOR BLOOD PRESSURE SUPPORT)
PREFILLED_SYRINGE | INTRAVENOUS | Status: AC
  Filled 2017-01-12: qty 10

## 2017-01-12 MED ORDER — MORPHINE SULFATE (PF) 4 MG/ML IV SOLN
4.0000 mg | INTRAVENOUS | Status: DC | PRN
  Administered 2017-01-12 – 2017-01-17 (×15): 4 mg via INTRAVENOUS
  Filled 2017-01-12 (×16): qty 1

## 2017-01-12 MED ORDER — MIDAZOLAM HCL 5 MG/5ML IJ SOLN
INTRAMUSCULAR | Status: DC | PRN
  Administered 2017-01-12: 1 mg via INTRAVENOUS

## 2017-01-12 MED ORDER — CARVEDILOL 25 MG PO TABS
25.0000 mg | ORAL_TABLET | Freq: Two times a day (BID) | ORAL | Status: DC
  Administered 2017-01-12 – 2017-01-21 (×17): 25 mg via ORAL
  Filled 2017-01-12 (×18): qty 1

## 2017-01-12 MED ORDER — ONDANSETRON HCL 4 MG/2ML IJ SOLN
INTRAMUSCULAR | Status: AC
  Filled 2017-01-12: qty 2

## 2017-01-12 MED ORDER — PHENYLEPHRINE HCL 10 MG/ML IJ SOLN
INTRAMUSCULAR | Status: DC | PRN
  Administered 2017-01-12: 120 ug via INTRAVENOUS

## 2017-01-12 MED ORDER — LACTATED RINGERS IV SOLN
INTRAVENOUS | Status: DC
  Administered 2017-01-12 (×2): via INTRAVENOUS

## 2017-01-12 MED ORDER — FENTANYL CITRATE (PF) 100 MCG/2ML IJ SOLN: 25.0000 ug | INTRAMUSCULAR | Status: DC | PRN

## 2017-01-12 MED ORDER — GABAPENTIN 100 MG PO CAPS
200.0000 mg | ORAL_CAPSULE | Freq: Two times a day (BID) | ORAL | Status: DC
  Administered 2017-01-13 – 2017-01-18 (×12): 200 mg via ORAL
  Filled 2017-01-12 (×12): qty 2

## 2017-01-12 MED ORDER — ALBUTEROL SULFATE (2.5 MG/3ML) 0.083% IN NEBU: 2.5000 mg | INHALATION_SOLUTION | Freq: Four times a day (QID) | RESPIRATORY_TRACT | Status: DC | PRN

## 2017-01-12 MED ORDER — MIDAZOLAM HCL 2 MG/2ML IJ SOLN
INTRAMUSCULAR | Status: AC
  Filled 2017-01-12: qty 2

## 2017-01-12 MED ORDER — ONDANSETRON HCL 4 MG/2ML IJ SOLN: 4.0000 mg | Freq: Four times a day (QID) | INTRAMUSCULAR | Status: DC | PRN

## 2017-01-12 MED ORDER — POVIDONE-IODINE 10 % EX SWAB
2.0000 "application " | Freq: Once | CUTANEOUS | Status: AC
  Administered 2017-01-12: 2 via TOPICAL

## 2017-01-12 MED ORDER — PROPOFOL 500 MG/50ML IV EMUL
INTRAVENOUS | Status: DC | PRN
  Administered 2017-01-12: 25 ug/kg/min via INTRAVENOUS

## 2017-01-12 MED ORDER — CHLORHEXIDINE GLUCONATE 4 % EX LIQD
60.0000 mL | Freq: Once | CUTANEOUS | Status: DC
  Filled 2017-01-12: qty 60

## 2017-01-12 MED ORDER — HYDROMORPHONE HCL 1 MG/ML IJ SOLN
0.5000 mg | INTRAMUSCULAR | Status: DC | PRN
  Administered 2017-01-12 – 2017-01-16 (×4): 0.5 mg via INTRAVENOUS
  Filled 2017-01-12 (×4): qty 1

## 2017-01-12 MED ORDER — SODIUM CHLORIDE 0.9 % IJ SOLN
INTRAMUSCULAR | Status: AC
  Filled 2017-01-12: qty 10

## 2017-01-12 MED ORDER — DEXAMETHASONE SODIUM PHOSPHATE 10 MG/ML IJ SOLN
INTRAMUSCULAR | Status: AC
  Filled 2017-01-12: qty 1

## 2017-01-12 MED ORDER — KCL IN DEXTROSE-NACL 20-5-0.45 MEQ/L-%-% IV SOLN
INTRAVENOUS | Status: DC
  Administered 2017-01-12: 20:00:00 via INTRAVENOUS
  Filled 2017-01-12 (×3): qty 1000

## 2017-01-12 MED ORDER — ACETAMINOPHEN 325 MG PO TABS: 650.0000 mg | ORAL_TABLET | ORAL | Status: DC | PRN

## 2017-01-12 MED ORDER — CLINDAMYCIN PHOSPHATE 900 MG/50ML IV SOLN
900.0000 mg | INTRAVENOUS | Status: AC
  Administered 2017-01-12: 900 mg via INTRAVENOUS

## 2017-01-12 MED ORDER — 0.9 % SODIUM CHLORIDE (POUR BTL) OPTIME
TOPICAL | Status: DC | PRN
  Administered 2017-01-12: 1000 mL

## 2017-01-12 MED ORDER — METOCLOPRAMIDE HCL 5 MG/ML IJ SOLN: 10.0000 mg | Freq: Once | INTRAMUSCULAR | Status: DC | PRN

## 2017-01-12 MED ORDER — ONDANSETRON HCL 4 MG PO TABS: 4.0000 mg | ORAL_TABLET | Freq: Four times a day (QID) | ORAL | Status: DC | PRN

## 2017-01-12 MED ORDER — CLINDAMYCIN PHOSPHATE 900 MG/50ML IV SOLN
INTRAVENOUS | Status: AC
  Filled 2017-01-12: qty 50

## 2017-01-12 MED ORDER — ONDANSETRON HCL 4 MG PO TABS
4.0000 mg | ORAL_TABLET | Freq: Four times a day (QID) | ORAL | Status: DC | PRN
  Filled 2017-01-12: qty 1

## 2017-01-12 MED ORDER — ENOXAPARIN SODIUM 40 MG/0.4ML ~~LOC~~ SOLN
40.0000 mg | SUBCUTANEOUS | Status: DC
  Administered 2017-01-13 – 2017-01-21 (×9): 40 mg via SUBCUTANEOUS
  Filled 2017-01-12 (×9): qty 0.4

## 2017-01-12 MED ORDER — ONDANSETRON HCL 4 MG/2ML IJ SOLN
4.0000 mg | Freq: Four times a day (QID) | INTRAMUSCULAR | Status: DC | PRN
  Administered 2017-01-17: 4 mg via INTRAVENOUS
  Filled 2017-01-12: qty 2

## 2017-01-12 MED ORDER — ARTIFICIAL TEARS OP OINT
TOPICAL_OINTMENT | OPHTHALMIC | Status: AC
  Filled 2017-01-12: qty 3.5

## 2017-01-12 MED ORDER — FENTANYL CITRATE (PF) 100 MCG/2ML IJ SOLN
INTRAMUSCULAR | Status: DC | PRN
  Administered 2017-01-12 (×2): 50 ug via INTRAVENOUS

## 2017-01-12 SURGICAL SUPPLY — 57 items
BAG SPEC THK2 15X12 ZIP CLS (MISCELLANEOUS) ×1
BAG ZIPLOCK 12X15 (MISCELLANEOUS) ×3 IMPLANT
BANDAGE ACE 6X5 VEL STRL LF (GAUZE/BANDAGES/DRESSINGS) ×3 IMPLANT
BANDAGE ELASTIC 6 VELCRO ST LF (GAUZE/BANDAGES/DRESSINGS) ×2 IMPLANT
BIT DRILL CALIBRATED 4.3MMX365 (DRILL) IMPLANT
BIT DRILL CROWE PNT TWST 4.5MM (DRILL) IMPLANT
BNDG COHESIVE 6X5 TAN STRL LF (GAUZE/BANDAGES/DRESSINGS) ×3 IMPLANT
BNDG GAUZE ELAST 4 BULKY (GAUZE/BANDAGES/DRESSINGS) ×3 IMPLANT
COVER SURGICAL LIGHT HANDLE (MISCELLANEOUS) ×3 IMPLANT
DRAPE C-ARM 42X120 X-RAY (DRAPES) ×2 IMPLANT
DRAPE C-ARMOR (DRAPES) ×2 IMPLANT
DRAPE INCISE IOBAN 66X45 STRL (DRAPES) ×3 IMPLANT
DRAPE ORTHO SPLIT 87X125 STRL (DRAPES) ×4 IMPLANT
DRAPE STERI IOBAN 125X83 (DRAPES) ×3 IMPLANT
DRAPE U-SHAPE 47X51 STRL (DRAPES) ×3 IMPLANT
DRILL CALIBRATED 4.3MMX365 (DRILL) ×3
DRILL CROWE POINT TWIST 4.5MM (DRILL) ×3
DRSG AQUACEL AG ADV 3.5X 4 (GAUZE/BANDAGES/DRESSINGS) ×6 IMPLANT
DRSG AQUACEL AG ADV 3.5X 6 (GAUZE/BANDAGES/DRESSINGS) ×3 IMPLANT
DRSG MEPILEX BORDER 4X4 (GAUZE/BANDAGES/DRESSINGS) ×2 IMPLANT
DRSG PAD ABDOMINAL 8X10 ST (GAUZE/BANDAGES/DRESSINGS) ×6 IMPLANT
DURAPREP 26ML APPLICATOR (WOUND CARE) ×3 IMPLANT
ELECT REM PT RETURN 15FT ADLT (MISCELLANEOUS) ×3 IMPLANT
GAUZE SPONGE 4X4 12PLY STRL (GAUZE/BANDAGES/DRESSINGS) ×3 IMPLANT
GLOVE BIOGEL PI IND STRL 7.5 (GLOVE) ×1 IMPLANT
GLOVE BIOGEL PI IND STRL 8 (GLOVE) ×1 IMPLANT
GLOVE BIOGEL PI INDICATOR 7.5 (GLOVE) ×2
GLOVE BIOGEL PI INDICATOR 8 (GLOVE) ×2
GLOVE ECLIPSE 8.0 STRL XLNG CF (GLOVE) IMPLANT
GLOVE ORTHO TXT STRL SZ7.5 (GLOVE) ×6 IMPLANT
GLOVE SURG ORTHO 8.0 STRL STRW (GLOVE) ×3 IMPLANT
GOWN SPEC L3 XXLG W/TWL (GOWN DISPOSABLE) ×6 IMPLANT
GOWN STRL REUS W/TWL LRG LVL3 (GOWN DISPOSABLE) ×3 IMPLANT
GUIDEPIN 3.2X17.5 THRD DISP (PIN) ×2 IMPLANT
GUIDEWIRE BEAD TIP (WIRE) ×4 IMPLANT
KIT BASIN OR (CUSTOM PROCEDURE TRAY) ×3 IMPLANT
MANIFOLD NEPTUNE II (INSTRUMENTS) ×3 IMPLANT
NAIL FEM RETRO 10.5X340 (Nail) ×2 IMPLANT
PACK GENERAL/GYN (CUSTOM PROCEDURE TRAY) ×3 IMPLANT
PAD ABD 8X10 STRL (GAUZE/BANDAGES/DRESSINGS) ×2 IMPLANT
PAD CAST 4YDX4 CTTN HI CHSV (CAST SUPPLIES) ×1 IMPLANT
PADDING CAST COTTON 4X4 STRL (CAST SUPPLIES) ×3
POSITIONER SURGICAL ARM (MISCELLANEOUS) ×6 IMPLANT
SCREW CORT TI DBL LEAD 5X30 (Screw) ×2 IMPLANT
SCREW CORT TI DBL LEAD 5X56 (Screw) ×2 IMPLANT
SCREW CORT TI DBL LEAD 5X60 (Screw) ×2 IMPLANT
SCREW CORT TI DBL LEAD 5X65 (Screw) ×4 IMPLANT
STAPLER VISISTAT 35W (STAPLE) ×1 IMPLANT
SUT ETHILON 3 0 PS 1 (SUTURE) ×4 IMPLANT
SUT MNCRL AB 3-0 PS2 18 (SUTURE) ×2 IMPLANT
SUT MNCRL AB 4-0 PS2 18 (SUTURE) ×3 IMPLANT
SUT VIC AB 0 CT1 27 (SUTURE) ×6
SUT VIC AB 0 CT1 27XBRD ANTBC (SUTURE) ×2 IMPLANT
SUT VIC AB 2-0 CT1 27 (SUTURE) ×6
SUT VIC AB 2-0 CT1 27XBRD (SUTURE) ×2 IMPLANT
TOWEL OR 17X26 10 PK STRL BLUE (TOWEL DISPOSABLE) ×6 IMPLANT
TOWEL OR NON WOVEN STRL DISP B (DISPOSABLE) ×2 IMPLANT

## 2017-01-12 NOTE — ED Notes (Signed)
RN from [facility] has come to check on patient and will follow her as she gets admitted.

## 2017-01-12 NOTE — Transfer of Care (Signed)
Immediate Anesthesia Transfer of Care Note  Patient: Melissa Ferrell  Procedure(s) Performed: INTRAMEDULLARY (IM) RETROGRADE FEMORAL NAILING (Left Hip)  Patient Location: PACU  Anesthesia Type:Spinal  Level of Consciousness: awake, alert , oriented and patient cooperative  Airway & Oxygen Therapy: Patient Spontanous Breathing and Patient connected to face mask oxygen  Post-op Assessment: Report given to RN and Post -op Vital signs reviewed and stable  Post vital signs: stable  Last Vitals:  Vitals:   01/12/17 1402 01/12/17 1500  BP: (!) 152/84 (!) 153/74  Pulse: 73 72  Resp: 18 16  Temp:    SpO2: 97% 98%    Last Pain:  Vitals:   01/12/17 1315  TempSrc:   PainSc: 6          Complications: No apparent anesthesia complications

## 2017-01-12 NOTE — Anesthesia Procedure Notes (Signed)
Spinal  Patient location during procedure: OR End time: 01/12/2017 3:43 PM Staffing Anesthesiologist: Montez Hageman, MD Performed: anesthesiologist  Preanesthetic Checklist Completed: patient identified, site marked, surgical consent, pre-op evaluation, timeout performed, IV checked, risks and benefits discussed and monitors and equipment checked Spinal Block Patient position: sitting Prep: DuraPrep Patient monitoring: heart rate, continuous pulse ox and blood pressure Approach: right paramedian Location: L3-4 Injection technique: single-shot Needle Needle type: Spinocan  Needle gauge: 22 G Needle length: 9 cm Additional Notes Expiration date of kit checked and confirmed. Patient tolerated procedure well, without complications.

## 2017-01-12 NOTE — Op Note (Signed)
01/12/2017  5:16 PM  PATIENT:  Melissa Ferrell  81 y.o. female  PRE-OPERATIVE DIAGNOSIS:  Left distal femur pathologic fracture  POST-OPERATIVE DIAGNOSIS:  Left distal femur pathologic fracture  Procedure(s):  Open treatment of left femur fracture with retrograde intramedullary nailing  SURGEON:  Wylene Simmer, MD  ASSISTANT:  none  ANESTHESIA:   spinal  EBL:  100 cc  TOURNIQUET:  n/a  COMPLICATIONS:  None apparent  DISPOSITION:  Extubated, awake and stable to recovery.  INDICATION FOR PROCEDURE: The patient is a 81 year old female with a history of bone metastases recently diagnosed and evaluated by Dr. Nydia Bouton at [facility].  She was in bed this morning and got up to get to her wheelchair to go to the bathroom when she fell.  She had immediate pain in her left lower extremity.  She was seen in the emergency department where x-rays revealed a pathologic fracture of the distal femur.  She presents now for operative treatment of this displaced and unstable fracture.  The risks and benefits of the alternative treatment options have been discussed in detail.  The patient wishes to proceed with surgery and specifically understands risks of bleeding, infection, nerve damage, blood clots, need for additional surgery, amputation and death.  PROCEDURE IN DETAIL:  After pre operative consent was obtained, and the correct operative site was identified, the patient was brought to the operating room and placed supine on the OR table.  Anesthesia was administered.  Pre-operative antibiotics were administered.  A surgical timeout was taken.  The left lower extremity was prepped and draped in standard sterile fashion.  A longitudinal incision was made distal to the patella.  Dissection was carried down through the subcutaneous tissues to the patellar tendon.  It was incised in the midline and split in line with its fibers.  Blunt dissection was then carried down through the fat pad to  the intercondylar notch.  A guidepin was inserted in line with the femoral canal on the AP and lateral radiographic views.  A starter reamer was then introduced over the guidepin to the appropriate depth.  The guidepin was removed and a ball-tipped guidewire was advanced.  Appropriate reduction was held at the fracture site while the guidepin was advanced across the fracture into the proximal femoral canal.  The guidewire was advanced past the lesser trochanter.  The femoral canal was then sequentially reamed to a diameter of 12 mm.  Medullary contents from the reamer were sent as a specimen to pathology.  The pathology intraoperative report indicated metastatic malignant carcinoma.  A 10.5 mm Biomet [place] retrograde femoral nail was then inserted over the guidewire and seated appropriately at the intercondylar notch.  The targeting guide was then used to insert for interlocking screws at the distal portion of the femur in percutaneous fashion.  The screws were then locked to the nail by tightening the distal locking bolt.  The perfect circle technique was used to insert a static interlocking screw from anterior to posterior at the proximal end of the nail.  Final AP and lateral radiographs at the proximal end of the nail, the fracture site and the distal end of the nail showed appropriate position and length of all hardware in appropriate reduction of the fracture.  The knee joint was then irrigated copiously.  The patellar tendon was repaired with figure-of-eight sutures of 0 Vicryl.  The retinaculum and subtendinous tissues were approximated with inverted simple sutures of 3-0 Monocryl.  The skin incision was closed  with a running 3-0 nylon.  The stab incisions were all irrigated and closed with 3-0 nylon horizontal mattress sutures.  Sterile dressings were applied followed by a compression wrap.  The patient was then awakened from anesthesia and transported to the recovery room in stable  condition.  FOLLOW UP PLAN: The patient will be admitted to the hospitalist service with consultation from hospice and palliative care.  We will plan to start physical therapy tomorrow.  Weightbearing as tolerated on the left lower extremity.  She can resume blood thinners tomorrow.

## 2017-01-12 NOTE — H&P (Signed)
History and Physical    Melissa Ferrell JIR:[id number] DOB: 1935/05/23 DOA: 01/12/2017  PCP: Girtha Rm, NP-C  Patient coming from:  home  Chief Complaint:  Fall, back pain  HPI: Melissa Ferrell is a 81 y.o. female with medical history significant of CAD, RA, HTN, GERD, COPD, CKD with unknown cancer type at [facility] (she does not want it worked up and no treatment on hospice) comes in after slipping off of her chair and injuring her leg.  She is living in independent living still ambulating, just recently placed on hospice supportive care for pain control.  She has been found to have left distal femur fracture and referred for admission for surgical repair by orthopedic team.  No other previous recent illnesses.  Review of Systems: As per HPI otherwise 10 point review of systems negative.   Past Medical History:  Diagnosis Date  . Anxiety and depression   . Carotid artery disease (HCC)    hx CAD, dCHF listed in [place] - sees cardiologist, vasc and neurologist  . CKD (chronic kidney disease) stage 3, GFR 30-59 ml/min ([place]) 08/24/2016  . COPD (chronic obstructive pulmonary disease) [facility])    sees pulmonology  . DDD (degenerative disc disease)   . Disseminated superficial actinic porokeratosis    bilateral calves  . Fracture, spinal   . GERD (gastroesophageal reflux disease)    hx PUD 2ndary to NSAIDs 1975  . Hemorrhoids   . HTN (hypertension)   . Hx of adenomatous colonic polyps   . Hyperlipidemia    intol to statins  . IBS (irritable bowel syndrome)   . Memory loss    seen by neurology in 2015 and referred to neuropsych for anxiety  . Migraine headache   . Osteopenia determined by x-ray   . RHEUMATIC FEVER, HX OF 10/15/2006   Qualifier: Diagnosis of  By: Jenny Reichmann MD, Hunt Oris   . Rheumatoid arthritis(714.0)    per her report, in remission for many years per her report  . Varicose veins    hx LE edema    Past Surgical History:  Procedure Laterality Date  . chronically infected  lymph node removed  1973  . THYROGLOSSAL DUCT CYST     1943, [place]     reports that she quit smoking about 32 years ago. Her smoking use included cigarettes. She has a 90.00 pack-year smoking history. she has never used smokeless tobacco. She reports that she does not drink alcohol or use drugs.  Allergies  Allergen Reactions  . Atrovent [Ipratropium] Anaphylaxis  . Bee Venom Anaphylaxis  . Qvar [Beclomethasone] Anaphylaxis  . Stiolto Respimat [Tiotropium Bromide-Olodaterol] Anaphylaxis  . Anoro Ellipta [Umeclidinium-Vilanterol]     HEADACHES  . Prednisone Other (See Comments)    Per patient: was told that she was not to take it by her early Rheumatology physician  . Benzocaine Itching and Other (See Comments)    Blisters  . Breo Ellipta [Fluticasone Furoate-Vilanterol]     Headache, anxiety.   Thayer Jew Hcl]   . Cefdinir Other (See Comments)    unknown  . Ciprofloxacin Other (See Comments)    unknown  . Diphenhydramine Hcl Other (See Comments)    "benadryl"--pain   . Erythromycin Other (See Comments)    Headache   . Indomethacin Other (See Comments)    Headache   . Lasix [Furosemide] Other (See Comments)    Low tolerance - swelling    . Other     Adhesive tape--blisters  . Statins  Low tolerance    . Tobramycin Itching  . Zetia [Ezetimibe]     Low tolerance   . Azithromycin Rash  . Paba Derivatives Rash  . Penicillins Itching and Rash    Has patient had a PCN reaction causing immediate rash, facial/tongue/throat swelling, SOB or lightheadedness with hypotension: No Has patient had a PCN reaction causing severe rash involving mucus membranes or skin necrosis: No Has patient had a PCN reaction that required hospitalization: No Has patient had a PCN reaction occurring within the last 10 years: No If all of the above answers are "NO", then may proceed with Cephalosporin use.     Family History  Problem Relation Age of Onset  . Heart disease  Mother        MI  . Hypertension Mother   . Heart attack Mother   . Brain cancer Father   . Cancer Father   . Breast cancer Maternal Grandmother   . Stomach cancer Brother   . Cancer Brother   . Heart disease Brother   . Hypertension Brother   . Heart attack Brother   . AAA (abdominal aortic aneurysm) Brother   . Aneurysm Brother        brain  . Melanoma Brother   . Colon cancer Brother   . Heart disease Brother   . Alcohol abuse Unknown   . Heart attack Brother 40  . Allergies Sister   . Hypertension Sister     Prior to Admission medications   Medication Sig Start Date End Date Taking? Authorizing Provider  acetaminophen (TYLENOL) 500 MG tablet Take 1,000 mg by mouth 3 (three) times daily.    Yes [provider]  albuterol (PROVENTIL HFA;VENTOLIN HFA) 108 (90 Base) MCG/ACT inhaler Inhale 2 puffs into the lungs every 6 (six) hours as needed for wheezing or shortness of breath. 09/26/16  Yes Juanito Doom, MD  ALPRAZolam Duanne Moron) 0.25 MG tablet Take 1 tablet (0.25 mg total) by mouth 2 (two) times daily as needed for anxiety. Patient taking differently: Take 0.125 mg by mouth 2 (two) times daily as needed for anxiety.  12/22/16  Yes Henson, Vickie L, NP-C  aspirin 81 MG tablet Take 81 mg by mouth 2 (two) times daily.    Yes [provider]  Carboxymethylcellul-Glycerin (OPTIVE) 0.5-0.9 % SOLN Place 1 drop into both eyes daily. --dry eyes   Yes [provider]  carvedilol (COREG) 12.5 MG tablet Take 2 tablets (25 mg total) by mouth 2 (two) times daily. 12/19/16  Yes Lelon Perla, MD  doxycycline (VIBRA-TABS) 100 MG tablet Take 1 tablet (100 mg total) by mouth 2 (two) times daily. Patient taking differently: Take 100 mg by mouth See admin instructions. Only takes when she feels a cold coming on. When she takes this medication it is taken 1 tablet twice a day. 09/26/16  Yes Juanito Doom, MD  folic acid (FOLVITE) 979 MCG tablet Take 400 mcg by mouth  daily.   Yes [provider]  gabapentin (NEURONTIN) 100 MG capsule Take 1 capsule (100 mg total) by mouth 2 (two) times daily. Patient taking differently: Take 200 mg by mouth 2 (two) times daily.  12/29/16  Yes Henson, Vickie L, NP-C  gabapentin (NEURONTIN) 300 MG capsule Take 1 capsule (300 mg total) by mouth daily. Take at bedtime Patient taking differently: Take 300 mg by mouth at bedtime.  12/29/16  Yes Henson, Vickie L, NP-C  KRILL OIL PO Take 500 mg by mouth daily.  Yes [provider]  Lecithin 1200 MG CAPS Take 1,200 mg by mouth every morning.    Yes [provider]  losartan (COZAAR) 50 MG tablet TAKE 1 TABLET(50 MG) BY MOUTH TWICE DAILY 10/25/16  Yes Crenshaw, Denice Bors, MD  Magnesium 250 MG TABS Take 250 mg by mouth daily.    Yes [provider]  multivitamin-lutein (OCUVITE-LUTEIN) CAPS capsule Take 1 capsule by mouth daily.   Yes [provider]  tiotropium (SPIRIVA) 18 MCG inhalation capsule Place 1 capsule (18 mcg total) into inhaler and inhale daily. 09/26/16  Yes Juanito Doom, MD  vitamin B-12 (CYANOCOBALAMIN) 500 MCG tablet Take 500 mcg by mouth every morning.    Yes [provider]  hydrochlorothiazide (MICROZIDE) 12.5 MG capsule Take 1 capsule (12.5 mg total) by mouth daily. Patient not taking: Reported on 12/22/2016 11/17/16 02/15/17  Lelon Perla, MD    Physical Exam: Vitals:   01/12/17 1100 01/12/17 1300 01/12/17 1402 01/12/17 1452  BP: (!) 158/75 (!) 154/76 (!) 152/84   Pulse: 75 73 73   Resp: 19 18 18    Temp:      TempSrc:      SpO2: 98% 96% 97%   Weight:    80.7 kg (178 lb)  Height:    5\' 4"  (1.626 m)      Constitutional: NAD, calm, comfortable Vitals:   01/12/17 1100 01/12/17 1300 01/12/17 1402 01/12/17 1452  BP: (!) 158/75 (!) 154/76 (!) 152/84   Pulse: 75 73 73   Resp: 19 18 18    Temp:      TempSrc:      SpO2: 98% 96% 97%   Weight:    80.7 kg (178 lb)  Height:    5\' 4"  (1.626 m)    Eyes: PERRL, lids and conjunctivae normal ENMT: Mucous membranes are moist. Posterior pharynx clear of any exudate or lesions.Normal dentition.  Neck: normal, supple, no masses, no thyromegaly Respiratory: clear to auscultation bilaterally, no wheezing, no crackles. Normal respiratory effort. No accessory muscle use.  Cardiovascular: Regular rate and rhythm, no murmurs / rubs / gallops. No extremity edema. 2+ pedal pulses. No carotid bruits.  Abdomen: no tenderness, no masses palpated. No hepatosplenomegaly. Bowel sounds positive.  Musculoskeletal: no clubbing / cyanosis. No joint deformity upper and lower extremities. Good ROM, no contractures. Normal muscle tone.  Skin: no rashes, lesions, ulcers. No induration Neurologic: CN 2-12 grossly intact. Sensation intact, DTR normal. Strength 5/5 in all 4.  Psychiatric: Normal judgment and insight. Alert and oriented x 3. Normal mood.    Labs on Admission: I have personally reviewed following labs and imaging studies  CBC: Recent Labs  Lab 01/12/17 0730  WBC 8.9  HGB 12.3  HCT 37.7  MCV 96.4  PLT 680   Basic Metabolic Panel: Recent Labs  Lab 01/12/17 0730  NA 142  K 4.2  CL 106  CO2 28  GLUCOSE 120*  BUN 15  CREATININE 0.90  CALCIUM 9.7   GFR: Estimated Creatinine Clearance: 52.1 mL/min (by C-G formula based on SCr of 0.9 mg/dL). Liver Function Tests: No results for input(s): AST, ALT, ALKPHOS, BILITOT, PROT, ALBUMIN in the last 168 hours. No results for input(s): LIPASE, AMYLASE in the last 168 hours. No results for input(s): AMMONIA in the last 168 hours. Coagulation Profile: No results for input(s): INR, PROTIME in the last 168 hours. Cardiac Enzymes: No results for input(s): CKTOTAL, CKMB, CKMBINDEX, TROPONINI in the last 168 hours. BNP (last 3 results) No  results for input(s): PROBNP in the last 8760 hours. HbA1C: No results for input(s): HGBA1C in the last 72 hours. CBG: No results for input(s): GLUCAP in the  last 168 hours. Lipid Profile: No results for input(s): CHOL, HDL, LDLCALC, TRIG, CHOLHDL, LDLDIRECT in the last 72 hours. Thyroid Function Tests: No results for input(s): TSH, T4TOTAL, FREET4, T3FREE, THYROIDAB in the last 72 hours. Anemia Panel: No results for input(s): VITAMINB12, FOLATE, FERRITIN, TIBC, IRON, RETICCTPCT in the last 72 hours. Urine analysis:    Component Value Date/Time   BILIRUBINUR neg 01/26/2014 0939   PROTEINUR neg 01/26/2014 0939   UROBILINOGEN 0.2 01/26/2014 0939   NITRITE neg 01/26/2014 0939   LEUKOCYTESUR Trace 01/26/2014 0939   Sepsis Labs: !!!!!!!!!!!!!!!!!!!!!!!!!!!!!!!!!!!!!!!!!!!! @LABRCNTIP (procalcitonin:4,lacticidven:4) )No results found for this or any previous visit (from the past 240 hour(s)).   Radiological Exams on Admission: Dg Chest Port 1 View  Result Date: 01/12/2017 CLINICAL DATA:  Preop.  Femur fracture. EXAM: PORTABLE CHEST 1 VIEW COMPARISON:  09/26/2016 FINDINGS: The cardiac silhouette is upper limits of normal in size, unchanged. Aortic atherosclerosis is noted. The lungs are slightly hyperinflated with slight interstitial coarsening consistent with history of COPD. No confluent airspace opacity, edema, pleural effusion, or pneumothorax is identified. IMPRESSION: No active disease. Electronically Signed   By: Logan Bores M.D.   On: 01/12/2017 10:27   Dg Hip Unilat W Or Wo Pelvis 2-3 Views Left  Result Date: 01/12/2017 CLINICAL DATA:  Slipped to the for while getting into a wheelchair this morning. The patient is complaining of left femur pain. Patient has known metastatic disease to the right hip. EXAM: DG HIP (WITH OR WITHOUT PELVIS) 2-3V LEFT COMPARISON:  Left femur series of today's date FINDINGS: The bones are subjectively osteopenic. The bony pelvis appears intact. AP and lateral views of the left hip reveal narrowing of the joint space. The acetabulum and femoral head appear intact. The femoral neck, intertrochanteric, and immediate  sub trochanteric regions are normal. IMPRESSION: There is mild osteoarthritic joint space loss of the left hip. No acute fracture or lytic or blastic lesion of the left hip is observed. Electronically Signed   By: Shahzaib Azevedo  [place] M.D.   On: 01/12/2017 10:03   Dg Femur Min 2 Views Left  Result Date: 01/12/2017 CLINICAL DATA:  The patient slipped to the floor while getting into this wheelchair this morning. The patient is complaining of left hip pain and has obvious deformity. EXAM: LEFT FEMUR 2 VIEWS COMPARISON:  Left hip series of today's date. FINDINGS: There is an acute displaced fracture of the distal radial metadiaphysis. There is angulation with the apex anterior. The fracture line does not reach the femoral condyles. More proximally the femoral shaft is intact. The femoral head, neck, and intertrochanteric regions or included on the left hip series and wrist reveal no acute abnormalities. IMPRESSION: There is an acute angulated displaced fracture of the distal left femoral metadiaphysis. Electronically Signed   By: Brenee Gajda  [place] M.D.   On: 01/12/2017 10:04    EKG:  Pending Old chart reviewed Case discussed with edp   Assessment/Plan 81 yo female with mechanical fall with likely pathological left femur fracture Principal Problem:   Femur fracture ([place])- npo until OR with ortho team.  Postop anticoagulantion per ortho team.  Prn morphine ordered  Active Problems:   Essential hypertension- noted, stable.   COPD (chronic obstructive pulmonary disease) with emphysema ([place])- stable, cont home neds postop   Depression and anxiety- stable   Generalized anxiety disorder-  stable   CKD (chronic kidney disease) stage 3, GFR 30-59 ml/min (HCC)- stable and at baseline   Will need rehab postop   DVT prophylaxis:  scds Code Status:  DNR , MOSE paperwork with her at bedside Family Communication:  none Disposition Plan:  Per day team Consults called: orthopedic surgery Admission status:   admission   Carely Nappier A MD [facility]  If 7PM-7AM, please contact night-coverage [email or web address] Password [facility]  01/12/2017, 2:56 PM

## 2017-01-12 NOTE — ED Provider Notes (Addendum)
[facility] DEPT Provider Note   CSN: [id number] Arrival date & time: 01/12/17  2876     History   Chief Complaint Chief Complaint  Patient presents with  . Fall    HPI Melissa Ferrell is a 81 y.o. female.  HPI Patient is a 81 year old female with a known history of metastatic cancer thought to be primary lung with bony mets.  She was seen and evaluated recently for the sacral tumor with significant destruction of bone.  She underwent bone scan which demonstrated uptake in her right humerus as well as her left femur.  She states she slid out of bed this morning and slipped and presents with severe pain in her left thigh with obvious deformity and suspected femur fracture.  She was given fentanyl in route.  She denies head injury.  Denies neck pain.  Denies new back pain.  Denies pain in her arms.  No chest pain or abdominal pain.  The majority the pain is located in the left thigh that is worse with movement and palpation of her left lower extremity.  Normal pulses in her left foot.    Past Medical History:  Diagnosis Date  . Anxiety and depression   . Carotid artery disease (HCC)    hx CAD, dCHF listed in [place] - sees cardiologist, vasc and neurologist  . CKD (chronic kidney disease) stage 3, GFR 30-59 ml/min ([place]) 08/24/2016  . COPD (chronic obstructive pulmonary disease) [facility])    sees pulmonology  . DDD (degenerative disc disease)   . Disseminated superficial actinic porokeratosis    bilateral calves  . Fracture, spinal   . GERD (gastroesophageal reflux disease)    hx PUD 2ndary to NSAIDs 1975  . Hemorrhoids   . HTN (hypertension)   . Hx of adenomatous colonic polyps   . Hyperlipidemia    intol to statins  . IBS (irritable bowel syndrome)   . Memory loss    seen by neurology in 2015 and referred to neuropsych for anxiety  . Migraine headache   . Osteopenia determined by x-ray   . RHEUMATIC FEVER, HX OF 10/15/2006   Qualifier: Diagnosis of   By: Jenny Reichmann MD, Hunt Oris   . Rheumatoid arthritis(714.0)    per her report, in remission for many years per her report  . Varicose veins    hx LE edema    Patient Active Problem List   Diagnosis Date Noted  . CKD (chronic kidney disease) stage 3, GFR 30-59 ml/min (HCC) 08/24/2016  . Osteopenia determined by x-ray   . Generalized anxiety disorder 07/27/2015  . Depression and anxiety 12/02/2014  . Shortness of breath 07/01/2014  . PVD (peripheral vascular disease) ([place]) 05/02/2014  . Lower extremity edema 04/30/2014  . Varicose veins of lower extremities with other complications 81/15/7262  . Cerebrovascular disease 08/20/2010  . Hyperlipemia 10/15/2006  . MIGRAINE HEADACHE 10/15/2006  . Essential hypertension 10/15/2006  . COPD (chronic obstructive pulmonary disease) with emphysema ([place]) 10/15/2006    Past Surgical History:  Procedure Laterality Date  . chronically infected lymph node removed  1973  . THYROGLOSSAL DUCT CYST     1943, 1944, 1973    OB History    No data available       Home Medications    Prior to Admission medications   Medication Sig Start Date End Date Taking? Authorizing Provider  acetaminophen (TYLENOL) 500 MG tablet Take 1,000 mg by mouth every 6 (six) hours as needed for moderate pain.  [provider]  albuterol (PROVENTIL HFA;VENTOLIN HFA) 108 (90 Base) MCG/ACT inhaler Inhale 2 puffs into the lungs every 6 (six) hours as needed for wheezing or shortness of breath. 09/26/16   Juanito Doom, MD  ALPRAZolam Duanne Moron) 0.25 MG tablet Take 1 tablet (0.25 mg total) by mouth 2 (two) times daily as needed for anxiety. 12/22/16   Henson, Vickie L, NP-C  aspirin 81 MG tablet Take 81 mg by mouth 2 (two) times daily.     [provider]  Carboxymethylcellul-Glycerin (OPTIVE) 0.5-0.9 % SOLN Apply 1 drop to eye 3 (three) times daily as needed. --dry eyes    [provider]  carvedilol (COREG) 12.5 MG tablet Take 2 tablets (25 mg total)  by mouth 2 (two) times daily. 12/19/16   Lelon Perla, MD  doxycycline (VIBRA-TABS) 100 MG tablet Take 1 tablet (100 mg total) by mouth 2 (two) times daily. Patient not taking: Reported on 12/22/2016 09/26/16   Juanito Doom, MD  Flaxseed, Linseed, (FLAXSEED OIL) 1000 MG CAPS Take 1 capsule by mouth daily at 12 noon.     [provider]  folic acid (FOLVITE) 725 MCG tablet Take 400 mcg by mouth daily.    [provider]  gabapentin (NEURONTIN) 100 MG capsule Take 1 capsule (100 mg total) by mouth 2 (two) times daily. 12/29/16   Henson, Vickie L, NP-C  gabapentin (NEURONTIN) 300 MG capsule Take 1 capsule (300 mg total) by mouth daily. Take at bedtime 12/29/16   Henson, Vickie L, NP-C  hydrochlorothiazide (MICROZIDE) 12.5 MG capsule Take 1 capsule (12.5 mg total) by mouth daily. Patient not taking: Reported on 12/22/2016 11/17/16 02/15/17  Lelon Perla, MD  KRILL OIL PO Take 500 mg by mouth daily.     [provider]  Lecithin 1200 MG CAPS Take 1,200 mg by mouth every morning.     [provider]  losartan (COZAAR) 50 MG tablet TAKE 1 TABLET(50 MG) BY MOUTH TWICE DAILY 10/25/16   Lelon Perla, MD  Magnesium 250 MG TABS Take 250 mg by mouth daily.     [provider]  Multiple Vitamins-Minerals (CENTRUM SILVER PO) Take 1 tablet by mouth every morning.     [provider]  multivitamin-lutein (OCUVITE-LUTEIN) CAPS capsule Take 1 capsule by mouth daily.    [provider]  tiotropium (SPIRIVA) 18 MCG inhalation capsule Place 1 capsule (18 mcg total) into inhaler and inhale daily. 09/26/16   Juanito Doom, MD  vitamin B-12 (CYANOCOBALAMIN) 500 MCG tablet Take 500 mcg by mouth every morning.     [provider]    Family History Family History  Problem Relation Age of Onset  . Heart disease Mother        MI  . Hypertension Mother   . Heart attack Mother   . Brain cancer Father   . Cancer Father   . Breast  cancer Maternal Grandmother   . Stomach cancer Brother   . Cancer Brother   . Heart disease Brother   . Hypertension Brother   . Heart attack Brother   . AAA (abdominal aortic aneurysm) Brother   . Aneurysm Brother        brain  . Melanoma Brother   . Colon cancer Brother   . Heart disease Brother   . Alcohol abuse Unknown   . Heart attack Brother 4  . Allergies Sister   . Hypertension Sister     Social History Social History  Tobacco Use  . Smoking status: Former Smoker    Packs/day: 3.00    Years: 30.00    Pack years: 90.00    Types: Cigarettes    Last attempt to quit: 01/17/1985    Years since quitting: 32.0  . Smokeless tobacco: Never Used  . Tobacco comment: previous 80 pack year history  Substance Use Topics  . Alcohol use: No  . Drug use: No     Allergies   Atrovent [ipratropium]; Bee venom; Qvar [beclomethasone]; Stiolto respimat [tiotropium bromide-olodaterol]; Anoro ellipta [umeclidinium-vilanterol]; Prednisone; Benzocaine; Breo ellipta [fluticasone furoate-vilanterol]; Bystolic [nebivolol hcl]; Cefdinir; Ciprofloxacin; Diphenhydramine hcl; Erythromycin; Indomethacin; Lasix [furosemide]; Other; Statins; Tobramycin; Zetia [ezetimibe]; Azithromycin; Paba derivatives; and Penicillins   Review of Systems Review of Systems  All other systems reviewed and are negative.    Physical Exam Updated Vital Signs BP (!) 166/66   Pulse 67   Temp 98.1 F (36.7 C) (Oral)   Resp 18   SpO2 100%   Physical Exam  Constitutional: She is oriented to person, place, and time. She appears well-developed and well-nourished. No distress.  HENT:  Head: Normocephalic and atraumatic.  Eyes: EOM are normal.  Neck: Normal range of motion.  Cardiovascular: Normal rate, regular rhythm and normal heart sounds.  Pulmonary/Chest: Effort normal and breath sounds normal.  Abdominal: Soft. She exhibits no distension. There is no tenderness.  Musculoskeletal:  Flexion at the left  hip with pain with straightening of the left leg.  Obvious deformity around the left thigh.  Normal pulses left foot.  Neurological: She is alert and oriented to person, place, and time.  Skin: Skin is warm and dry.  Psychiatric: She has a normal mood and affect. Judgment normal.  Nursing note and vitals reviewed.    ED Treatments / Results  Labs (all labs ordered are listed, but only abnormal results are displayed) Labs Reviewed  BASIC METABOLIC PANEL - Abnormal; Notable for the following components:      Result Value   Glucose, Bld 120 (*)    GFR calc non Af Amer 59 (*)    All other components within normal limits  CBC     EKG  EKG Interpretation None       Radiology No results found.  Procedures Procedures (including critical care time)  SPLINT APPLICATION Authorized by: Jola Schmidt Consent: Verbal consent obtained. Risks and benefits: risks, benefits and alternatives were discussed Consent given by: patient Splint applied by: orthopedic technician Location details: left lower extremity Splint type: knee immobolizer Supplies used: knee immobolizer Post-procedure: The splinted body part was neurovascularly unchanged following the procedure. Patient tolerance: Patient tolerated the procedure well with no immediate complications.    Medications Ordered in ED Medications  morphine 4 MG/ML injection 4 mg (4 mg Intravenous Given 01/12/17 0958)  morphine 4 MG/ML injection 6 mg (6 mg Intravenous Given 01/12/17 0753)     Initial Impression / Assessment and Plan / ED Course  I have reviewed the triage vital signs and the nursing notes.  Pertinent labs & imaging results that were available during my care of the patient were reviewed by me and considered in my medical decision making (see chart for details).     Suspected pathologic left femur fracture.  Traction applied to left lower extremity and leg straightened with improvement in pain.  IV pain medicine now.   N.p.o.  10:02 AM Distal left femur fracture.  Patient with known sacral mass with osseous destruction and uptake in her right humerus and  distal left femur.  This is likely pathologic fracture left femur.  She is involved with hospice and palliative care of [facility].  Will discuss case with orthopedic surgery is for possible operative management for mobility and palliation   Final Clinical Impressions(s) / ED Diagnoses   Final diagnoses:  Pathological fracture in neoplastic disease, left femur, initial encounter for fracture [facility])    ED Discharge Orders    None       Jola Schmidt, MD 01/12/17 [place], Broxton Broady, MD 01/12/17 1010

## 2017-01-12 NOTE — ED Notes (Signed)
Pt is at radiology dept

## 2017-01-12 NOTE — Progress Notes (Signed)
[facility] Liaison: RN visit  Patient experienced a fall at her home at [facility]. Patient did not notify [facility] prior to coming to the ED.    Visited patient at bedside in ED. She is alert and oriented. She has a fracture of the distal left femoral metadiaphysis.  She has a leg/knee immobilizer on and is receiving O2 @ 2L [place]. Rates pain at 10/0-10 scale with spasms. She has received Morphine for pain in ED.   Hospice will continue to follow and anticipate needs. Please call with any Hospice related questions.  Thank you,  [facility] Liaison [phone]   All hospital liaisons are on [place].

## 2017-01-12 NOTE — ED Triage Notes (Addendum)
Pt presents from [facility] via EMS with c/o fall. Per EMS, she slid out of her bed onto her left leg. Pt has an obvious deformity to the femur and the knee per EMS. Pt is a hospice patient. Pt has bone cancer and has a DNR in place. Pt was given 50 mcg of fentanyl en route by EMS. Pulses present on left foot. Pt placed on 2L of O2 by EMS after fentanyl given as her O2 sats dropped.

## 2017-01-12 NOTE — Consult Note (Signed)
Reason for Consult:Left femur fx Referring Physician: K Newell Ferrell is an 81 y.o. female.  HPI: Melissa Ferrell was transferring into her WC when she slipped and fell. She had immediate left knee pain and could not get up. She was brought to the ED where x-rays showed a distal left femur fx, likely pathologic. She has a diagnosis of bony metastases likely 2/2 a lung primary. She sees oncology at [facility]. Aside from the knee pain she has no other new c/o.  Past Medical History:  Diagnosis Date  . Anxiety and depression   . Carotid artery disease (HCC)    hx CAD, dCHF listed in [place] - sees cardiologist, vasc and neurologist  . CKD (chronic kidney disease) stage 3, GFR 30-59 ml/min ([place]) 08/24/2016  . COPD (chronic obstructive pulmonary disease) [facility])    sees pulmonology  . DDD (degenerative disc disease)   . Disseminated superficial actinic porokeratosis    bilateral calves  . Fracture, spinal   . GERD (gastroesophageal reflux disease)    hx PUD 2ndary to NSAIDs 1975  . Hemorrhoids   . HTN (hypertension)   . Hx of adenomatous colonic polyps   . Hyperlipidemia    intol to statins  . IBS (irritable bowel syndrome)   . Memory loss    seen by neurology in 2015 and referred to neuropsych for anxiety  . Migraine headache   . Osteopenia determined by x-ray   . RHEUMATIC FEVER, HX OF 10/15/2006   Qualifier: Diagnosis of  By: Melissa Reichmann MD, Melissa Ferrell   . Rheumatoid arthritis(714.0)    per her report, in remission for many years per her report  . Varicose veins    hx LE edema    Past Surgical History:  Procedure Laterality Date  . chronically infected lymph node removed  1973  . THYROGLOSSAL DUCT CYST     1943, 1944, 62    Family History  Problem Relation Age of Onset  . Heart disease Mother        MI  . Hypertension Mother   . Heart attack Mother   . Brain cancer Father   . Cancer Father   . Breast cancer Maternal Grandmother   . Stomach cancer Brother   . Cancer Brother   .  Heart disease Brother   . Hypertension Brother   . Heart attack Brother   . AAA (abdominal aortic aneurysm) Brother   . Aneurysm Brother        brain  . Melanoma Brother   . Colon cancer Brother   . Heart disease Brother   . Alcohol abuse Unknown   . Heart attack Brother 76  . Allergies Sister   . Hypertension Sister     Social History:  reports that she quit smoking about 32 years ago. Her smoking use included cigarettes. She has a 90.00 pack-year smoking history. she has never used smokeless tobacco. She reports that she does not drink alcohol or use drugs.  Allergies:  Allergies  Allergen Reactions  . Atrovent [Ipratropium] Anaphylaxis  . Bee Venom Anaphylaxis  . Qvar [Beclomethasone] Anaphylaxis  . Stiolto Respimat [Tiotropium Bromide-Olodaterol] Anaphylaxis  . Anoro Ellipta [Umeclidinium-Vilanterol]     HEADACHES  . Prednisone Other (See Comments)    Per patient: was told that she was not to take it by her early Rheumatology physician  . Benzocaine Itching and Other (See Comments)    Blisters  . Breo Ellipta [Fluticasone Furoate-Vilanterol]     Headache, anxiety.   Marland Kitchen  Bystolic [Nebivolol Hcl]   . Cefdinir Other (See Comments)    unknown  . Ciprofloxacin Other (See Comments)    unknown  . Diphenhydramine Hcl Other (See Comments)    "benadryl"--pain   . Erythromycin Other (See Comments)    Headache   . Indomethacin Other (See Comments)    Headache   . Lasix [Furosemide] Other (See Comments)    Low tolerance - swelling    . Other     Adhesive tape--blisters  . Statins     Low tolerance    . Tobramycin Itching  . Zetia [Ezetimibe]     Low tolerance   . Azithromycin Rash  . Paba Derivatives Rash  . Penicillins Itching and Rash    Has patient had a PCN reaction causing immediate rash, facial/tongue/throat swelling, SOB or lightheadedness with hypotension: No Has patient had a PCN reaction causing severe rash involving mucus membranes or skin necrosis:  No Has patient had a PCN reaction that required hospitalization: No Has patient had a PCN reaction occurring within the last 10 years: No If all of the above answers are "NO", then may proceed with Cephalosporin use.     Medications: I have reviewed the patient's current medications.  Results for orders placed or performed during the hospital encounter of 01/12/17 (from the past 48 hour(s))  CBC     Status: None   Collection Time: 01/12/17  7:30 AM  Result Value Ref Range   WBC 8.9 4.0 - 10.5 Melissa/uL   RBC 3.91 3.87 - 5.11 MIL/uL   Hemoglobin 12.3 12.0 - 15.0 g/dL   HCT 37.7 36.0 - 46.0 %   MCV 96.4 78.0 - 100.0 fL   MCH 31.5 26.0 - 34.0 pg   MCHC 32.6 30.0 - 36.0 g/dL   RDW 14.1 11.5 - 15.5 %   Platelets 253 150 - 400 Melissa/uL  Basic metabolic panel     Status: Abnormal   Collection Time: 01/12/17  7:30 AM  Result Value Ref Range   Sodium 142 135 - 145 mmol/L   Potassium 4.2 3.5 - 5.1 mmol/L   Chloride 106 101 - 111 mmol/L   CO2 28 22 - 32 mmol/L   Glucose, Bld 120 (H) 65 - 99 mg/dL   BUN 15 6 - 20 mg/dL   Creatinine, Ser 0.90 0.44 - 1.00 mg/dL   Calcium 9.7 8.9 - 10.3 mg/dL   GFR calc non Af Amer 59 (L) >60 mL/min   GFR calc Af Amer >60 >60 mL/min    Comment: (NOTE) The eGFR has been calculated using the CKD EPI equation. This calculation has not been validated in all clinical situations. eGFR's persistently <60 mL/min signify possible Chronic Kidney Disease.    Anion gap 8 5 - 15    Dg Chest Port 1 View  Result Date: 01/12/2017 CLINICAL DATA:  Preop.  Femur fracture. EXAM: PORTABLE CHEST 1 VIEW COMPARISON:  09/26/2016 FINDINGS: The cardiac silhouette is upper limits of normal in size, unchanged. Aortic atherosclerosis is noted. The lungs are slightly hyperinflated with slight interstitial coarsening consistent with history of COPD. No confluent airspace opacity, edema, pleural effusion, or pneumothorax is identified. IMPRESSION: No active disease. Electronically Signed    By: Melissa Ferrell M.D.   On: 01/12/2017 10:27   Dg Hip Unilat W Or Wo Pelvis 2-3 Views Left  Result Date: 01/12/2017 CLINICAL DATA:  Slipped to the for while getting into a wheelchair this morning. The patient is complaining of left femur pain. Patient  has known metastatic disease to the right hip. EXAM: DG HIP (WITH OR WITHOUT PELVIS) 2-3V LEFT COMPARISON:  Left femur series of today's date FINDINGS: The bones are subjectively osteopenic. The bony pelvis appears intact. AP and lateral views of the left hip reveal narrowing of the joint space. The acetabulum and femoral head appear intact. The femoral neck, intertrochanteric, and immediate sub trochanteric regions are normal. IMPRESSION: There is mild osteoarthritic joint space loss of the left hip. No acute fracture or lytic or blastic lesion of the left hip is observed. Electronically Signed   By: David  Martinique M.D.   On: 01/12/2017 10:03   Dg Femur Min 2 Views Left  Result Date: 01/12/2017 CLINICAL DATA:  The patient slipped to the floor while getting into this wheelchair this morning. The patient is complaining of left hip pain and has obvious deformity. EXAM: LEFT FEMUR 2 VIEWS COMPARISON:  Left hip series of today's date. FINDINGS: There is an acute displaced fracture of the distal radial metadiaphysis. There is angulation with the apex anterior. The fracture line does not reach the femoral condyles. More proximally the femoral shaft is intact. The femoral head, neck, and intertrochanteric regions or included on the left hip series and wrist reveal no acute abnormalities. IMPRESSION: There is an acute angulated displaced fracture of the distal left femoral metadiaphysis. Electronically Signed   By: David  Martinique M.D.   On: 01/12/2017 10:04    Review of Systems  Constitutional: Negative for weight loss.  HENT: Negative for ear discharge, ear pain, hearing loss and tinnitus.   Eyes: Negative for blurred vision, double vision, photophobia and pain.   Respiratory: Negative for cough, sputum production and shortness of breath.   Cardiovascular: Negative for chest pain.  Gastrointestinal: Negative for abdominal pain, nausea and vomiting.  Genitourinary: Negative for dysuria, flank pain, frequency and urgency.  Musculoskeletal: Positive for joint pain (Left thigh/knee). Negative for back pain, falls, myalgias and neck pain.  Neurological: Negative for dizziness, tingling, sensory change, focal weakness, loss of consciousness and headaches.  Endo/Heme/Allergies: Does not bruise/bleed easily.  Psychiatric/Behavioral: Negative for depression, memory loss and substance abuse. The patient is not nervous/anxious.    Blood pressure (!) 154/76, pulse 73, temperature 98.1 F (36.7 C), temperature source Oral, resp. rate 18, SpO2 96 %. Physical Exam  Constitutional: She appears well-developed and well-nourished. No distress.  HENT:  Head: Normocephalic.  Eyes: Conjunctivae are normal. Right eye exhibits no discharge. Left eye exhibits no discharge. No scleral icterus.  Neck: Normal range of motion.  Cardiovascular: Normal rate and regular rhythm.  Respiratory: Effort normal. No respiratory distress.  Musculoskeletal:  LLE No traumatic wounds, ecchymosis, or rash  Severe TTP knee, in KI  Sens DPN, SPN, TN intact  Motor EHL, ext, flex, evers 5/5  DP 2+, PT 1+, No significant edema  Neurological: She is alert.  Skin: Skin is warm and dry. She is not diaphoretic.  Psychiatric: She has a normal mood and affect. Her behavior is normal.    Assessment/Plan: Fall Left distal femur fx -- For IMN this afternoon by Dr. Doran Durand. Please keep NPO until then. Multiple medical problems -- Will ask IM to admit given history    Lisette Abu, PA-C Orthopedic Surgery [phone] 01/12/2017, 1:21 PM   Pt seen and examined.  Agree with note above.  81 y/o female with multiple bone mets fell this morning sustaining a pathologic femur fracture.  She  presents now for IM nailing.  The risks and benefits  of the alternative treatment options have been discussed in detail.  The patient wishes to proceed with surgery and specifically understands risks of bleeding, infection, nerve damage, blood clots, need for additional surgery, amputation and death.

## 2017-01-12 NOTE — ED Notes (Signed)
Bed: WA21 Expected date:  Expected time:  Means of arrival:  Comments: EMS fall dislocated knee 81 yo female fentanyl IV

## 2017-01-12 NOTE — ED Notes (Signed)
Called [facility] to inform them of pt being here.

## 2017-01-12 NOTE — Anesthesia Preprocedure Evaluation (Addendum)
Anesthesia Evaluation  Patient identified by MRN, date of birth, ID band Patient awake    Reviewed: Allergy & Precautions, NPO status , Patient's Chart, lab work & pertinent test results  Airway Mallampati: II  TM Distance: >3 FB Neck ROM: Full    Dental no notable dental hx.    Pulmonary COPD, former smoker,  Metastatic lung CA with bone mets. Hospice   Pulmonary exam normal breath sounds clear to auscultation       Cardiovascular hypertension, Pt. on medications + CAD and + Peripheral Vascular Disease  Normal cardiovascular exam Rhythm:Regular Rate:Normal     Neuro/Psych Anxiety Memory loss negative neurological ROS  negative psych ROS   GI/Hepatic negative GI ROS, Neg liver ROS,   Endo/Other  negative endocrine ROS  Renal/GU CRFRenal disease  negative genitourinary   Musculoskeletal negative musculoskeletal ROS (+)   Abdominal   Peds negative pediatric ROS (+)  Hematology negative hematology ROS (+)   Anesthesia Other Findings   Reproductive/Obstetrics negative OB ROS                            Anesthesia Physical Anesthesia Plan  ASA: III  Anesthesia Plan: Spinal   Post-op Pain Management:    Induction: Intravenous  PONV Risk Score and Plan: 2 and Ondansetron and Treatment may vary due to age or medical condition  Airway Management Planned: Simple Face Mask  Additional Equipment:   Intra-op Plan:   Post-operative Plan:   Informed Consent: I have reviewed the patients History and Physical, chart, labs and discussed the procedure including the risks, benefits and alternatives for the proposed anesthesia with the patient or authorized representative who has indicated his/her understanding and acceptance.   Dental advisory given  Plan Discussed with: CRNA  Anesthesia Plan Comments: (Pt has terminal cancer, on Hospice. Poor surgical candidate. I fear she will not  extubate easily under GA. SAB is preferred despite her spine fracture issues.)        Anesthesia Quick Evaluation

## 2017-01-12 NOTE — Anesthesia Procedure Notes (Signed)
Date/Time: 01/12/2017 5:25 PM Performed by: Lissa Morales, CRNA Pre-anesthesia Checklist: Timeout performed, Patient being monitored, Suction available, Emergency Drugs available and Patient identified Oxygen Delivery Method: Simple face mask Placement Confirmation: positive ETCO2 Dental Injury: Teeth and Oropharynx as per pre-operative assessment

## 2017-01-12 NOTE — Anesthesia Postprocedure Evaluation (Signed)
Anesthesia Post Note  Patient: Melissa Ferrell  Procedure(s) Performed: INTRAMEDULLARY (IM) RETROGRADE FEMORAL NAILING (Left Hip)     Patient location during evaluation: PACU Anesthesia Type: Spinal Level of consciousness: awake and alert Pain management: pain level controlled Vital Signs Assessment: post-procedure vital signs reviewed and stable Respiratory status: spontaneous breathing, nonlabored ventilation, respiratory function stable and patient connected to nasal cannula oxygen Cardiovascular status: blood pressure returned to baseline and stable Postop Assessment: no apparent nausea or vomiting and spinal receding Anesthetic complications: no    Last Vitals:  Vitals:   01/12/17 1745 01/12/17 1824  BP: (!) 121/57 127/71  Pulse: 73 74  Resp: 14 17  Temp:  36.9 C  SpO2: 95% 96%    Last Pain:  Vitals:   01/12/17 1824  TempSrc: Oral  PainSc:                  Montez Hageman

## 2017-01-13 ENCOUNTER — Telehealth: Payer: Self-pay | Admitting: Family Medicine

## 2017-01-13 ENCOUNTER — Encounter (HOSPITAL_COMMUNITY): Payer: Self-pay | Admitting: Orthopedic Surgery

## 2017-01-13 DIAGNOSIS — C419 Malignant neoplasm of bone and articular cartilage, unspecified: Secondary | ICD-10-CM

## 2017-01-13 LAB — CBC
HCT: 30.8 % — ABNORMAL LOW (ref 36.0–46.0)
Hemoglobin: 10 g/dL — ABNORMAL LOW (ref 12.0–15.0)
MCH: 31.4 pg (ref 26.0–34.0)
MCHC: 32.5 g/dL (ref 30.0–36.0)
MCV: 96.9 fL (ref 78.0–100.0)
PLATELETS: 223 10*3/uL (ref 150–400)
RBC: 3.18 MIL/uL — ABNORMAL LOW (ref 3.87–5.11)
RDW: 14.2 % (ref 11.5–15.5)
WBC: 8.4 10*3/uL (ref 4.0–10.5)

## 2017-01-13 LAB — BASIC METABOLIC PANEL
Anion gap: 6 (ref 5–15)
BUN: 16 mg/dL (ref 6–20)
CALCIUM: 8.9 mg/dL (ref 8.9–10.3)
CO2: 28 mmol/L (ref 22–32)
CREATININE: 0.94 mg/dL (ref 0.44–1.00)
Chloride: 103 mmol/L (ref 101–111)
GFR calc Af Amer: >60 mL/min (ref >60)
GFR, EST NON AFRICAN AMERICAN: 56 mL/min — AB (ref >60)
GLUCOSE: 133 mg/dL — AB (ref 65–99)
POTASSIUM: 3.5 mmol/L (ref 3.5–5.1)
SODIUM: 137 mmol/L (ref 135–145)

## 2017-01-13 MED ORDER — ENOXAPARIN SODIUM 40 MG/0.4ML ~~LOC~~ SOLN: 40.0000 mg | SUBCUTANEOUS | 0 refills | Status: DC

## 2017-01-13 MED ORDER — OXYCODONE HCL 5 MG PO TABS: 5.0000 mg | ORAL_TABLET | ORAL | 0 refills | Status: DC | PRN

## 2017-01-13 MED ORDER — SODIUM CHLORIDE 0.9 % IV SOLN
INTRAVENOUS | Status: DC
  Administered 2017-01-13 – 2017-01-18 (×8): via INTRAVENOUS

## 2017-01-13 MED ORDER — TIOTROPIUM BROMIDE MONOHYDRATE 18 MCG IN CAPS
18.0000 ug | ORAL_CAPSULE | Freq: Every day | RESPIRATORY_TRACT | Status: DC
  Administered 2017-01-14 – 2017-01-19 (×5): 18 ug via RESPIRATORY_TRACT
  Filled 2017-01-13 (×2): qty 5

## 2017-01-13 NOTE — Telephone Encounter (Signed)
Hospice called and wanted to let you know that Melissa Ferrell was admitted to the hospital,

## 2017-01-13 NOTE — Progress Notes (Signed)
[facility] RN Visit.  This is a related and covered GIP admission of 01/12/2017 with [facility] diagnosis of lung cancer. Code status : Full code post surgery. Patient experienced a fall and fractured left femur. She activated EMS and was transported to [facility] ED for evaluation. Hospice was notified after she was transported.   Visited with patient at bedside. She is alert and oriented and denies pain this morning. She is post operative for placement of intramedullary nailing. She had family visiting and was eating breakfast.    Continuous Medications: NS @ 24ml/hr Antibiotics: none PRN medications:  None given today  Dr. Earlie Counts and Dr. Redmond School notified of admission. Transfer Summary and medication list placed on shadow chart.   Will continue to follow while hospitalized and anticipate discharge needs.   Thank you  Farrel Gordon, RN, [facility] Liaison  [place] are on AMION

## 2017-01-13 NOTE — Care Management Note (Signed)
Case Management Note  Patient Details  Name: Melissa Ferrell MRN: [id number] Date of Birth: 1935-11-19  Subjective/Objective:                  fall  Action/Plan: Date: January 13, 2017 Velva Harman, BSN, [place], [place] Chart and notes review for patient progress and needs. Will follow for case management and discharge needs. Next review date: [id number]  Expected Discharge Date:  (unknown)               Expected Discharge Plan:  Home w Hospice Care  In-House Referral:     Discharge planning Services     Post Acute Care Choice:    Choice offered to:     DME Arranged:    DME Agency:     HH Arranged:    [place] Agency:  [facility]  Status of Service:  In process, will continue to follow  If discussed at Long Length of Stay Meetings, dates discussed:    Additional Comments:  Leeroy Cha, RN 01/13/2017, 8:36 AM

## 2017-01-13 NOTE — Evaluation (Signed)
Physical Therapy Evaluation Patient Details Name: Melissa Ferrell MRN: [id number] DOB: 05-16-1935 Today's Date: 01/13/2017   History of Present Illness  Patient is a 81 year old female with a known history of metastatic cancer  with multiple sites forbony mets. Recently diagnosed with sacral tumor with significant destruction of bone. Lesions of  her right humerus as well as her left femur.  patient  slid out of bed 12/27 and sustain femr fracture. S/P IM nailing 01/12/17  Clinical Impression  The patient is requiring extensive assistance of 2-3 for mobility. Currently, the patient will require a mechanical lift for OOB  Activities. The patient was transfers only PTA. Pt admitted with above diagnosis. Pt currently with functional limitations due to the deficits listed below (see PT Problem List).  Pt will benefit from skilled PT to increase their independence and safety with mobility to allow discharge to the venue listed below.       Follow Up Recommendations SNF    Equipment Recommendations  None recommended by PT    Recommendations for Other Services       Precautions / Restrictions Precautions Precautions: Fall Precaution Comments: multiple boney mets to right forearem ,sacrum      Mobility  Bed Mobility Overal bed mobility: Needs Assistance Bed Mobility: Supine to Sit;Sit to Supine     Supine to sit: +2 for physical assistance;Total assist;+2 for safety/equipment;HOB elevated Sit to supine: Total assist;+2 for physical assistance;+2 for safety/equipment   General bed mobility comments: the patient really unable to assist with any mobility, sits with 2 assist for support of the left leg and trunk.  Transfers                 General transfer comment: will need a lift  Ambulation/Gait                Stairs            Wheelchair Mobility    Modified Rankin (Stroke Patients Only)       Balance Overall balance assessment: History of Falls;Needs  assistance Sitting-balance support: Feet supported;Single extremity supported Sitting balance-Leahy Scale: Zero   Postural control: Posterior lean                                   Pertinent Vitals/Pain Pain Assessment: 0-10 Pain Score: 8  Pain Location: left thigh Pain Descriptors / Indicators: Aching;Cramping;Discomfort;Grimacing;Guarding Pain Intervention(s): Premedicated before session;Repositioned;Ice applied    Home Living Family/patient expects to be discharged to:: Private residence Living Arrangements: Alone   Type of Home: Apartment           Additional Comments: resides at [employer] apartment    Prior Function Level of Independence: Needs assistance;Independent with assistive device(s)   Gait / Transfers Assistance Needed: transfers to [facility] independently           Hand Dominance        Extremity/Trunk Assessment   Upper Extremity Assessment Upper Extremity Assessment: RUE deficits/detail RUE Deficits / Details: decreased weight on the arm    Lower Extremity Assessment Lower Extremity Assessment: LLE deficits/detail;RLE deficits/detail RLE Sensation: history of peripheral neuropathy LLE Deficits / Details: unable to lift leg, does not tolerate the knee flexed.       Communication   Communication: No difficulties  Cognition Arousal/Alertness: Lethargic;Awake/alert;Suspect due to medications   Overall Cognitive Status: Impaired/Different from baseline Area of Impairment: Safety/judgement;Awareness  Safety/Judgement: Decreased awareness of deficits     General Comments: lethargic when not stimultaed, the patient states that she does not want to go to skilled but does not appear to grasp that she will need 2 assist and 24/7 caregivers.      General Comments      Exercises     Assessment/Plan    PT Assessment Patient needs continued PT services  PT Problem List Decreased strength;Decreased  range of motion;Decreased knowledge of use of DME;Decreased activity tolerance;Decreased safety awareness;Decreased balance;Decreased knowledge of precautions;Decreased mobility       PT Treatment Interventions Functional mobility training;Therapeutic activities;Patient/family education    PT Goals (Current goals can be found in the Care Plan section)  Acute Rehab PT Goals Patient Stated Goal: to go to my apartment PT Goal Formulation: With patient Time For Goal Achievement: 01/27/17 Potential to Achieve Goals: Fair    Frequency Min 2X/week   Barriers to discharge Decreased caregiver support      Co-evaluation               AM-PAC PT "6 Clicks" Daily Activity  Outcome Measure Difficulty turning over in bed (including adjusting bedclothes, sheets and blankets)?: Unable Difficulty moving from lying on back to sitting on the side of the bed? : Unable Difficulty sitting down on and standing up from a chair with arms (e.g., wheelchair, bedside commode, etc,.)?: Unable Help needed moving to and from a bed to chair (including a wheelchair)?: Total Help needed walking in hospital room?: Total Help needed climbing 3-5 steps with a railing? : Total 6 Click Score: 6    End of Session   Activity Tolerance: Patient limited by pain;Patient limited by fatigue Patient left: in bed;with call bell/phone within reach;with bed alarm set Nurse Communication: Mobility status;Need for lift equipment PT Visit Diagnosis: Difficulty in walking, not elsewhere classified (R26.2)    Time: 2025-4270 PT Time Calculation (min) (ACUTE ONLY): 35 min   Charges:   PT Evaluation $PT Eval High Complexity: 1 High PT Treatments $Therapeutic Activity: 8-22 mins   PT G CodesTresa Endo PT [phone]   Aayana, [place] 01/13/2017, 1:05 PM

## 2017-01-13 NOTE — Progress Notes (Signed)
Nutrition Brief Note  Patient identified on the Malnutrition Screening Tool (MST) Report  Wt Readings from Last 15 Encounters:  01/12/17 178 lb (80.7 kg)  12/22/16 178 lb (80.7 kg)  11/17/16 179 lb (81.2 kg)  10/16/16 192 lb (87.1 kg)  10/03/16 193 lb 12.8 oz (87.9 kg)  09/26/16 194 lb (88 kg)  08/22/16 198 lb 3.2 oz (89.9 kg)  05/26/16 194 lb (88 kg)  05/19/16 195 lb (88.5 kg)  03/30/16 195 lb (88.5 kg)  02/17/16 193 lb 3.2 oz (87.6 kg)  12/31/15 187 lb (84.8 kg)  11/30/15 191 lb 3.2 oz (86.7 kg)  08/03/15 193 lb (87.5 kg)  07/30/15 192 lb (87.1 kg)   Pt with PMH significant for CAD, HTN, COPD, CKD III, and unknown cancer type (does not wish to know, no active treatment, on hospice care). Presents this admission after a mechanical fall at independent living resulting in left distal femur fracture. Pt had open treatment of left femur with retrograde intramedullary nailing 12/27. Spoke with pt at bedside. Reports having great appetite prior to admission, consuming three meals per day. Admits to losing 20 lb over the last year. Discussed importance of protein consumption for post op healing and cancer diagnosis. Pt refused all supplements at this time. Does not wish to have any nutrition intervention.   Body mass index is 30.55 kg/m. Patient meets criteria for obese based on current BMI.   Current diet order is regular, patient is consuming approximately 100% of meals at this time. Labs and medications reviewed.   No nutrition interventions warranted at this time. If nutrition issues arise, please consult RD.   Mariana Single RD, LDN Clinical Nutrition Pager # [phone]

## 2017-01-13 NOTE — Progress Notes (Signed)
PROGRESS NOTE    Melissa Ferrell  ZOX:[id number] DOB: 1935-03-02 DOA: 01/12/2017 PCP: Girtha Rm, NP-C   Brief Narrative: Patient is a 81 year old female with past medical history of recently diagnosed malignancy of bone of unknown type, CAD, RA, hypertension, COPD, CKD who was brought to the emergency department after slipping off her chair and injuring her leg.  She was just placed on hospice supportive care for pain control.  Patient was found to have a left distal femoral fracture on presentation.  Underwent intervention by orthopedics.  Assessment & Plan:   Principal Problem:   Femur fracture ([place]) Active Problems:   Essential hypertension   COPD (chronic obstructive pulmonary disease) with emphysema (HCC)   Depression and anxiety   Generalized anxiety disorder   CKD (chronic kidney disease) stage 3, GFR 30-59 ml/min (HCC)   Neoplasm of bone, malignant (HCC)   Left Femur fracture: Likely pathological fracture with metastatic bone tumor.  Status post intramedullary retrograde femoral nailing on the left. Physical therapy following.  Weightbearing as tolerated on left lower extremity. Pain control as necessary. Will be discharged on Lovenox for DVT prophylaxis She will follow-up as an outpatient with Dr Doran Durand, Orthopedics   Malignant neoplasm of bone: MRI from 11/30/16. Significant findings included large homogeneous mass with cortical destruction of the right sacrum. Xray of pelvis from 11/28/16 show abnormal lucency of the right sacrum With cortical destruction of the distal right sacrum  She was found to have Large homogeneous mass with cortical destruction of the right sacrum as above. As per orthopedics oncology:Likely Metastatic disease vs Lymphoma vs Multiple myeloma. Also chordoma is on the differential diagnosis with the position of the tumor. She is also being followed by hospice here. Follows as an outpatient with Dr.Brigman, orthopedic oncology at  [facility].  HTN: Currently blood pressure stable.  Will resume her home meds.  COPD :Currently stable.  Not in exacerbation.  Depression/anxiety/generalized anxiety disorder: Resume her meds on discharge.  CKD stage III: Stable and is at baseline.  Patient evaluated by physical therapy and recommended skilled nursing facility.  Social worker has been consulted.     DVT prophylaxis:  Lovenox Code Status: Full Family Communication: None presented to the bedside Disposition Plan: Skilled nursing facility in 1-2 days.   Consultants: Orthopedics  Procedures: intramedullary retrograde femoral nailing on the left Antimicrobials:None  Subjective:  Patient seen and examined at the bedside this morning.  Remained comfortable.  Complaining of some pain on the left lower extremity at operated site.  Objective: Vitals:   01/12/17 1745 01/12/17 1824 01/12/17 2000 01/13/17 0501  BP: (!) 121/57 127/71 (!) 135/58 (!) 90/46  Pulse: 73 74 70 64  Resp: 14 17 16 18   Temp:  98.4 F (36.9 C) 98.3 F (36.8 C) 98.2 F (36.8 C)  TempSrc:  Oral Oral Oral  SpO2: 95% 96% 99% 96%  Weight:      Height:        Intake/Output Summary (Last 24 hours) at 01/13/2017 1528 Last data filed at 01/13/2017 0502 Gross per 24 hour  Intake 2817.5 ml  Output 1500 ml  Net 1317.5 ml   Filed Weights   01/12/17 1452  Weight: 80.7 kg (178 lb)    Examination:  General exam: Weak,Appears calm,Not in distress,average built Respiratory system: Bilateral equal air entry, normal vesicular breath sounds, no wheezes or crackles  Cardiovascular system: S1 & S2 heard, RRR. No JVD, murmurs, rubs, gallops or clicks. No pedal edema. Gastrointestinal system: Abdomen is nondistended, soft  and nontender. No organomegaly or masses felt. Normal bowel sounds heard. Central nervous system: Alert and oriented. No focal neurological deficits. Extremities: No edema, no clubbing ,no cyanosis, distal peripheral pulses  palpable. Left lower extremity wrapped with dressings Skin: No rashes, lesions or ulcers,no icterus ,no pallor Psychiatry: Judgement and insight appear normal. Mood & affect appropriate.     Data Reviewed: I have personally reviewed following labs and imaging studies  CBC: Recent Labs  Lab 01/12/17 0730 01/13/17 0337  WBC 8.9 8.4  HGB 12.3 10.0*  HCT 37.7 30.8*  MCV 96.4 96.9  PLT 253 676   Basic Metabolic Panel: Recent Labs  Lab 01/12/17 0730 01/13/17 0337  NA 142 137  K 4.2 3.5  CL 106 103  CO2 28 28  GLUCOSE 120* 133*  BUN 15 16  CREATININE 0.90 0.94  CALCIUM 9.7 8.9   GFR: Estimated Creatinine Clearance: 49.9 mL/min (by C-G formula based on SCr of 0.94 mg/dL). Liver Function Tests: No results for input(s): AST, ALT, ALKPHOS, BILITOT, PROT, ALBUMIN in the last 168 hours. No results for input(s): LIPASE, AMYLASE in the last 168 hours. No results for input(s): AMMONIA in the last 168 hours. Coagulation Profile: No results for input(s): INR, PROTIME in the last 168 hours. Cardiac Enzymes: No results for input(s): CKTOTAL, CKMB, CKMBINDEX, TROPONINI in the last 168 hours. BNP (last 3 results) No results for input(s): PROBNP in the last 8760 hours. HbA1C: No results for input(s): HGBA1C in the last 72 hours. CBG: No results for input(s): GLUCAP in the last 168 hours. Lipid Profile: No results for input(s): CHOL, HDL, LDLCALC, TRIG, CHOLHDL, LDLDIRECT in the last 72 hours. Thyroid Function Tests: No results for input(s): TSH, T4TOTAL, FREET4, T3FREE, THYROIDAB in the last 72 hours. Anemia Panel: No results for input(s): VITAMINB12, FOLATE, FERRITIN, TIBC, IRON, RETICCTPCT in the last 72 hours. Sepsis Labs: No results for input(s): PROCALCITON, LATICACIDVEN in the last 168 hours.  No results found for this or any previous visit (from the past 240 hour(s)).       Radiology Studies: Dg Chest Port 1 View  Result Date: 01/12/2017 CLINICAL DATA:  Preop.   Femur fracture. EXAM: PORTABLE CHEST 1 VIEW COMPARISON:  09/26/2016 FINDINGS: The cardiac silhouette is upper limits of normal in size, unchanged. Aortic atherosclerosis is noted. The lungs are slightly hyperinflated with slight interstitial coarsening consistent with history of COPD. No confluent airspace opacity, edema, pleural effusion, or pneumothorax is identified. IMPRESSION: No active disease. Electronically Signed   By: Logan Bores M.D.   On: 01/12/2017 10:27   Dg C-arm 1-60 Min-no Report  Result Date: 01/12/2017 Fluoroscopy was utilized by the requesting physician.  No radiographic interpretation.   Dg Hip Unilat W Or Wo Pelvis 2-3 Views Left  Result Date: 01/12/2017 CLINICAL DATA:  Slipped to the for while getting into a wheelchair this morning. The patient is complaining of left femur pain. Patient has known metastatic disease to the right hip. EXAM: DG HIP (WITH OR WITHOUT PELVIS) 2-3V LEFT COMPARISON:  Left femur series of today's date FINDINGS: The bones are subjectively osteopenic. The bony pelvis appears intact. AP and lateral views of the left hip reveal narrowing of the joint space. The acetabulum and femoral head appear intact. The femoral neck, intertrochanteric, and immediate sub trochanteric regions are normal. IMPRESSION: There is mild osteoarthritic joint space loss of the left hip. No acute fracture or lytic or blastic lesion of the left hip is observed. Electronically Signed   By: Shanon Brow  [place] M.D.   On: 01/12/2017 10:03   Dg Femur Min 2 Views Left  Result Date: 01/12/2017 CLINICAL DATA:  Open reduction internal fixation for fracture EXAM: LEFT FEMUR 2 VIEWS COMPARISON:  Preoperative evaluation January 12, 2017 FLUOROSCOPY TIME:  1 minutes 27 seconds; 9.10 mGy ; 4 acquired images FINDINGS: Frontal and lateral views were obtained. There is screw and nail fixation through a comminuted fracture of the distal femoral diaphysis. Alignment at the fracture site is near anatomic.  No other fractures are evident. No dislocation. Visualized joint spaces appear unremarkable. IMPRESSION: Near anatomic alignment following screw and nail fixation through a comminuted fracture of the distal femoral diaphysis. No new fracture evident. No evident dislocation. Electronically Signed   By: Lowella Grip III M.D.   On: 01/12/2017 18:51   Dg Femur Min 2 Views Left  Result Date: 01/12/2017 CLINICAL DATA:  The patient slipped to the floor while getting into this wheelchair this morning. The patient is complaining of left hip pain and has obvious deformity. EXAM: LEFT FEMUR 2 VIEWS COMPARISON:  Left hip series of today's date. FINDINGS: There is an acute displaced fracture of the distal radial metadiaphysis. There is angulation with the apex anterior. The fracture line does not reach the femoral condyles. More proximally the femoral shaft is intact. The femoral head, neck, and intertrochanteric regions or included on the left hip series and wrist reveal no acute abnormalities. IMPRESSION: There is an acute angulated displaced fracture of the distal left femoral metadiaphysis. Electronically Signed   By: David  Martinique M.D.   On: 01/12/2017 10:04        Scheduled Meds: . acetaminophen  1,000 mg Oral TID  . carvedilol  25 mg Oral BID WC  . chlorhexidine  60 mL Topical Once  . enoxaparin (LOVENOX) injection  40 mg Subcutaneous Q24H  . gabapentin  200 mg Oral BID  . gabapentin  300 mg Oral QHS   Continuous Infusions: . sodium chloride 75 mL/hr at 01/13/17 1003  . dextrose 5 % and 0.45 % NaCl with KCl 20 mEq/L 50 mL/hr at 01/12/17 2021  . lactated ringers 20 mL/hr at 01/12/17 1436  . methocarbamol (ROBAXIN)  IV       LOS: 1 day    Time spent:     Marene Lenz, MD [facility] Pager [phone]  If 7PM-7AM, please contact night-coverage [email or web address] Password [email or web address] 01/13/2017, 3:28 PM

## 2017-01-13 NOTE — Progress Notes (Signed)
Subjective: 1 Day Post-Op Procedure(s) (LRB): INTRAMEDULLARY (IM) RETROGRADE FEMORAL NAILING (Left)  Patient reports pain as mild to moderate.  Has not had anything to eat since surgery, but reports that she is ready for breakfast.  Admits to flatus.  Denies fever, chills, N/V, SOB, CP.  Objective:   VITALS:  Temp:  [98.2 F (36.8 C)-98.4 F (36.9 C)] 98.2 F (36.8 C) (12/28 0501) Pulse Rate:  [64-75] 64 (12/28 0501) Resp:  [14-19] 18 (12/28 0501) BP: (90-170)/(46-84) 90/46 (12/28 0501) SpO2:  [92 %-100 %] 96 % (12/28 0501) Weight:  [80.7 kg (178 lb)] 80.7 kg (178 lb) (12/27 1452)  General: WDWN patient in NAD. Psych:  Appropriate mood and affect. Neuro:  A&O x 3, Moving all extremities, sensation intact to light touch HEENT:  EOMs intact Chest:  Even non-labored respirations Skin:  Dressing C/D/I, no rashes or lesions Extremities: warm/dry, mild edema, no erythema or echymosis.  No lymphadenopathy. Pulses: Femoral 2+ MSK:  ROM: full ankle ROM, MMT: able to perform quad set, (-) Homan's    LABS Recent Labs    01/12/17 0730 01/13/17 0337  HGB 12.3 10.0*  WBC 8.9 8.4  PLT 253 223   Recent Labs    01/12/17 0730 01/13/17 0337  NA 142 137  K 4.2 3.5  CL 106 103  CO2 28 28  BUN 15 16  CREATININE 0.90 0.94  GLUCOSE 120* 133*   No results for input(s): LABPT, INR in the last 72 hours.   Assessment/Plan: 1 Day Post-Op Procedure(s) (LRB): INTRAMEDULLARY (IM) RETROGRADE FEMORAL NAILING (Left)  Up with therapy WBAT L LE Resume blood thinners today. Oxycodone for D/C pain control.   Lovenox for DVT prophylaxis upon D/C. Scripts on chart. Plan for 2 week outpatient post-op visit with Dr. Milus Height, [facility] Office:  [phone]

## 2017-01-13 NOTE — Discharge Instructions (Signed)
Wylene Simmer, MD [place]  Please read the following information regarding your care after surgery.  Medications  You only need a prescription for the narcotic pain medicine (ex. oxycodone, Percocet, Norco).  All of the other medicines listed below are available over the counter. X Aleve 2 pills twice a day for the first 3 days after surgery. X acetominophen (Tylenol) 650 mg every 4-6 hours as you need for minor to moderate pain X oxycodone as prescribed for severe pain  Narcotic pain medicine (ex. oxycodone, Percocet, Vicodin) will cause constipation.  To prevent this problem, take the following medicines while you are taking any pain medicine. X docusate sodium (Colace) 100 mg twice a day X senna (Senokot) 2 tablets twice a day  X Inject lovenox once a day for two weeks after surgery.  You should also get up every hour while you are awake to move around.    Weight Bearing X Bear weight when you are able on your operated leg or foot.   Cast / Splint / Dressing X Keep your splint, cast or dressing clean and dry.  Dont put anything (coat hanger, pencil, etc) down inside of it.  If it gets damp, use a hair dryer on the cool setting to dry it.  If it gets soaked, call the office to schedule an appointment for a cast change.   After your dressing, cast or splint is removed; you may shower, but do not soak or scrub the wound.  Allow the water to run over it, and then gently pat it dry.  Swelling It is normal for you to have swelling where you had surgery.  To reduce swelling and pain, keep your toes above your nose for at least 3 days after surgery.  It may be necessary to keep your foot or leg elevated for several weeks.  If it hurts, it should be elevated.  Follow Up Call my office at [phone] when you are discharged from the hospital or surgery center to schedule an appointment to be seen two weeks after surgery.  Call my office at [phone] if you develop a fever  >101.5 F, nausea, vomiting, bleeding from the surgical site or severe pain.

## 2017-01-14 LAB — CBC WITH DIFFERENTIAL/PLATELET
Basophils Absolute: 0 10*3/uL (ref 0.0–0.1)
Basophils Relative: 0 %
Eosinophils Absolute: 0.3 10*3/uL (ref 0.0–0.7)
Eosinophils Relative: 3 %
HCT: 29.5 % — ABNORMAL LOW (ref 36.0–46.0)
HEMOGLOBIN: 9.8 g/dL — AB (ref 12.0–15.0)
LYMPHS ABS: 0.6 10*3/uL — AB (ref 0.7–4.0)
LYMPHS PCT: 7 %
MCH: 31.9 pg (ref 26.0–34.0)
MCHC: 33.2 g/dL (ref 30.0–36.0)
MCV: 96.1 fL (ref 78.0–100.0)
Monocytes Absolute: 0.9 10*3/uL (ref 0.1–1.0)
Monocytes Relative: 10 %
NEUTROS ABS: 7.2 10*3/uL (ref 1.7–7.7)
NEUTROS PCT: 80 %
Platelets: 185 10*3/uL (ref 150–400)
RBC: 3.07 MIL/uL — AB (ref 3.87–5.11)
RDW: 13.9 % (ref 11.5–15.5)
WBC: 9 10*3/uL (ref 4.0–10.5)

## 2017-01-14 NOTE — Progress Notes (Addendum)
[facility] ([facility]) GIP SW visit.  As previously documented by [facility] RN, this is a related and covered GIP admission of 01/12/2017 with [facility] diagnosis of lung cancer. Code status: Full code post surgery. Patient experienced a fall and fractured left femur. She activated EMS and was transported to [facility] ED for evaluation. Hospice was notified after she was transported.   Visited briefly with patient this morning as breakfast tray was being delivered. At that time patient was asking to see RN. Upon return, patient resting with eyes closed. She responded to voice and confirmed RN addressed her needs. She tells me she is expecting her son Melissa Ferrell to visit today. She drank most of orange juice and took one bite of biscuit. She tells me she is not hungry. She drifted back to sleep.   Spoke with patient's [place] who visited patient yesterday. Lenna Sciara is in touch with son Melissa Ferrell.   Will continue to follow daily and anticipate discharge needs. Melissa will meet with patient/son if [facility] paper work is needed before patient leaves the hospital.   Please do not hesitate to call with [facility] related questions.   Thank you,  Melissa Conte, LCSW [phone]  [place] are listed on AMION under Hospice and [place].

## 2017-01-14 NOTE — Progress Notes (Signed)
LCSW following for disposition.  Patient is from [facility].   Patient is followed by hospice at home.   LCSW spoke with son, [name]. Patient was sleeping. LCSW attempted to meet with patient 12/28, but was unsuccessful as patient was confused.   LCSW explained the difference between hospice home and rehab.   Son still unsure of dc plan.    Carolin Coy [place]

## 2017-01-14 NOTE — Progress Notes (Signed)
Subjective: 2 Days Post-Op Procedure(s) (LRB): INTRAMEDULLARY (IM) RETROGRADE FEMORAL NAILING (Left) Patient reports pain as Mild.  Resting well. tolerating PO's. Progress with PT. Denies CP,SOB,or calf pain.  Objective: Vital signs in last 24 hours: Temp:  [98.8 F (37.1 C)-99.2 F (37.3 C)] 99.2 F (37.3 C) (12/29 0428) Pulse Rate:  [64-70] 68 (12/29 0428) Resp:  [14-16] 14 (12/29 0428) BP: (117-126)/(47-67) 117/67 (12/29 0428) SpO2:  [91 %-94 %] 92 % (12/29 0428)  Intake/Output from previous day: 12/28 0701 - 12/29 0700 In: 940 [P.O.:240; I.V.:700] Out: 300 [Urine:300] Intake/Output this shift: No intake/output data recorded.  Recent Labs    01/12/17 0730 01/13/17 0337 01/14/17 0353  HGB 12.3 10.0* 9.8*   Recent Labs    01/13/17 0337 01/14/17 0353  WBC 8.4 9.0  RBC 3.18* 3.07*  HCT 30.8* 29.5*  PLT 223 185   Recent Labs    01/12/17 0730 01/13/17 0337  NA 142 137  K 4.2 3.5  CL 106 103  CO2 28 28  BUN 15 16  CREATININE 0.90 0.94  GLUCOSE 120* 133*  CALCIUM 9.7 8.9   No results for input(s): LABPT, INR in the last 72 hours.  Alert and oriented x3. RRR, Lungs clear, BS x4. Left Calf soft and non tender. LLE dressing C/D/I. No DVT signs. No signs of infection or compartment syndrome. LLE grossly neurovascularly intact.   Assessment/Plan: 2 Days Post-Op Procedure(s) (LRB): INTRAMEDULLARY (IM) RETROGRADE FEMORAL NAILING (Left) Up with PT WABT D/c planning Continue current care  STILWELL, BRYSON L 01/14/2017, 7:48 AM

## 2017-01-14 NOTE — Progress Notes (Signed)
Pt. Asked for another dose of xanax  It is twice daily and her last dose was 1730. Paged the physician and he said it was OK to give another dose now.

## 2017-01-14 NOTE — Progress Notes (Signed)
PROGRESS NOTE    Melissa Ferrell  RCV:[id number] DOB: 04/04/1935 DOA: 01/12/2017 PCP: Girtha Rm, NP-C   Brief Narrative: Patient is a 81 year old female with past medical history of recently diagnosed malignancy of bone of unknown type, CAD, RA, hypertension, COPD, CKD who was brought to the emergency department after slipping off her chair and injuring her leg.  She was just placed on hospice supportive care for pain control.  Patient was found to have a left distal femoral fracture on presentation.  Underwent intervention by orthopedics.  Assessment & Plan:    Left Femur fracture: Likely pathological fracture with metastatic bone tumor.  Status post intramedullary retrograde femoral nailing on the left. Physical therapy following.  Weightbearing as tolerated on left lower extremity. Pain control as necessary. Will be discharged on Lovenox for DVT prophylaxis She will follow-up as an outpatient with Dr Doran Durand, Orthopedics Continue pain management efforts  Malignant neoplasm of bone: MRI from 11/30/16. Significant findings included large homogeneous mass with cortical destruction of the right sacrum. Xray of pelvis from 11/28/16 show abnormal lucency of the right sacrum With cortical destruction of the distal right sacrum She was found to have Large homogeneous mass with cortical destruction of the right sacrum as above. As per orthopedics oncology:Likely Metastatic disease vs Lymphoma vs Multiple myeloma. Also chordoma is on the differential diagnosis with the position of the tumor. Follows as an outpatient with Dr.Brigman, orthopedic oncology at [facility]. Hospice is following  Essential hypertension: Stable.  COPD :Resp status stable  Depression/anxiety/generalized anxiety disorder: Stable, resume   CKD stage III: Stable   DVT prophylaxis: Lovenox subQ Code Status: Full Family Communication: No family at the bedside Disposition Plan: SNF once pain controlled     Consultants:   Orthopedics  PCT  Procedures: intramedullary retrograde femoral nailing on the left  Antimicrobials:  None  Subjective: No overnight events.   Objective: Vitals:   01/13/17 2030 01/14/17 0428 01/14/17 1331 01/14/17 1630  BP:  117/67 (!) 134/54 138/71  Pulse: 70 68 71   Resp: 14 14 15    Temp: 99.1 F (37.3 C) 99.2 F (37.3 C) 99 F (37.2 C)   TempSrc: Oral Oral Oral   SpO2: 91% 92% 93%   Weight:      Height:        Intake/Output Summary (Last 24 hours) at 01/14/2017 1737 Last data filed at 01/14/2017 1300 Gross per 24 hour  Intake 480 ml  Output 900 ml  Net -420 ml   Filed Weights   01/12/17 1452  Weight: 80.7 kg (178 lb)    Physical Exam  Constitutional: Appears well-developed and well-nourished. No distress.   CVS: RRR, S1/S2 + Pulmonary: Effort and breath sounds normal, no stridor, rhonchi, wheezes, rales.  Abdominal: Soft. BS +,  no distension, tenderness, rebound or guarding.  Musculoskeletal: Pain in legs, palpable pulses Lymphadenopathy: No lymphadenopathy noted, cervical, inguinal. Neuro: Alert. Normal reflexes, muscle tone coordination. No cranial nerve deficit. Skin: Skin is warm and dry.  Psychiatric: Normal mood and affect. Behavior, judgment, thought content normal.   Data Reviewed: I have personally reviewed following labs and imaging studies  CBC: Recent Labs  Lab 01/12/17 0730 01/13/17 0337 01/14/17 0353  WBC 8.9 8.4 9.0  NEUTROABS  --   --  7.2  HGB 12.3 10.0* 9.8*  HCT 37.7 30.8* 29.5*  MCV 96.4 96.9 96.1  PLT 253 223 102   Basic Metabolic Panel: Recent Labs  Lab 01/12/17 0730 01/13/17 0337  NA 142 137  K 4.2 3.5  CL 106 103  CO2 28 28  GLUCOSE 120* 133*  BUN 15 16  CREATININE 0.90 0.94  CALCIUM 9.7 8.9   GFR: Estimated Creatinine Clearance: 49.9 mL/min (by C-G formula based on SCr of 0.94 mg/dL). Liver Function Tests: No results for input(s): AST, ALT, ALKPHOS, BILITOT, PROT, ALBUMIN in the  last 168 hours. No results for input(s): LIPASE, AMYLASE in the last 168 hours. No results for input(s): AMMONIA in the last 168 hours. Coagulation Profile: No results for input(s): INR, PROTIME in the last 168 hours. Cardiac Enzymes: No results for input(s): CKTOTAL, CKMB, CKMBINDEX, TROPONINI in the last 168 hours. BNP (last 3 results) No results for input(s): PROBNP in the last 8760 hours. HbA1C: No results for input(s): HGBA1C in the last 72 hours. CBG: No results for input(s): GLUCAP in the last 168 hours. Lipid Profile: No results for input(s): CHOL, HDL, LDLCALC, TRIG, CHOLHDL, LDLDIRECT in the last 72 hours. Thyroid Function Tests: No results for input(s): TSH, T4TOTAL, FREET4, T3FREE, THYROIDAB in the last 72 hours. Anemia Panel: No results for input(s): VITAMINB12, FOLATE, FERRITIN, TIBC, IRON, RETICCTPCT in the last 72 hours. Sepsis Labs: No results for input(s): PROCALCITON, LATICACIDVEN in the last 168 hours.  No results found for this or any previous visit (from the past 240 hour(s)).   Radiology Studies: No results found.   Scheduled Meds: . acetaminophen  1,000 mg Oral TID  . carvedilol  25 mg Oral BID WC  . chlorhexidine  60 mL Topical Once  . enoxaparin (LOVENOX) injection  40 mg Subcutaneous Q24H  . gabapentin  200 mg Oral BID  . gabapentin  300 mg Oral QHS  . tiotropium  18 mcg Inhalation Daily   Continuous Infusions: . sodium chloride 75 mL/hr at 01/13/17 1600  . dextrose 5 % and 0.45 % NaCl with KCl 20 mEq/L Stopped (01/13/17 1600)  . lactated ringers Stopped (01/13/17 1600)  . methocarbamol (ROBAXIN)  IV Stopped (01/13/17 1600)     LOS: 2 days    Time spent: 25 minutes   Leisa Lenz, MD [facility] [phone]   If 7PM-7AM, please contact night-coverage [email or web address] Password [facility] 01/14/2017, 5:37 PM

## 2017-01-15 DIAGNOSIS — Z515 Encounter for palliative care: Secondary | ICD-10-CM

## 2017-01-15 DIAGNOSIS — Z7189 Other specified counseling: Secondary | ICD-10-CM

## 2017-01-15 LAB — BASIC METABOLIC PANEL
Anion gap: 7 (ref 5–15)
BUN: 12 mg/dL (ref 6–20)
CHLORIDE: 104 mmol/L (ref 101–111)
CO2: 25 mmol/L (ref 22–32)
CREATININE: 0.78 mg/dL (ref 0.44–1.00)
Calcium: 8.6 mg/dL — ABNORMAL LOW (ref 8.9–10.3)
GFR calc Af Amer: >60 mL/min (ref >60)
GFR calc non Af Amer: >60 mL/min (ref >60)
GLUCOSE: 110 mg/dL — AB (ref 65–99)
POTASSIUM: 3.5 mmol/L (ref 3.5–5.1)
SODIUM: 136 mmol/L (ref 135–145)

## 2017-01-15 LAB — CBC
HCT: 31 % — ABNORMAL LOW (ref 36.0–46.0)
HEMOGLOBIN: 10.2 g/dL — AB (ref 12.0–15.0)
MCH: 30.9 pg (ref 26.0–34.0)
MCHC: 32.9 g/dL (ref 30.0–36.0)
MCV: 93.9 fL (ref 78.0–100.0)
PLATELETS: 219 10*3/uL (ref 150–400)
RBC: 3.3 MIL/uL — AB (ref 3.87–5.11)
RDW: 13.4 % (ref 11.5–15.5)
WBC: 8.9 10*3/uL (ref 4.0–10.5)

## 2017-01-15 MED ORDER — HYDROMORPHONE 1 MG/ML IV SOLN
INTRAVENOUS | Status: DC
  Administered 2017-01-15: 1.39 mg via INTRAVENOUS
  Administered 2017-01-15: 11:00:00 via INTRAVENOUS
  Administered 2017-01-15: 1.82 mg via INTRAVENOUS
  Administered 2017-01-15: 1 mg via INTRAVENOUS
  Administered 2017-01-15: 1.1 mg via INTRAVENOUS
  Administered 2017-01-16: 3.78 mg via INTRAVENOUS
  Administered 2017-01-16: 1.93 mg via INTRAVENOUS
  Filled 2017-01-15: qty 25

## 2017-01-15 MED ORDER — NALOXONE HCL 0.4 MG/ML IJ SOLN: 0.4000 mg | INTRAMUSCULAR | Status: DC | PRN

## 2017-01-15 MED ORDER — SODIUM CHLORIDE 0.9% FLUSH: 9.0000 mL | INTRAVENOUS | Status: DC | PRN

## 2017-01-15 NOTE — Progress Notes (Signed)
Subjective: 3 Days Post-Op Procedure(s) (LRB): INTRAMEDULLARY (IM) RETROGRADE FEMORAL NAILING (Left) Patient reports pain as 3 on 0-10 scale.    Objective: Vital signs in last 24 hours: Temp:  [98.4 F (36.9 C)-99 F (37.2 C)] 98.6 F (37 C) (12/30 0431) Pulse Rate:  [67-76] 76 (12/30 0431) Resp:  [15-18] 18 (12/30 0431) BP: (109-152)/(54-71) 152/69 (12/30 0431) SpO2:  [93 %-98 %] 97 % (12/30 0431)  Intake/Output from previous day: 12/29 0701 - 12/30 0700 In: 1200 [P.O.:1200] Out: 1950 [Urine:1950] Intake/Output this shift: No intake/output data recorded.  Recent Labs    01/13/17 0337 01/14/17 0353 01/15/17 0730  HGB 10.0* 9.8* 10.2*   Recent Labs    01/14/17 0353 01/15/17 0730  WBC 9.0 8.9  RBC 3.07* 3.30*  HCT 29.5* 31.0*  PLT 185 219   Recent Labs    01/13/17 0337  NA 137  K 3.5  CL 103  CO2 28  BUN 16  CREATININE 0.94  GLUCOSE 133*  CALCIUM 8.9   No results for input(s): LABPT, INR in the last 72 hours.  Neurologically intact Dorsiflexion/Plantar flexion intact Incision: dressing C/D/I and no drainage No DVT.  Assessment/Plan: 3 Days Post-Op Procedure(s) (LRB): INTRAMEDULLARY (IM) RETROGRADE FEMORAL NAILING (Left) Advance diet Up with therapy Discharge to SNF when ready  Melissa Ferrell C 01/15/2017, 8:32 AM

## 2017-01-15 NOTE — Progress Notes (Signed)
[facility]  Patient revoked hospice services today to pursue services outside the hospice plan of care. Made CSW Jarrett Soho aware.   Thank you,  Erling Conte, LCSW [phone]

## 2017-01-15 NOTE — Progress Notes (Signed)
PROGRESS NOTE    Melissa Ferrell  GYF:[id number] DOB: [birth year] DOA: 01/12/2017 PCP: Girtha Rm, NP-C   Brief Narrative: Patient is a 81 year old female with past medical history of recently diagnosed malignancy of bone of unknown type, CAD, RA, hypertension, COPD, CKD who was brought to the emergency department after slipping off her chair and injuring her leg.  She was just placed on hospice supportive care for pain control.  Patient was found to have a left distal femoral fracture on presentation.  Underwent intervention by orthopedics.  Assessment & Plan:   Left Femur fracture - Likely pathological fracture with metastatic bone tumor.  Status post intramedullary retrograde femoral nailing on the left. - Discharge to SNF once ready, per ortho continue Lovenox for DVT prophylaxis - Outpt follow up with ortho - Continue pain management efforts   Malignant neoplasm of bone: - Hospice is following - Rad onc consult for palliative radiotherapy  Essential hypertension - Stable   COPD - Stable resp status   Depression/anxiety/generalized anxiety disorder - Stable   CKD stage III - Check BMP in am   DVT prophylaxis: Lovenox subQ Code Status: Full Family Communication: no family at the bedside Disposition Plan: SNF once pain controlled    Consultants:   Orthopedics  PCT  Rad oncology  Procedures:  Intramedullary retrograde femoral nailing on the left   Antimicrobials:  None  Subjective: No overnight events.   Objective: Vitals:   01/14/17 2034 01/15/17 0431 01/15/17 1233 01/15/17 1407  BP: 109/67 (!) 152/69  (!) 129/54  Pulse: 67 76  73  Resp: 18 18 (!) 22 18  Temp: 98.4 F (36.9 C) 98.6 F (37 C)  99.6 F (37.6 C)  TempSrc: Oral Oral  Oral  SpO2: 98% 97% 93% 95%  Weight:      Height:        Intake/Output Summary (Last 24 hours) at 01/15/2017 1448 Last data filed at 01/15/2017 0444 Gross per 24 hour  Intake 720 ml  Output 1350 ml    Net -630 ml   Filed Weights   01/12/17 1452  Weight: 80.7 kg (178 lb)    Physical Exam  Constitutional: Appears well-developed and well-nourished. No distress.   CVS: RRR, S1/S2 + Pulmonary: Effort and breath sounds normal, no stridor, rhonchi, wheezes, rales.  Abdominal: Soft. BS +,  no distension, tenderness, rebound or guarding.  Musculoskeletal: pain in legs, palpable pulses Lymphadenopathy: No lymphadenopathy noted, cervical, inguinal. Neuro: Alert. No cranial nerve deficit. Skin: Skin is warm and dry. No rash noted. Not diaphoretic. No erythema. No pallor.  Psychiatric: Normal mood and affect. Behavior, judgment, thought content normal.    Data Reviewed: I have personally reviewed following labs and imaging studies  CBC: Recent Labs  Lab 01/12/17 0730 01/13/17 0337 01/14/17 0353 01/15/17 0730  WBC 8.9 8.4 9.0 8.9  NEUTROABS  --   --  7.2  --   HGB 12.3 10.0* 9.8* 10.2*  HCT 37.7 30.8* 29.5* 31.0*  MCV 96.4 96.9 96.1 93.9  PLT 253 223 185 916   Basic Metabolic Panel: Recent Labs  Lab 01/12/17 0730 01/13/17 0337 01/15/17 0730  NA 142 137 136  K 4.2 3.5 3.5  CL 106 103 104  CO2 28 28 25   GLUCOSE 120* 133* 110*  BUN 15 16 12   CREATININE 0.90 0.94 0.78  CALCIUM 9.7 8.9 8.6*   GFR: Estimated Creatinine Clearance: 58.6 mL/min (by C-G formula based on SCr of 0.78 mg/dL). Liver Function Tests: No results for  input(s): AST, ALT, ALKPHOS, BILITOT, PROT, ALBUMIN in the last 168 hours. No results for input(s): LIPASE, AMYLASE in the last 168 hours. No results for input(s): AMMONIA in the last 168 hours. Coagulation Profile: No results for input(s): INR, PROTIME in the last 168 hours. Cardiac Enzymes: No results for input(s): CKTOTAL, CKMB, CKMBINDEX, TROPONINI in the last 168 hours. BNP (last 3 results) No results for input(s): PROBNP in the last 8760 hours. HbA1C: No results for input(s): HGBA1C in the last 72 hours. CBG: No results for input(s): GLUCAP in  the last 168 hours. Lipid Profile: No results for input(s): CHOL, HDL, LDLCALC, TRIG, CHOLHDL, LDLDIRECT in the last 72 hours. Thyroid Function Tests: No results for input(s): TSH, T4TOTAL, FREET4, T3FREE, THYROIDAB in the last 72 hours. Anemia Panel: No results for input(s): VITAMINB12, FOLATE, FERRITIN, TIBC, IRON, RETICCTPCT in the last 72 hours. Sepsis Labs: No results for input(s): PROCALCITON, LATICACIDVEN in the last 168 hours.  No results found for this or any previous visit (from the past 240 hour(s)).   Radiology Studies: No results found.   Scheduled Meds: . acetaminophen  1,000 mg Oral TID  . carvedilol  25 mg Oral BID WC  . chlorhexidine  60 mL Topical Once  . enoxaparin (LOVENOX) injection  40 mg Subcutaneous Q24H  . gabapentin  200 mg Oral BID  . gabapentin  300 mg Oral QHS  . HYDROmorphone   Intravenous Q4H  . tiotropium  18 mcg Inhalation Daily   Continuous Infusions: . sodium chloride 75 mL/hr at 01/15/17 1301  . dextrose 5 % and 0.45 % NaCl with KCl 20 mEq/L Stopped (01/13/17 1600)  . lactated ringers Stopped (01/13/17 1600)  . methocarbamol (ROBAXIN)  IV Stopped (01/13/17 1600)     LOS: 3 days    Time spent: 25 minutes   Leisa Lenz, MD [facility] [phone]   If 7PM-7AM, please contact night-coverage [email or web address] Password [email or web address] 01/15/2017, 2:48 PM

## 2017-01-15 NOTE — Consult Note (Signed)
Consultation Note Date: 01/15/2017   Patient Name: Melissa Ferrell  DOB: 01-Oct-1935  MRN: [id number]  Age / Sex: 81 y.o., female  PCP: Girtha Rm, NP-C Referring Physician: Robbie Lis, MD  Reason for Consultation: Establishing goals of care, pain management.   HPI/Patient Profile: 81 y.o. female    admitted on 01/12/2017    Clinical Assessment and Goals of Care:  81 year old lady with a past medical history significant for recently diagnosed malignancy of the bone, possibly lung primary, history of underlying coronary artery disease hypertension rheumatoid arthritis chronic obstructive pulmonary disease chronic kidney disease. Patient lives at [facility] in independent living. Patient slipped off of her chair injured her leg and was brought in with left distal femoral fracture. Since the patient was diagnosed with cancer recently, she was assigned to hospice services for pain management.  In the hospital, the patient underwent intramedullary femoral nailing on the left. Orthopedic specialists are following. Patient is on Lovenox for DVT prophylaxis, is to have weightbearing as tolerated, physical therapy, possible transfer to skilled nursing facility on discharge.  Palliative consultation for pain management, overall goals of care discussions.  Patient is in pain. She complains of pain in her lower back complaints of pain in sacral area. She is not comfortable. She states that nothing helps. She has a sharp/shooting type of pain in her back/behind area. She sees an Materials engineer at [facility] I met with her and her son [name] who is visiting from [place] who is her healthcare power of attorney present at the bedside. I introduced myself and palliative care as follows: Palliative medicine is specialized medical care for people living with serious illness. It focuses on providing relief from the symptoms and  stress of a serious illness. The goal is to improve quality of life for both the patient and the family.  Pain management options discussed. Patient is on scheduled Tylenol. Patient has been getting IV morphine as well as IV Dilaudid. This is on an as-needed basis. Patient is also on oral oxycodone to be used on an as-needed basis. Patient has required 4 doses of 4 mg each of morphine IV in the past 24 hours. She has been given 0.5 mg of IV Dilaudid once. She has been given scheduled Tylenol. She has also been given 10 mg of oral oxycodone once in the last 24 hours. She is on Xanax and Neurontin as adjuvants that she takes chronically as well.  We discussed about appropriate pain management. We discussed about IV Dilaudid possibly in a PCA form to assess appropriate pain medication needs. Extensively discussed with patient and son about judicious use of opioids for adequate pain management. Suspect that the patient will require Dilaudid PCA at least for the next 24-48 hours and then will need to be transitioned to long-acting pain medications as well as oral oxycodone immediate release.  Goals of care discussions also undertaken at son [name]'s request. He states that he is willing to find a local oncologist and possibly local radiation oncologist here in [place], [place]. The patient does not wish to travel to [facility] to visit with her oncologist anymore. Since her diagnosis, she had completed advanced directives paperwork designating her son [name] is healthcare power of attorney agent also completed a living will, has elected DO NOT RESUSCITATE/DO NOT INTUBATE at end-of-life. Son states that the patient is an extremely independent person and wish to remain in her independent living environment at [facility] hence hospice was started.  See recommendations below. Palliative care will continue to follow along. Thank you for the consult.  HCPOA  son [name] who is visiting from [place] is her HCPOA  agent. She has a living will, she has elected DNR DNI at end of life.   SUMMARY OF RECOMMENDATIONS   Dilaudid PCA Code status now DNR DNI Medical Oncology consult requested by patient and son, she doesn't want to travel to [facility] oncology anymore. Son asking if there is any role for palliative radiation, patient might need radiation oncologist input as well.   Code Status/Advance Care Planning:  DNR    Symptom Management:    see above   Palliative Prophylaxis:   Bowel Regimen  Psycho-social/Spiritual:   Desire for further Chaplaincy support:no  Additional Recommendations: Caregiving  Support/Resources  Prognosis:   Guarded   Discharge Planning: [place] for rehab with Palliative care service follow-up      Primary Diagnoses: Present on Admission: . Femur fracture ([place]) . CKD (chronic kidney disease) stage 3, GFR 30-59 ml/min (HCC) . COPD (chronic obstructive pulmonary disease) with emphysema ([place]) . Depression and anxiety . Essential hypertension . Generalized anxiety disorder   I have reviewed the medical record, interviewed the patient and family, and examined the patient. The following aspects are pertinent.  Past Medical History:  Diagnosis Date  . Anxiety and depression   . Carotid artery disease (HCC)    hx CAD, dCHF listed in [place] - sees cardiologist, vasc and neurologist  . CKD (chronic kidney disease) stage 3, GFR 30-59 ml/min ([place]) 08/24/2016  . COPD (chronic obstructive pulmonary disease) [facility])    sees pulmonology  . DDD (degenerative disc disease)   . Disseminated superficial actinic porokeratosis    bilateral calves  . Fracture, spinal   . GERD (gastroesophageal reflux disease)    hx PUD 2ndary to NSAIDs 1975  . Hemorrhoids   . HTN (hypertension)   . Hx of adenomatous colonic polyps   . Hyperlipidemia    intol to statins  . IBS (irritable bowel syndrome)   . Memory loss    seen by neurology in 2015 and referred to neuropsych  for anxiety  . Migraine headache   . Osteopenia determined by x-ray   . RHEUMATIC FEVER, HX OF 10/15/2006   Qualifier: Diagnosis of  By: Jenny Reichmann MD, Hunt Oris   . Rheumatoid arthritis(714.0)    per her report, in remission for many years per her report  . Varicose veins    hx LE edema   Social History   Socioeconomic History  . Marital status: Divorced    Spouse name: None  . Number of children: 3  . Years of education: None  . Highest education level: None  Social Needs  . Financial resource strain: None  . Food insecurity - worry: None  . Food insecurity - inability: None  . Transportation needs - medical: None  . Transportation needs - non-medical: None  Occupational History  . Occupation: Retired-homemaker  Tobacco Use  . Smoking status: Former Smoker    Packs/day: 3.00  Years: 30.00    Pack years: 90.00    Types: Cigarettes    Last attempt to quit: 01/17/1985    Years since quitting: 32.0  . Smokeless tobacco: Never Used  . Tobacco comment: previous 80 pack year history  Substance and Sexual Activity  . Alcohol use: No  . Drug use: No  . Sexual activity: No  Other Topics Concern  . None  Social History Narrative   Work or School: volunteers at [employer] Situation: lives at [place]:       Lifestyle: no regular exercise; diet is ok      Family History  Problem Relation Age of Onset  . Heart disease Mother        MI  . Hypertension Mother   . Heart attack Mother   . Brain cancer Father   . Cancer Father   . Breast cancer Maternal Grandmother   . Stomach cancer Brother   . Cancer Brother   . Heart disease Brother   . Hypertension Brother   . Heart attack Brother   . AAA (abdominal aortic aneurysm) Brother   . Aneurysm Brother        brain  . Melanoma Brother   . Colon cancer Brother   . Heart disease Brother   . Alcohol abuse Unknown   . Heart attack Brother 45  . Allergies Sister   . Hypertension Sister     Scheduled Meds: . acetaminophen  1,000 mg Oral TID  . carvedilol  25 mg Oral BID WC  . chlorhexidine  60 mL Topical Once  . enoxaparin (LOVENOX) injection  40 mg Subcutaneous Q24H  . gabapentin  200 mg Oral BID  . gabapentin  300 mg Oral QHS  . HYDROmorphone   Intravenous Q4H  . tiotropium  18 mcg Inhalation Daily   Continuous Infusions: . sodium chloride 75 mL/hr at 01/14/17 2259  . dextrose 5 % and 0.45 % NaCl with KCl 20 mEq/L Stopped (01/13/17 1600)  . lactated ringers Stopped (01/13/17 1600)  . methocarbamol (ROBAXIN)  IV Stopped (01/13/17 1600)   PRN Meds:.acetaminophen **OR** acetaminophen, albuterol, ALPRAZolam, HYDROmorphone (DILAUDID) injection, metoCLOPramide **OR** metoCLOPramide (REGLAN) injection, morphine injection, naloxone **AND** sodium chloride flush, ondansetron **OR** ondansetron (ZOFRAN) IV, oxyCODONE, oxyCODONE Medications Prior to Admission:  Prior to Admission medications   Medication Sig Start Date End Date Taking? Authorizing Provider  acetaminophen (TYLENOL) 500 MG tablet Take 1,000 mg by mouth 3 (three) times daily.    Yes [provider]  albuterol (PROVENTIL HFA;VENTOLIN HFA) 108 (90 Base) MCG/ACT inhaler Inhale 2 puffs into the lungs every 6 (six) hours as needed for wheezing or shortness of breath. 09/26/16  Yes Juanito Doom, MD  ALPRAZolam Duanne Moron) 0.25 MG tablet Take 1 tablet (0.25 mg total) by mouth 2 (two) times daily as needed for anxiety. Patient taking differently: Take 0.125 mg by mouth 2 (two) times daily as needed for anxiety.  12/22/16  Yes Henson, Vickie L, NP-C  aspirin 81 MG tablet Take 81 mg by mouth 2 (two) times daily.    Yes [provider]  Carboxymethylcellul-Glycerin (OPTIVE) 0.5-0.9 % SOLN Place 1 drop into both eyes daily. --dry eyes   Yes [provider]  carvedilol (COREG) 12.5 MG tablet Take 2 tablets (25 mg total) by mouth 2 (two) times daily. 12/19/16  Yes Lelon Perla, MD  doxycycline  (VIBRA-TABS) 100 MG tablet Take 1 tablet (100 mg  total) by mouth 2 (two) times daily. Patient taking differently: Take 100 mg by mouth See admin instructions. Only takes when she feels a cold coming on. When she takes this medication it is taken 1 tablet twice a day. 09/26/16  Yes Juanito Doom, MD  folic acid (FOLVITE) 381 MCG tablet Take 400 mcg by mouth daily.   Yes [provider]  gabapentin (NEURONTIN) 100 MG capsule Take 1 capsule (100 mg total) by mouth 2 (two) times daily. Patient taking differently: Take 200 mg by mouth 2 (two) times daily.  12/29/16  Yes Henson, Vickie L, NP-C  gabapentin (NEURONTIN) 300 MG capsule Take 1 capsule (300 mg total) by mouth daily. Take at bedtime Patient taking differently: Take 300 mg by mouth at bedtime.  12/29/16  Yes Henson, Vickie L, NP-C  KRILL OIL PO Take 500 mg by mouth daily.    Yes [provider]  Lecithin 1200 MG CAPS Take 1,200 mg by mouth every morning.    Yes [provider]  losartan (COZAAR) 50 MG tablet TAKE 1 TABLET(50 MG) BY MOUTH TWICE DAILY 10/25/16  Yes Crenshaw, Denice Bors, MD  Magnesium 250 MG TABS Take 250 mg by mouth daily.    Yes [provider]  multivitamin-lutein (OCUVITE-LUTEIN) CAPS capsule Take 1 capsule by mouth daily.   Yes [provider]  tiotropium (SPIRIVA) 18 MCG inhalation capsule Place 1 capsule (18 mcg total) into inhaler and inhale daily. 09/26/16  Yes Juanito Doom, MD  vitamin B-12 (CYANOCOBALAMIN) 500 MCG tablet Take 500 mcg by mouth every morning.    Yes [provider]  enoxaparin (LOVENOX) 40 MG/0.4ML injection Inject 0.4 mLs (40 mg total) into the skin daily. For 2 weeks. 01/13/17   Corky Sing, PA-C  hydrochlorothiazide (MICROZIDE) 12.5 MG capsule Take 1 capsule (12.5 mg total) by mouth daily. Patient not taking: Reported on 12/22/2016 11/17/16 02/15/17  Lelon Perla, MD  oxyCODONE (ROXICODONE) 5 MG immediate release tablet Take 1 tablet (5  mg total) by mouth every 4 (four) hours as needed for moderate pain or severe pain. For no more than 5 days. 01/13/17   Corky Sing, PA-C   Allergies  Allergen Reactions  . Atrovent [Ipratropium] Anaphylaxis  . Bee Venom Anaphylaxis  . Qvar [Beclomethasone] Anaphylaxis  . Stiolto Respimat [Tiotropium Bromide-Olodaterol] Anaphylaxis  . Anoro Ellipta [Umeclidinium-Vilanterol]     HEADACHES  . Prednisone Other (See Comments)    Per patient: was told that she was not to take it by her early Rheumatology physician  . Benzocaine Itching and Other (See Comments)    Blisters  . Breo Ellipta [Fluticasone Furoate-Vilanterol]     Headache, anxiety.   Thayer Jew Hcl]   . Cefdinir Other (See Comments)    unknown  . Ciprofloxacin Other (See Comments)    unknown  . Diphenhydramine Hcl Other (See Comments)    "benadryl"--pain   . Erythromycin Other (See Comments)    Headache   . Indomethacin Other (See Comments)    Headache   . Lasix [Furosemide] Other (See Comments)    Low tolerance - swelling    . Other     Adhesive tape--blisters  . Statins     Low tolerance    . Tobramycin Itching  . Zetia [Ezetimibe]     Low tolerance   . Azithromycin Rash  . Paba Derivatives Rash  . Penicillins Itching and Rash    Has patient had a PCN reaction causing immediate rash,  facial/tongue/throat swelling, SOB or lightheadedness with hypotension: No Has patient had a PCN reaction causing severe rash involving mucus membranes or skin necrosis: No Has patient had a PCN reaction that required hospitalization: No Has patient had a PCN reaction occurring within the last 10 years: No If all of the above answers are "NO", then may proceed with Cephalosporin use.    Review of Systems + back pain +sacral area pain  Physical Exam Awake alert In pain, does not interact much Is oriented Clear Abdomen soft Regular No edema L LE dressing noted  Vital Signs: BP (!) 152/69 (BP  Location: Left Arm)   Pulse 76   Temp 98.6 F (37 C) (Oral)   Resp 18   Ht 5' 4"  (1.626 m)   Wt 80.7 kg (178 lb)   SpO2 97%   BMI 30.55 kg/m  Pain Assessment: 0-10   Pain Score: 10-Worst pain ever   SpO2: SpO2: 97 % O2 Device:SpO2: 97 % O2 Flow Rate: .O2 Flow Rate (L/min): 2 L/min  IO: Intake/output summary:   Intake/Output Summary (Last 24 hours) at 01/15/2017 1022 Last data filed at 01/15/2017 0444 Gross per 24 hour  Intake 960 ml  Output 1950 ml  Net -990 ml   PPS 40% LBM: Last BM Date: (PTA) Baseline Weight: Weight: 80.7 kg (178 lb) Most recent weight: Weight: 80.7 kg (178 lb)     Palliative Assessment/Data:   Flowsheet Rows     Most Recent Value  Intake Tab  Referral Department  Hospitalist  Unit at Time of Referral  Oncology Unit  Palliative Care Primary Diagnosis  Cancer  Palliative Care Type  New Palliative care  Reason for referral  Pain  Date first seen by Palliative Care  01/15/17  Clinical Assessment  Palliative Performance Scale Score  30%  Pain Max last 24 hours  6  Pain Min Last 24 hours  4  Dyspnea Max Last 24 Hours  3  Dyspnea Min Last 24 hours  2  Nausea Max Last 24 Hours  3  Nausea Min Last 24 Hours  2  Anxiety Max Last 24 Hours  4  Anxiety Min Last 24 Hours  3  Psychosocial & Spiritual Assessment  Palliative Care Outcomes  Patient/Family meeting held?  Yes  Who was at the meeting?  patient, son [name] who is HCPOA agent.       Time In:  9 Time Out:10.30   Time Total:  90 min  Greater than 50%  of this time was spent counseling and coordinating care related to the above assessment and plan.  Signed by: Loistine Chance, MD  [phone]  Please contact Palliative Medicine Team phone at [phone] for questions and concerns.  For individual provider: See Shea Evans

## 2017-01-16 ENCOUNTER — Ambulatory Visit: Payer: Medicare Other

## 2017-01-16 ENCOUNTER — Ambulatory Visit
Admit: 2017-01-16 | Discharge: 2017-01-16 | Disposition: A | Discharge status: Home / Self Care | Payer: Medicare Other | Attending: Radiation Oncology | Admitting: Radiation Oncology

## 2017-01-16 LAB — BASIC METABOLIC PANEL
ANION GAP: 5 (ref 5–15)
BUN: 13 mg/dL (ref 6–20)
CHLORIDE: 105 mmol/L (ref 101–111)
CO2: 26 mmol/L (ref 22–32)
Calcium: 8.8 mg/dL — ABNORMAL LOW (ref 8.9–10.3)
Creatinine, Ser: 0.82 mg/dL (ref 0.44–1.00)
GFR calc Af Amer: >60 mL/min (ref >60)
Glucose, Bld: 94 mg/dL (ref 65–99)
POTASSIUM: 3.5 mmol/L (ref 3.5–5.1)
SODIUM: 136 mmol/L (ref 135–145)

## 2017-01-16 LAB — CBC
HCT: 27.9 % — ABNORMAL LOW (ref 36.0–46.0)
HEMOGLOBIN: 9.3 g/dL — AB (ref 12.0–15.0)
MCH: 31.8 pg (ref 26.0–34.0)
MCHC: 33.3 g/dL (ref 30.0–36.0)
MCV: 95.5 fL (ref 78.0–100.0)
Platelets: 224 10*3/uL (ref 150–400)
RBC: 2.92 MIL/uL — AB (ref 3.87–5.11)
RDW: 13.9 % (ref 11.5–15.5)
WBC: 6.3 10*3/uL (ref 4.0–10.5)

## 2017-01-16 MED ORDER — ALPRAZOLAM 0.25 MG PO TABS
0.2500 mg | ORAL_TABLET | Freq: Two times a day (BID) | ORAL | Status: DC | PRN
  Administered 2017-01-16 – 2017-01-18 (×3): 0.25 mg via ORAL
  Filled 2017-01-16 (×3): qty 1

## 2017-01-16 MED ORDER — SODIUM CHLORIDE 0.9 % IV SOLN
0.3000 mg/h | INTRAVENOUS | Status: DC
  Administered 2017-01-16 – 2017-01-21 (×5): 0.3 mg/h via INTRAVENOUS
  Filled 2017-01-16 (×2): qty 5

## 2017-01-16 MED ORDER — HYDROMORPHONE HCL 1 MG/ML IJ SOLN
0.5000 mg | Freq: Once | INTRAMUSCULAR | Status: AC
  Administered 2017-01-16: 0.5 mg via INTRAVENOUS
  Filled 2017-01-16: qty 1

## 2017-01-16 MED ORDER — HYDROXYZINE HCL 50 MG/ML IM SOLN
25.0000 mg | Freq: Four times a day (QID) | INTRAMUSCULAR | Status: DC | PRN
  Administered 2017-01-16 – 2017-01-18 (×4): 25 mg via INTRAMUSCULAR
  Filled 2017-01-16 (×5): qty 0.5

## 2017-01-16 MED ORDER — LIDOCAINE 5 % EX PTCH
1.0000 | MEDICATED_PATCH | CUTANEOUS | Status: DC
  Administered 2017-01-17 – 2017-01-20 (×2): 1 via TRANSDERMAL
  Filled 2017-01-16 (×6): qty 1

## 2017-01-16 NOTE — Progress Notes (Signed)
Ohlman became restless with multiple pain areas and her back itching. I called pharmacy for the vistaril. Pt was repositioned in the bed and offerd water . Xanax 0.25 mg given po.  1615 She started yelling for help.She had multiple complaints, .She was repositioned and given OXY IR 5 MG. Vistaril arrived on the floor. 25 MG given  IM  at 1636.  1700 PT is resting with eyes close .Resp even and unlabored.Coreg is due now.Time will be moved up due to her sleeping so soundly

## 2017-01-16 NOTE — Progress Notes (Signed)
Subjective: 4 Days Post-Op Procedure(s) (LRB): INTRAMEDULLARY (IM) RETROGRADE FEMORAL NAILING (Left) Patient reports pain as severe.  She c/o pain in the low back and sacrum.  No significant pain from the left thigh.  Son at bedside.  Objective: Vital signs in last 24 hours: Temp:  [98.9 F (37.2 C)-99.6 F (37.6 C)] 98.9 F (37.2 C) (12/31 0430) Pulse Rate:  [68-87] 87 (12/31 0430) Resp:  [12-22] 18 (12/31 0430) BP: (110-129)/(54-60) 121/60 (12/31 0430) SpO2:  [93 %-97 %] 97 % (12/31 0430)  Intake/Output from previous day: 12/30 0701 - 12/31 0700 In: 4804.1 [P.O.:240; I.V.:4564.1] Out: 900 [Urine:900] Intake/Output this shift: No intake/output data recorded.  Recent Labs    01/14/17 0353 01/15/17 0730 01/16/17 0408  HGB 9.8* 10.2* 9.3*   Recent Labs    01/15/17 0730 01/16/17 0408  WBC 8.9 6.3  RBC 3.30* 2.92*  HCT 31.0* 27.9*  PLT 219 224   Recent Labs    01/15/17 0730 01/16/17 0408  NA 136 136  K 3.5 3.5  CL 104 105  CO2 25 26  BUN 12 13  CREATININE 0.78 0.82  GLUCOSE 110* 94  CALCIUM 8.6* 8.8*   No results for input(s): LABPT, INR in the last 72 hours.  PE:  elderly female in nad.  Alert.  Oriented to person.  L thigh dressed and dry.  NVI at L foot.  Assessment/Plan: 4 Days Post-Op Procedure(s) (LRB): INTRAMEDULLARY (IM) RETROGRADE FEMORAL NAILING (Left) Pt can bear weight as tolerated on the L LE.  Pain management for metastatic disease.  I'll continue to follow peripherally.  Please call [phone] with any ortho related questions.    Wylene Simmer 01/16/2017, 7:49 AM

## 2017-01-16 NOTE — Progress Notes (Addendum)
PROGRESS NOTE    Melissa Ferrell  QBH:[id number] DOB: [birth year] DOA: 01/12/2017 PCP: Girtha Rm, NP-C   Brief Narrative: Patient is a 81 year old female with past medical history of recently diagnosed malignancy of bone of unknown type, CAD, RA, hypertension, COPD, CKD who was brought to the emergency department after slipping off her chair and injuring her leg.  She was just placed on hospice supportive care for pain control.  Patient was found to have a left distal femoral fracture on presentation.  Underwent intervention by orthopedics.  Assessment & Plan:   Left Femur fracture - Likely pathological fracture with metastatic bone tumor.  Status post intramedullary retrograde femoral nailing on the left. - Ortho following - D/C to SNF once pain controlled - Continue pain management efforts: now on dilaudid PCA but she is continuing to experience the pain   Malignant neoplasm of bone: - Hospice is following - Rad onc consulted for palliative RT  Essential hypertension - Continue Coreg   COPD - Stable resp status  Depression/anxiety/generalized anxiety disorder - More restless this am due to itching likely from dilaudid PCA  CKD stage III - Cr now WNL  DVT prophylaxis: Lovenox subQ Code Status: DNR/DNI Family Communication: no family at the bedside Disposition Plan: SNF once pain controlled    Consultants:   Orthopedics  PCT   Rad oncology  Procedures:  Intramedullary retrograde femoral nailing on the left   Antimicrobials:  None  Subjective: Restless this am.   Objective: Vitals:   01/15/17 2338 01/16/17 0427 01/16/17 0430 01/16/17 0845  BP:   121/60   Pulse:   87   Resp: 12 18 18 16   Temp:   98.9 F (37.2 C)   TempSrc:   Oral   SpO2: 93% 93% 97% 92%  Weight:      Height:        Intake/Output Summary (Last 24 hours) at 01/16/2017 1239 Last data filed at 01/16/2017 0445 Gross per 24 hour  Intake 4804.05 ml  Output 900 ml  Net  3904.05 ml   Filed Weights   01/12/17 1452  Weight: 80.7 kg (178 lb)   Physical Exam  Constitutional: Appears well-developed and well-nourished.  CVS: RRR, S1/S2 + Pulmonary: Effort and breath sounds normal, no stridor, rhonchi, wheezes, rales.  Abdominal: Soft. BS +,  no distension, tenderness, rebound or guarding.  Musculoskeletal: Pain in legs, palpable pulses  Neuro: Awake but not oriented Skin: Skin is warm and dry. Psychiatric: Restless this am due to itching    Data Reviewed: I have personally reviewed following labs and imaging studies  CBC: Recent Labs  Lab 01/12/17 0730 01/13/17 0337 01/14/17 0353 01/15/17 0730 01/16/17 0408  WBC 8.9 8.4 9.0 8.9 6.3  NEUTROABS  --   --  7.2  --   --   HGB 12.3 10.0* 9.8* 10.2* 9.3*  HCT 37.7 30.8* 29.5* 31.0* 27.9*  MCV 96.4 96.9 96.1 93.9 95.5  PLT 253 223 185 219 097   Basic Metabolic Panel: Recent Labs  Lab 01/12/17 0730 01/13/17 0337 01/15/17 0730 01/16/17 0408  NA 142 137 136 136  K 4.2 3.5 3.5 3.5  CL 106 103 104 105  CO2 28 28 25 26   GLUCOSE 120* 133* 110* 94  BUN 15 16 12 13   CREATININE 0.90 0.94 0.78 0.82  CALCIUM 9.7 8.9 8.6* 8.8*   GFR: Estimated Creatinine Clearance: 57.2 mL/min (by C-G formula based on SCr of 0.82 mg/dL). Liver Function Tests: No results for input(s): AST,  ALT, ALKPHOS, BILITOT, PROT, ALBUMIN in the last 168 hours. No results for input(s): LIPASE, AMYLASE in the last 168 hours. No results for input(s): AMMONIA in the last 168 hours. Coagulation Profile: No results for input(s): INR, PROTIME in the last 168 hours. Cardiac Enzymes: No results for input(s): CKTOTAL, CKMB, CKMBINDEX, TROPONINI in the last 168 hours. BNP (last 3 results) No results for input(s): PROBNP in the last 8760 hours. HbA1C: No results for input(s): HGBA1C in the last 72 hours. CBG: No results for input(s): GLUCAP in the last 168 hours. Lipid Profile: No results for input(s): CHOL, HDL, LDLCALC, TRIG,  CHOLHDL, LDLDIRECT in the last 72 hours. Thyroid Function Tests: No results for input(s): TSH, T4TOTAL, FREET4, T3FREE, THYROIDAB in the last 72 hours. Anemia Panel: No results for input(s): VITAMINB12, FOLATE, FERRITIN, TIBC, IRON, RETICCTPCT in the last 72 hours. Sepsis Labs: No results for input(s): PROCALCITON, LATICACIDVEN in the last 168 hours.  No results found for this or any previous visit (from the past 240 hour(s)).   Radiology Studies: No results found.   Scheduled Meds: . acetaminophen  1,000 mg Oral TID  . carvedilol  25 mg Oral BID WC  . chlorhexidine  60 mL Topical Once  . enoxaparin (LOVENOX) injection  40 mg Subcutaneous Q24H  . gabapentin  200 mg Oral BID  . gabapentin  300 mg Oral QHS  . lidocaine  1 patch Transdermal Q24H  . tiotropium  18 mcg Inhalation Daily   Continuous Infusions: . sodium chloride 75 mL/hr at 01/16/17 0542  . dextrose 5 % and 0.45 % NaCl with KCl 20 mEq/L Stopped (01/13/17 1600)  . HYDROmorphone    . lactated ringers Stopped (01/13/17 1600)  . methocarbamol (ROBAXIN)  IV Stopped (01/13/17 1600)     LOS: 4 days    Time spent: 25 minutes   Leisa Lenz, MD [facility] [phone]   If 7PM-7AM, please contact night-coverage [email or web address] Password [facility] 01/16/2017, 12:39 PM

## 2017-01-16 NOTE — Progress Notes (Signed)
Patient is too confused to press PCA pain button. She has pulled off oxygen tubing and at IV tubing throughout the night. MD was just notified for clarification of additional pain control orders. MD said to give 0.5mg  of dilaudid q2h prn per previous electronic orders.Roderick Pee

## 2017-01-16 NOTE — Progress Notes (Signed)
Daily Progress Note   Patient Name: Melissa Ferrell       Date: 01/16/2017 DOB: [birth year]  Age: 81 y.o. MRN#: [id number] Attending Physician: Robbie Lis, MD Primary Care Physician: Girtha Rm, NP-C Admit Date: 01/12/2017  Reason for Consultation/Follow-up: Establishing goals of care  Subjective:  patient is resting in bed, she is on basal rate Dilaudid PCA, she was not able to use the bolus option on the PCA overnight as she became some what confused.  She does not open eyes, does not respond currently, she is breathing comfortably Son at bedside I discussed with him in detail Await rad onc input See below  Length of Stay: 4  Current Medications: Scheduled Meds:  . acetaminophen  1,000 mg Oral TID  . carvedilol  25 mg Oral BID WC  . chlorhexidine  60 mL Topical Once  . enoxaparin (LOVENOX) injection  40 mg Subcutaneous Q24H  . gabapentin  200 mg Oral BID  . gabapentin  300 mg Oral QHS  . HYDROmorphone   Intravenous Q4H  . lidocaine  1 patch Transdermal Q24H  . tiotropium  18 mcg Inhalation Daily    Continuous Infusions: . sodium chloride 75 mL/hr at 01/16/17 0542  . dextrose 5 % and 0.45 % NaCl with KCl 20 mEq/L Stopped (01/13/17 1600)  . lactated ringers Stopped (01/13/17 1600)  . methocarbamol (ROBAXIN)  IV Stopped (01/13/17 1600)    PRN Meds: acetaminophen **OR** acetaminophen, albuterol, ALPRAZolam, HYDROmorphone (DILAUDID) injection, hydrOXYzine, metoCLOPramide **OR** metoCLOPramide (REGLAN) injection, morphine injection, naloxone **AND** sodium chloride flush, ondansetron **OR** ondansetron (ZOFRAN) IV, oxyCODONE, oxyCODONE  Physical Exam         Elderly lady resting in bed She is in no distress Eyes closed resting peacefully Regular S1 S2 Abdomen  soft No edema  Vital Signs: BP 121/60 (BP Location: Left Arm)   Pulse 87   Temp 98.9 F (37.2 C) (Oral)   Resp 16   Ht 5\' 4"  (1.626 m)   Wt 80.7 kg (178 lb)   SpO2 92%   BMI 30.55 kg/m  SpO2: SpO2: 92 % O2 Device: O2 Device: Nasal Cannula O2 Flow Rate: O2 Flow Rate (L/min): 1 L/min  Intake/output summary:   Intake/Output Summary (Last 24 hours) at 01/16/2017 1144 Last data filed at 01/16/2017 0445 Gross per 24 hour  Intake 4804.05 ml  Output 900 ml  Net 3904.05 ml   LBM: Last BM Date: (PTA) Baseline Weight: Weight: 80.7 kg (178 lb) Most recent weight: Weight: 80.7 kg (178 lb)       Palliative Assessment/Data: PPS 30%   Flowsheet Rows     Most Recent Value  Intake Tab  Referral Department  Hospitalist  Unit at Time of Referral  Oncology Unit  Palliative Care Primary Diagnosis  Cancer  Palliative Care Type  New Palliative care  Reason for referral  Pain  Date first seen by Palliative Care  01/15/17  Clinical Assessment  Palliative Performance Scale Score  30%  Pain Max last 24 hours  6  Pain Min Last 24 hours  4  Dyspnea Max Last 24 Hours  3  Dyspnea Min Last 24 hours  2  Nausea Max Last 24 Hours  3  Nausea Min Last 24 Hours  2  Anxiety Max Last 24 Hours  4  Anxiety Min Last 24 Hours  3  Psychosocial & Spiritual Assessment  Palliative Care Outcomes  Patient/Family meeting held?  Yes  Who was at the meeting?  patient, son [name] who is HCPOA agent.       Patient Active Problem List   Diagnosis Date Noted  . Encounter for palliative care   . Goals of care, counseling/discussion   . Neoplasm of bone, malignant ([place]) 01/13/2017  . Femur fracture ([place]) 01/12/2017  . Pathological fracture in neoplastic disease, left femur, initial encounter for fracture ([place])   . CKD (chronic kidney disease) stage 3, GFR 30-59 ml/min (HCC) 08/24/2016  . Osteopenia determined by x-ray   . Generalized anxiety disorder 07/27/2015  . Depression and anxiety 12/02/2014  .  Shortness of breath 07/01/2014  . PVD (peripheral vascular disease) ([place]) 05/02/2014  . Lower extremity edema 04/30/2014  . Varicose veins of lower extremities with other complications 07/10/7626  . Cerebrovascular disease 08/20/2010  . Hyperlipemia 10/15/2006  . MIGRAINE HEADACHE 10/15/2006  . Essential hypertension 10/15/2006  . COPD (chronic obstructive pulmonary disease) with emphysema ([place]) 10/15/2006    Palliative Care Assessment & Plan   Patient Profile:    Assessment:  recent L femur fracture s/p repair Recent diagnosis of cancer of bone, deemed to have lung primary, saw oncologist at [place] last month HTN COPD Has depression anxiety and generalized anxiety disorder Uncontrolled pain Existential suffering Possibly entering into the dying process Stage III CKD  Recommendations/Plan:   await radiation oncology input  Will d/c PCA as patient is not able to use it. Will switch out to Dilaudid infusion at 0.3 mg per hour, she is already on IV PRN Dilaudid 0.5 mg Q 2-3 hours PRN, to continue to same  Increased dose of PO PRN Xanax as per my discussions with patient's son [name].   Son [name] states, "this is my mother's worst nightmare. She would not want to lay like this in a hospital.' I discussed frankly with him about comfort care measures, encouraged hm to notify family who lives locally, have told him that there is a hospital chaplain who can come in and offer more support. He will let us know if he wants to see a chaplain.   Continue current mode of care, PMT will continue to follow, help address symptom management needs and continue to drive overall goals of care conversations. All of the son's questions and concerns addressed to the best of my ability.     Code Status:    Code Status  Orders  (From admission, onward)        Start     Ordered   01/15/17 1017  Do not attempt resuscitation (DNR)  Continuous    Question Answer Comment  In the event of cardiac or  respiratory ARREST Do not call a "code blue"   In the event of cardiac or respiratory ARREST Do not perform Intubation, CPR, defibrillation or ACLS   In the event of cardiac or respiratory ARREST Use medication by any route, position, wound care, and other measures to relive pain and suffering. May use oxygen, suction and manual treatment of airway obstruction as needed for comfort.      01/15/17 1020    Code Status History    Date Active Date Inactive Code Status Order ID Comments User Context   01/12/2017 18:16 01/15/2017 10:20 Full Code [id number]  Wylene Simmer, MD Inpatient   01/12/2017 18:16 01/12/2017 18:16 DNR [id number]  Phillips Grout, MD Inpatient    Advance Directive Documentation     Most Recent Value  Type of Advance Directive  Out of facility DNR (pink MOST or yellow form)  Pre-existing out of facility DNR order (yellow form or pink MOST form)  No data  "MOST" Form in Place?  No data       Prognosis:   guarded   Discharge Planning:  Anticipated hospital death versus transfer to residential hospice, to be determined, based on patient's trajectory.   Care plan was discussed with son Vaughan Sine agent.   Thank you for allowing the Palliative Medicine Team to assist in the care of this patient.   Time In: 11 Time Out: 11.45 Total Time 45 Prolonged Time Billed  no       Greater than 50%  of this time was spent counseling and coordinating care related to the above assessment and plan.  Loistine Chance, MD [phone]  Please contact Palliative Medicine Team phone at [phone] for questions and concerns.

## 2017-01-16 NOTE — Progress Notes (Signed)
PT Cancellation Note  Patient Details Name: Melissa Ferrell MRN: [id number] DOB: 1935-08-06   Cancelled Treatment:    Reason Eval/Treat Not Completed: Pain limiting ability to participate, RN starting PCA fro pain control and reports that repositioning patient is bed earlier was quite painful. Will check back another time when pain is more controlled. This Probation officer did assist [patient 12/29 with limited mobility with  considerable pain involved.   Lynniah, Janoski 01/16/2017, 1:00 PM  Tresa Endo PT [phone]

## 2017-01-17 DIAGNOSIS — Z7189 Other specified counseling: Secondary | ICD-10-CM

## 2017-01-17 DIAGNOSIS — G893 Neoplasm related pain (acute) (chronic): Secondary | ICD-10-CM

## 2017-01-17 DIAGNOSIS — Z515 Encounter for palliative care: Secondary | ICD-10-CM

## 2017-01-17 MED ORDER — HYDROMORPHONE BOLUS VIA INFUSION
0.5000 mg | INTRAVENOUS | Status: DC | PRN
  Administered 2017-01-17 – 2017-01-18 (×3): 0.5 mg via INTRAVENOUS
  Filled 2017-01-17: qty 1

## 2017-01-17 NOTE — Progress Notes (Signed)
CSW following for disposition. Spoke with son this morning (attempted to check in with pt however she was moaning and unable to converse. Did ask that CSW call son "[name], he just left.'') Son expressed that pt had hospice services at home [facility] independent living) however they revoked hospice 01/15/17 "thinking that we could have her go to rehab and also possibly get radiation. Seeing her now, we are wondering if that was a mistake, we have concerns she would not be able to do therapy in this state." Son explains he and family "need to understand more about her prognosis and then we can make decisions." CSW will follow.  Sharren Bridge, MSW, LCSW Clinical Social Work 01/17/2017 [phone]

## 2017-01-17 NOTE — Progress Notes (Deleted)
Chaplain paged per family request.

## 2017-01-17 NOTE — Consult Note (Signed)
I spoke with the palliative care team and with the patient's son today. He requested that I not disturb his mother due to her confusion. In reviewing her case and also discussing with pathology, preliminary findings from her recent surgery to surgically pin her femur, she has a metastatic carcinoma. The origin will be determined by additional IHC that is being run on her slides. We anticipate final results Wednesday. Dr. Rowe Pavy is concerned that the patient's status has rapidly declined over the past two days, and that her prognosis is quite guarded. If she improves in the next 48 hours, we may consider palliative radiotherapy to the hip/sacrum, however if her prognosis is days-weeks, we do not anticipate a significant benefit in her pain, and rather would rely on our palliative colleagues for pain management recommendations, with intention to resume hospice. If she has a prognosis that matches months, we would consider palliative radiotherapy. Dr. Tammi Klippel has been a part of this conversation and is in agreement with this plan.     Carola Rhine, PAC

## 2017-01-17 NOTE — Progress Notes (Signed)
PROGRESS NOTE    Melissa Ferrell  GOT:[id number] DOB: 10/09/1935 DOA: 01/12/2017 PCP: Girtha Rm, NP-C   Brief Narrative:  Patient is Melissa Ferrell 82 year old female with past medical history of recently diagnosed malignancy of bone of unknown type, CAD, RA, hypertension, COPD, CKD who was brought to the emergency department after slipping off her chair and injuring her leg.  She was just placed on hospice supportive care for pain control.  Patient was found to have [place] left distal femoral fracture on presentation.  Underwent intervention by orthopedics.  Assessment & Plan:   Principal Problem:   Femur fracture ([place]) Active Problems:   Essential hypertension   COPD (chronic obstructive pulmonary disease) with emphysema (HCC)   Depression and anxiety   Generalized anxiety disorder   CKD (chronic kidney disease) stage 3, GFR 30-59 ml/min (HCC)   Neoplasm of bone, malignant ([place])   Encounter for palliative care   Goals of care, counseling/discussion  Left Femur fracture - Likely pathological fracture with metastatic bone tumor.  Status post intramedullary retrograde femoral nailing on the left. - Ortho following - D/C to SNF once pain controlled - Continue pain management efforts: now on dilaudid PCA but she is continuing to experience the pain  - appreciate palliative care assistance  Malignant neoplasm of bone: - Hospice is following - Rad onc consulted for palliative RT  Essential hypertension - Continue Coreg   COPD - Stable resp status  Depression/anxiety/generalized anxiety disorder - More restless this am due to itching likely from dilaudid PCA  CKD stage III - Cr now WNL  DVT prophylaxis: lovenox Code Status: DNR Family Communication: none at bedside Disposition Plan: pending   Consultants:   Ortho  Palliative  Rad Onc  Procedures: (Don't include imaging studies which can be auto populated. Include things that cannot be auto populated i.e. Echo,  Carotid and venous dopplers, Foley, Bipap, HD, tubes/drains, wound vac, central lines etc)  Intramedullary retrograde femoral nailing on the left   Antimicrobials: (specify start and planned stop date. Auto populated tables are space occupying and do not give end dates) Anti-infectives (From admission, onward)   Start     Dose/Rate Route Frequency Ordered Stop   01/13/17 0600  clindamycin (CLEOCIN) IVPB 900 mg     900 mg 100 mL/hr over 30 Minutes Intravenous On call to O.R. 01/12/17 1434 01/12/17 1600   01/12/17 1439  clindamycin (CLEOCIN) 900 MG/50ML IVPB    Comments:  Harvell, Gwendolyn  : cabinet override      01/12/17 1439 01/13/17 0244       Subjective: C/o pain.  Otherwise, not many other complaints.  Objective: Vitals:   01/17/17 0448 01/17/17 0516 01/17/17 0908 01/17/17 0911  BP: (!) 168/100 (!) 154/89    Pulse: 79 (!) 101    Resp: 20 18    Temp: 98 F (36.7 C)     TempSrc: Oral     SpO2: 97% 98% (!) 88% (!) 88%  Weight:      Height:        Intake/Output Summary (Last 24 hours) at 01/17/2017 1116 Last data filed at 01/17/2017 0800 Gross per 24 hour  Intake 3255.24 ml  Output 2550 ml  Net 705.24 ml   Filed Weights   01/12/17 1452  Weight: 80.7 kg (178 lb)    Examination:  General exam: Appears calm and comfortable. Lawyer Washabaugh&Ox2-3(knew hospital, but not [facility])  Respiratory system: Clear to auscultation. Respiratory effort normal. Cardiovascular system: S1 & S2 heard, RRR. No JVD,  murmurs, rubs, gallops or clicks.. Gastrointestinal system: Abdomen is nondistended, soft and nontender. No organomegaly or masses felt. Normal bowel sounds heard. Central nervous system: Alert and oriented. No focal neurological deficits. Extremities: LLE wrapped and elevated Skin: No rashes, lesions or ulcers Psychiatry: Judgement and insight appear normal. Mood & affect appropriate.     Data Reviewed: I have personally reviewed following labs and imaging studies  CBC: Recent Labs    Lab 25-Jan-2017 0730 01/13/17 0337 01/14/17 0353 01/15/17 0730 01/16/17 0408  WBC 8.9 8.4 9.0 8.9 6.3  NEUTROABS  --   --  7.2  --   --   HGB 12.3 10.0* 9.8* 10.2* 9.3*  HCT 37.7 30.8* 29.5* 31.0* 27.9*  MCV 96.4 96.9 96.1 93.9 95.5  PLT 253 223 185 219 229   Basic Metabolic Panel: Recent Labs  Lab Jan 25, 2017 0730 01/13/17 0337 01/15/17 0730 01/16/17 0408  NA 142 137 136 136  K 4.2 3.5 3.5 3.5  CL 106 103 104 105  CO2 28 28 25 26   GLUCOSE 120* 133* 110* 94  BUN 15 16 12 13   CREATININE 0.90 0.94 0.78 0.82  CALCIUM 9.7 8.9 8.6* 8.8*   GFR: Estimated Creatinine Clearance: 57.2 mL/min (by C-G formula based on SCr of 0.82 mg/dL). Liver Function Tests: No results for input(s): AST, ALT, ALKPHOS, BILITOT, PROT, ALBUMIN in the last 168 hours. No results for input(s): LIPASE, AMYLASE in the last 168 hours. No results for input(s): AMMONIA in the last 168 hours. Coagulation Profile: No results for input(s): INR, PROTIME in the last 168 hours. Cardiac Enzymes: No results for input(s): CKTOTAL, CKMB, CKMBINDEX, TROPONINI in the last 168 hours. BNP (last 3 results) No results for input(s): PROBNP in the last 8760 hours. HbA1C: No results for input(s): HGBA1C in the last 72 hours. CBG: No results for input(s): GLUCAP in the last 168 hours. Lipid Profile: No results for input(s): CHOL, HDL, LDLCALC, TRIG, CHOLHDL, LDLDIRECT in the last 72 hours. Thyroid Function Tests: No results for input(s): TSH, T4TOTAL, FREET4, T3FREE, THYROIDAB in the last 72 hours. Anemia Panel: No results for input(s): VITAMINB12, FOLATE, FERRITIN, TIBC, IRON, RETICCTPCT in the last 72 hours. Sepsis Labs: No results for input(s): PROCALCITON, LATICACIDVEN in the last 168 hours.  No results found for this or any previous visit (from the past 240 hour(s)).       Radiology Studies: No results found.      Scheduled Meds: . acetaminophen  1,000 mg Oral TID  . carvedilol  25 mg Oral BID WC  .  chlorhexidine  60 mL Topical Once  . enoxaparin (LOVENOX) injection  40 mg Subcutaneous Q24H  . gabapentin  200 mg Oral BID  . gabapentin  300 mg Oral QHS  . lidocaine  1 patch Transdermal Q24H  . tiotropium  18 mcg Inhalation Daily   Continuous Infusions: . sodium chloride 75 mL/hr at 01/17/17 0302  . dextrose 5 % and 0.45 % NaCl with KCl 20 mEq/L Stopped (01/13/17 1600)  . HYDROmorphone 0.3 mg/hr (01/16/17 1251)  . lactated ringers Stopped (01/13/17 1600)  . methocarbamol (ROBAXIN)  IV Stopped (01/13/17 1600)     LOS: 5 days    Time spent: over [place], MD [facility] Pager [phone]  If 7PM-7AM, please contact night-coverage [email or web address] Password [email or web address] 01/17/2017, 11:16 AM

## 2017-01-17 NOTE — Progress Notes (Signed)
Daily Progress Note   Patient Name: Melissa Ferrell       Date: 01/17/2017 DOB: [birth year]  Age: 82 y.o. MRN#: [id number] Attending Physician: Melissa Ferrell, Melissa Grief, MD Primary Care Physician: Melissa Rm, NP-C Admit Date: 01/12/2017  Reason for Consultation/Follow-up: Establishing goals of care  Subjective: Patient is resting in bed, she is on basal rate Dilaudid PCA.  Has received different medications for rescue overnight.  Total oral morphine equivalent over the last 24 hours for all of her opioids is roughly 200mg  oral morphine. Reports pain is still not ideal, but better than yesterday.  Reports wanting to rest. I called and discussed with her son via phone. Appreciate rad onc input See below  Length of Stay: 5  Current Medications: Scheduled Meds:  . acetaminophen  1,000 mg Oral TID  . carvedilol  25 mg Oral BID WC  . chlorhexidine  60 mL Topical Once  . enoxaparin (LOVENOX) injection  40 mg Subcutaneous Q24H  . gabapentin  200 mg Oral BID  . gabapentin  300 mg Oral QHS  . lidocaine  1 patch Transdermal Q24H  . tiotropium  18 mcg Inhalation Daily    Continuous Infusions: . sodium chloride 75 mL/hr at 01/17/17 0302  . dextrose 5 % and 0.45 % NaCl with KCl 20 mEq/L Stopped (01/13/17 1600)  . HYDROmorphone 0.3 mg/hr (01/16/17 1251)  . lactated ringers Stopped (01/13/17 1600)  . methocarbamol (ROBAXIN)  IV Stopped (01/13/17 1600)    PRN Meds: acetaminophen **OR** acetaminophen, albuterol, ALPRAZolam, HYDROmorphone (DILAUDID) injection, hydrOXYzine, metoCLOPramide **OR** metoCLOPramide (REGLAN) injection, morphine injection, ondansetron **OR** ondansetron (ZOFRAN) IV, oxyCODONE, oxyCODONE  Physical Exam         Elderly lady resting in bed She is in no distress Eyes  closed resting peacefully Answers questions appropriately. Regular S1 S2 Abdomen soft No edema  Vital Signs: BP (!) 154/89 (BP Location: Left Arm)   Pulse (!) 101   Temp 98 F (36.7 C) (Oral)   Resp 18   Ht 5\' 4"  (1.626 m)   Wt 80.7 kg (178 lb)   SpO2 (!) 88%   BMI 30.55 kg/m  SpO2: SpO2: (!) 88 % O2 Device: O2 Device: Not Delivered O2 Flow Rate: O2 Flow Rate (L/min): 1 L/min  Intake/output summary:   Intake/Output Summary (Last 24 hours) at 01/17/2017 1108  Last data filed at 01/17/2017 0800 Gross per 24 hour  Intake 3255.24 ml  Output 2550 ml  Net 705.24 ml   LBM: Last BM Date: 01/16/17 Baseline Weight: Weight: 80.7 kg (178 lb) Most recent weight: Weight: 80.7 kg (178 lb)       Palliative Assessment/Data: PPS 30%   Flowsheet Rows     Most Recent Value  Intake Tab  Referral Department  Hospitalist  Unit at Time of Referral  Oncology Unit  Palliative Care Primary Diagnosis  Cancer  Date Notified  01/14/17  Palliative Care Type  New Palliative care  Reason for referral  Pain  Date of Admission  01/12/17  Date first seen by Palliative Care  01/15/17  # of days Palliative referral response time  1 Day(s)  # of days IP prior to Palliative referral  2  Clinical Assessment  Palliative Performance Scale Score  30%  Pain Max last 24 hours  6  Pain Min Last 24 hours  4  Dyspnea Max Last 24 Hours  3  Dyspnea Min Last 24 hours  2  Nausea Max Last 24 Hours  3  Nausea Min Last 24 Hours  2  Anxiety Max Last 24 Hours  4  Anxiety Min Last 24 Hours  3  Psychosocial & Spiritual Assessment  Palliative Care Outcomes  Patient/Family meeting held?  Yes  Who was at the meeting?  patient, son Melissa Ferrell who is HCPOA agent.       Patient Active Problem List   Diagnosis Date Noted  . Encounter for palliative care   . Goals of care, counseling/discussion   . Neoplasm of bone, malignant ([place]) 01/13/2017  . Femur fracture ([place]) 01/12/2017  . Pathological fracture in neoplastic  disease, left femur, initial encounter for fracture ([place])   . CKD (chronic kidney disease) stage 3, GFR 30-59 ml/min (HCC) 08/24/2016  . Osteopenia determined by x-ray   . Generalized anxiety disorder 07/27/2015  . Depression and anxiety 12/02/2014  . Shortness of breath 07/01/2014  . PVD (peripheral vascular disease) ([place]) 05/02/2014  . Lower extremity edema 04/30/2014  . Varicose veins of lower extremities with other complications 52/84/1324  . Cerebrovascular disease 08/20/2010  . Hyperlipemia 10/15/2006  . MIGRAINE HEADACHE 10/15/2006  . Essential hypertension 10/15/2006  . COPD (chronic obstructive pulmonary disease) with emphysema ([place]) 10/15/2006    Palliative Care Assessment & Plan   Patient Profile:    Assessment:  recent L femur fracture s/p repair Recent diagnosis of cancer of bone, deemed to have lung primary, saw oncologist at [place] last month HTN COPD Has depression anxiety and generalized anxiety disorder Uncontrolled pain Existential suffering Possibly entering into the dying process Stage III CKD  Recommendations/Plan:  Radiation Oncology to evaluate tomorrow to help with determination if radiation will add quality to life at this point.  Continue Dilaudid infusion at 0.3 mg per hour, she has been receiving multiple prn medications (Oxycodone, morphine, dilaudid).  Will continue IV PRN Dilaudid 0.5 mg Q 2 hours PRN and d/c other prn medications.  She has been complaining of itching, but unclear if any particular medication is contributing to this.  Increased dose of PO PRN Xanax as per my discussions with patient's son Melissa Ferrell.   Continue current mode of care, PMT will continue to follow, help address symptom management needs and continue to drive overall goals of care conversations. All of the son's questions and concerns addressed to the best of my ability. We are planning to discuss again tomorrow morning.  Code Status:    Code Status Orders  (From  admission, onward)        Start     Ordered   01/15/17 1017  Do not attempt resuscitation (DNR)  Continuous    Question Answer Comment  In the event of cardiac or respiratory ARREST Do not call a "code blue"   In the event of cardiac or respiratory ARREST Do not perform Intubation, CPR, defibrillation or ACLS   In the event of cardiac or respiratory ARREST Use medication by any route, position, wound care, and other measures to relive pain and suffering. May use oxygen, suction and manual treatment of airway obstruction as needed for comfort.      01/15/17 1020    Code Status History    Date Active Date Inactive Code Status Order ID Comments User Context   01/12/2017 18:16 01/15/2017 10:20 Full Code [id number]  Wylene Simmer, MD Inpatient   01/12/2017 18:16 01/12/2017 18:16 DNR [id number]  Phillips Grout, MD Inpatient    Advance Directive Documentation     Most Recent Value  Type of Advance Directive  Out of facility DNR (pink MOST or yellow form)  Pre-existing out of facility DNR order (yellow form or pink MOST form)  No data  "MOST" Form in Place?  No data       Prognosis:   guarded   Discharge Planning:  To be determined, based on patient's trajectory.   Care plan was discussed with son Vaughan Sine agent.   Thank you for allowing the Palliative Medicine Team to assist in the care of this patient.   Time In: 1005 Time Out: 1045 Total Time 40 Prolonged Time Billed  no       Greater than 50%  of this time was spent counseling and coordinating care related to the above assessment and plan.  Micheline Rough, MD [facility] Team [phone]  Please contact Palliative Medicine Team phone at [phone] for questions and concerns.

## 2017-01-18 DIAGNOSIS — N183 Chronic kidney disease, stage 3 (moderate): Secondary | ICD-10-CM

## 2017-01-18 DIAGNOSIS — S728X2A Other fracture of left femur, initial encounter for closed fracture: Secondary | ICD-10-CM

## 2017-01-18 DIAGNOSIS — I10 Essential (primary) hypertension: Secondary | ICD-10-CM

## 2017-01-18 LAB — CBC
HCT: 29.9 % — ABNORMAL LOW (ref 36.0–46.0)
HEMOGLOBIN: 9.8 g/dL — AB (ref 12.0–15.0)
MCH: 30.8 pg (ref 26.0–34.0)
MCHC: 32.8 g/dL (ref 30.0–36.0)
MCV: 94 fL (ref 78.0–100.0)
PLATELETS: 269 10*3/uL (ref 150–400)
RBC: 3.18 MIL/uL — AB (ref 3.87–5.11)
RDW: 13.5 % (ref 11.5–15.5)
WBC: 7.5 10*3/uL (ref 4.0–10.5)

## 2017-01-18 LAB — BASIC METABOLIC PANEL
Anion gap: 5 (ref 5–15)
BUN: 14 mg/dL (ref 6–20)
CHLORIDE: 105 mmol/L (ref 101–111)
CO2: 26 mmol/L (ref 22–32)
Calcium: 8.8 mg/dL — ABNORMAL LOW (ref 8.9–10.3)
Creatinine, Ser: 0.9 mg/dL (ref 0.44–1.00)
GFR, EST NON AFRICAN AMERICAN: 59 mL/min — AB (ref >60)
Glucose, Bld: 96 mg/dL (ref 65–99)
POTASSIUM: 3.5 mmol/L (ref 3.5–5.1)
SODIUM: 136 mmol/L (ref 135–145)

## 2017-01-18 MED ORDER — ALPRAZOLAM 0.25 MG PO TABS
0.2500 mg | ORAL_TABLET | Freq: Three times a day (TID) | ORAL | Status: DC
  Administered 2017-01-18 – 2017-01-20 (×8): 0.25 mg via ORAL
  Filled 2017-01-18 (×8): qty 1

## 2017-01-18 NOTE — Progress Notes (Signed)
Patient has pulled IV out IV team notified

## 2017-01-18 NOTE — Progress Notes (Signed)
I spoke with Pathology today and we reviewed the findings of metastatic lung cancer. I also have spoken with the palliative care team as well as [name], the patient's son. At this point, her prognosis is guarded and we agree as a group that radiotherapy at this time could be more invasive in terms of pain inducing from the mechanics of moving her to the table for treatment, and that the benefit may be limited as well given the concerns for her prognosis. We could certainly consider radiotherapy if her prognosis were to change, and revisit this discussion as needed. Otherwise we will sign off. Please feel free to reach out to me if re-consult is appropriate.    Carola Rhine, PAC

## 2017-01-18 NOTE — Progress Notes (Signed)
PT Cancellation Note  Patient Details Name: Melissa Ferrell MRN: [id number] DOB: 01/01/1936   Cancelled Treatment:     PT deferred this date after discussion with RN.  Pt resting with min pain at this time but with increased confusion.  Will follow.   Suad Autrey 01/18/2017, 1:24 PM

## 2017-01-18 NOTE — Progress Notes (Signed)
Daily Progress Note   Patient Name: Melissa Ferrell       Date: 01/18/2017 DOB: 1935/01/18  Age: 82 y.o. MRN#: [id number] Attending Physician: Patrecia Pour, Christean Grief, MD Primary Care Physician: Girtha Rm, NP-C Admit Date: 01/12/2017  Reason for Consultation/Follow-up: Establishing goals of care  Subjective: Patient is resting in bed, she is on basal rate Dilaudid PCA.    Reports pain is currently well controlled and certainly better than yesterday.  She has had once bolus dose dilaudid since yesterday.  Her sons are present in her room and she mistakes them for her brothers at first.  She does acknowledge they are in fact her sons when 1 of them corrects her.  I met with her sons outside of her room and we discussed her clinical course over the last several days.  We also discussed options moving forward for her care including continuing to pursue possibility of radiation and rehab versus re-engaging hospice.  See below  Length of Stay: 6  Current Medications: Scheduled Meds:  . acetaminophen  1,000 mg Oral TID  . ALPRAZolam  0.25 mg Oral TID  . carvedilol  25 mg Oral BID WC  . chlorhexidine  60 mL Topical Once  . enoxaparin (LOVENOX) injection  40 mg Subcutaneous Q24H  . gabapentin  200 mg Oral BID  . gabapentin  300 mg Oral QHS  . lidocaine  1 patch Transdermal Q24H  . tiotropium  18 mcg Inhalation Daily    Continuous Infusions: . sodium chloride 75 mL/hr at 01/18/17 0516  . HYDROmorphone 0.3 mg/hr (01/16/17 1251)  . lactated ringers Stopped (01/13/17 1600)  . methocarbamol (ROBAXIN)  IV Stopped (01/13/17 1600)    PRN Meds: acetaminophen **OR** acetaminophen, albuterol, HYDROmorphone, hydrOXYzine, metoCLOPramide **OR** metoCLOPramide (REGLAN) injection, ondansetron  **OR** ondansetron (ZOFRAN) IV  Physical Exam         Elderly lady resting in bed She is in no distress Eyes closed resting peacefully Pleasantly confused Regular S1 S2 Abdomen soft No edema  Vital Signs: BP 116/76 (BP Location: Left Arm)   Pulse 72   Temp 98.4 F (36.9 C) (Oral)   Resp 16   Ht _0  (1.626 m)   Wt 80.7 kg (178 lb)   SpO2 96%   BMI 30.55 kg/m  SpO2: SpO2: 96 % O2 Device: O2  Device: Nasal Cannula O2 Flow Rate: O2 Flow Rate (L/min): 2 L/min  Intake/output summary:   Intake/Output Summary (Last 24 hours) at 01/18/2017 0946 Last data filed at 01/18/2017 6440 Gross per 24 hour  Intake 1512.04 ml  Output 700 ml  Net 812.04 ml   LBM: Last BM Date: 01/16/17 Baseline Weight: Weight: 80.7 kg (178 lb) Most recent weight: Weight: 80.7 kg (178 lb)       Palliative Assessment/Data: PPS 30%   Flowsheet Rows     Most Recent Value  Intake Tab  Referral Department  Hospitalist  Unit at Time of Referral  Oncology Unit  Palliative Care Primary Diagnosis  Cancer  Date Notified  01/14/17  Palliative Care Type  New Palliative care  Reason for referral  Pain  Date of Admission  01/12/17  Date first seen by Palliative Care  01/15/17  # of days Palliative referral response time  1 Day(s)  # of days IP prior to Palliative referral  2  Clinical Assessment  Palliative Performance Scale Score  30%  Pain Max last 24 hours  6  Pain Min Last 24 hours  4  Dyspnea Max Last 24 Hours  3  Dyspnea Min Last 24 hours  2  Nausea Max Last 24 Hours  3  Nausea Min Last 24 Hours  2  Anxiety Max Last 24 Hours  4  Anxiety Min Last 24 Hours  3  Psychosocial & Spiritual Assessment  Palliative Care Outcomes  Patient/Family meeting held?  Yes  Who was at the meeting?  patient, son [name] who is HCPOA agent.       Patient Active Problem List   Diagnosis Date Noted  . Encounter for palliative care   . Goals of care, counseling/discussion   . Neoplasm of bone, malignant ([place])  01/13/2017  . Femur fracture ([place]) 01/12/2017  . Pathological fracture in neoplastic disease, left femur, initial encounter for fracture ([place])   . CKD (chronic kidney disease) stage 3, GFR 30-59 ml/min (HCC) 08/24/2016  . Osteopenia determined by x-ray   . Generalized anxiety disorder 07/27/2015  . Depression and anxiety 12/02/2014  . Shortness of breath 07/01/2014  . PVD (peripheral vascular disease) ([place]) 05/02/2014  . Lower extremity edema 04/30/2014  . Varicose veins of lower extremities with other complications 34/74/2595  . Cerebrovascular disease 08/20/2010  . Hyperlipemia 10/15/2006  . MIGRAINE HEADACHE 10/15/2006  . Essential hypertension 10/15/2006  . COPD (chronic obstructive pulmonary disease) with emphysema ([place]) 10/15/2006    Palliative Care Assessment & Plan   Patient Profile:    Assessment:  recent L femur fracture s/p repair Recent diagnosis of cancer of bone, deemed to have lung primary, saw oncologist at [facility] last month HTN COPD Has depression anxiety and generalized anxiety disorder Uncontrolled pain Existential suffering Possibly entering into the dying process Stage III CKD  Recommendations/Plan:  Continue Dilaudid infusion at 0.3 mg per hour. Continue IV PRN Dilaudid 0.5 mg Q 2 hours PRN and d/c other prn medications.  She has been complaining of itching. Continue antihistamine as needed.  It is still unclear to me if this is from dilaudid or possibly from other medications she was receiving.  She continues to have periods of anxiety. Plan to schedule low dose Xanax per request by family.   Her sons are now thinking that she is not going to gain significant benefit from pursuing radiation therapy.  She has only had 3 small bites of food in the last 24 hours, and I anticipate  her prognosis to be weeks at best.  I will touch base with radiation oncology to let them know about my conversation this morning with family that family is likely going to forego  any attempts at radiation.  Patient was on hospice at time of admission.  I will reach out again to hospice per family request in order to discuss options for hospice care outside of the hospital.   Code Status:    Code Status Orders  (From admission, onward)        Start     Ordered   01/15/17 1017  Do not attempt resuscitation (DNR)  Continuous    Question Answer Comment  In the event of cardiac or respiratory ARREST Do not call a "code blue"   In the event of cardiac or respiratory ARREST Do not perform Intubation, CPR, defibrillation or ACLS   In the event of cardiac or respiratory ARREST Use medication by any route, position, wound care, and other measures to relive pain and suffering. May use oxygen, suction and manual treatment of airway obstruction as needed for comfort.      01/15/17 1020    Code Status History    Date Active Date Inactive Code Status Order ID Comments User Context   01/12/2017 18:16 01/15/2017 10:20 Full Code [id number]  Wylene Simmer, MD Inpatient   01/12/2017 18:16 01/12/2017 18:16 DNR [id number]  Phillips Grout, MD Inpatient    Advance Directive Documentation     Most Recent Value  Type of Advance Directive  Out of facility DNR (pink MOST or yellow form)  Pre-existing out of facility DNR order (yellow form or pink MOST form)  No data  "MOST" Form in Place?  No data       Prognosis:   guarded   Discharge Planning:  To be determined, based on patient's trajectory.   Care plan was discussed with sons, including Physicist, medical.   Thank you for allowing the Palliative Medicine Team to assist in the care of this patient.   Time In: 0730 Time Out: 0815 Total Time 45 Prolonged Time Billed  no       Greater than 50%  of this time was spent counseling and coordinating care related to the above assessment and plan.  Micheline Rough, MD [facility] Team [phone]  Please contact Palliative Medicine Team phone at [phone]  for questions and concerns.

## 2017-01-18 NOTE — Progress Notes (Signed)
PROGRESS NOTE [facility]   Melissa Ferrell   ZOX:[id number] DOB: 22-Jun-1935  DOA: 01/12/2017 PCP: Girtha Rm, NP-C   Brief Narrative:  Melissa Ferrell is a 82 year old female with past medical history of recently diagnosed malignancy of bone of unknown type, CAD, RA, hypertension, COPD, CKD who was brought to the emergency department after slipping off her chair and injuring her leg. She was just placed on hospice supportive care for pain control. Patient was found to have a left distal femoral fracture on presentation. Underwent intervention by orthopedics.  Subjective: Patient seen and examined, report her pain is well controlled, although per nursing staff she seems to be more confuse. Denies SOB and chest pain.   Assessment & Plan:   Principal Problem:   Femur fracture ([place]) Active Problems:   Essential hypertension   COPD (chronic obstructive pulmonary disease) with emphysema (HCC)   Depression and anxiety   Generalized anxiety disorder   CKD (chronic kidney disease) stage 3, GFR 30-59 ml/min (HCC)   Neoplasm of bone, malignant ([place])   Encounter for palliative care   Goals of care, counseling/discussion  Left Femur fracture - Likely pathological fracture with metastatic bone tumor. Status post intramedullary retrograde femoral nailing on the left. - Ortho following - Palliative care appreciated, help with pain control. Although patient now somewhat confused, will d/c Dilaudid PRN boluses, will leave only infusion. D/c gabapentin, continue to monitor if no signs of improvement will get CT head  - d/c to SNF for rehab vs Hospice   Malignant neoplasm of bone: - Poor prognosis  - Rad oncology consulted and did not recommended radiation therapy, risk outweigh the benefits  - Pain control, patient may need residential hospice   Essential hypertension - BP stable  - Continue Coreg  COPD - Stable  - Monitor resp status closely as patient is on  continues opiates   CKD stage III - Cr at baseline   DVT prophylaxis: Lovenox  Code Status: DNR  Family Communication: None at bedside  Disposition Plan: Residential hospice vs SNF with Hospice   Consultants:   Ortho   Palliative   Rad Onc  Procedures:   Intramedullary retrograde femoral nailing on the left   Antimicrobials: Anti-infectives (From admission, onward)   Start     Dose/Rate Route Frequency Ordered Stop   01/13/17 0600  clindamycin (CLEOCIN) IVPB 900 mg     900 mg 100 mL/hr over 30 Minutes Intravenous On call to O.R. 01/12/17 1434 01/12/17 1600   01/12/17 1439  clindamycin (CLEOCIN) 900 MG/50ML IVPB    Comments:  Ferrell, Melissa  : cabinet override      01/12/17 1439 01/13/17 0244       Objective: Vitals:   01/18/17 0505 01/18/17 0942 01/18/17 1208 01/18/17 1351  BP: 116/76   (!) 150/63  Pulse: 72   71  Resp: 16   20  Temp: 98.4 F (36.9 C)   98.4 F (36.9 C)  TempSrc: Oral   Oral  SpO2: 96% 96% 96% 99%  Weight:      Height:        Intake/Output Summary (Last 24 hours) at 01/18/2017 1731 Last data filed at 01/18/2017 1518 Gross per 24 hour  Intake 1191.6 ml  Output 1700 ml  Net -508.4 ml   Filed Weights   01/12/17 1452  Weight: 80.7 kg (178 lb)    Examination:  General exam: Calm, sleepy  HEENT: AC/AT, PERRLA, OP moist and clear Respiratory system: Clear to auscultation. No  wheezes,crackle or rhonchi Cardiovascular system: S1 & S2 heard, RRR. No JVD, murmurs, rubs or gallops Gastrointestinal system: Abdomen is nondistended, soft and nontender. No organomegaly or masses felt. Normal bowel sounds heard. Central nervous system: Alert to place, person and time. No focal findings  Extremities: No pedal edema.  Skin: No rashes, lesions or ulcers Psychiatry: mild confusion    Data Reviewed: I have personally reviewed following labs and imaging studies  CBC: Recent Labs  Lab 01/13/17 0337 01/14/17 0353 01/15/17 0730 01/16/17 0408  01/18/17 0431  WBC 8.4 9.0 8.9 6.3 7.5  NEUTROABS  --  7.2  --   --   --   HGB 10.0* 9.8* 10.2* 9.3* 9.8*  HCT 30.8* 29.5* 31.0* 27.9* 29.9*  MCV 96.9 96.1 93.9 95.5 94.0  PLT 223 185 219 224 956   Basic Metabolic Panel: Recent Labs  Lab 01/12/17 0730 01/13/17 0337 01/15/17 0730 01/16/17 0408 01/18/17 0431  NA 142 137 136 136 136  K 4.2 3.5 3.5 3.5 3.5  CL 106 103 104 105 105  CO2 28 28 25 26 26   GLUCOSE 120* 133* 110* 94 96  BUN 15 16 12 13 14   CREATININE 0.90 0.94 0.78 0.82 0.90  CALCIUM 9.7 8.9 8.6* 8.8* 8.8*   GFR: Estimated Creatinine Clearance: 52.1 mL/min (by C-G formula based on SCr of 0.9 mg/dL). Liver Function Tests: No results for input(s): AST, ALT, ALKPHOS, BILITOT, PROT, ALBUMIN in the last 168 hours. No results for input(s): LIPASE, AMYLASE in the last 168 hours. No results for input(s): AMMONIA in the last 168 hours. Coagulation Profile: No results for input(s): INR, PROTIME in the last 168 hours. Cardiac Enzymes: No results for input(s): CKTOTAL, CKMB, CKMBINDEX, TROPONINI in the last 168 hours. BNP (last 3 results) No results for input(s): PROBNP in the last 8760 hours. HbA1C: No results for input(s): HGBA1C in the last 72 hours. CBG: No results for input(s): GLUCAP in the last 168 hours. Lipid Profile: No results for input(s): CHOL, HDL, LDLCALC, TRIG, CHOLHDL, LDLDIRECT in the last 72 hours. Thyroid Function Tests: No results for input(s): TSH, T4TOTAL, FREET4, T3FREE, THYROIDAB in the last 72 hours. Anemia Panel: No results for input(s): VITAMINB12, FOLATE, FERRITIN, TIBC, IRON, RETICCTPCT in the last 72 hours. Sepsis Labs: No results for input(s): PROCALCITON, LATICACIDVEN in the last 168 hours.  No results found for this or any previous visit (from the past 240 hour(s)).    Radiology Studies: No results found.   Scheduled Meds: . acetaminophen  1,000 mg Oral TID  . ALPRAZolam  0.25 mg Oral TID  . carvedilol  25 mg Oral BID WC  .  chlorhexidine  60 mL Topical Once  . enoxaparin (LOVENOX) injection  40 mg Subcutaneous Q24H  . gabapentin  200 mg Oral BID  . gabapentin  300 mg Oral QHS  . lidocaine  1 patch Transdermal Q24H  . tiotropium  18 mcg Inhalation Daily   Continuous Infusions: . sodium chloride 75 mL/hr at 01/18/17 0516  . HYDROmorphone 0.3 mg/hr (01/16/17 1251)  . lactated ringers Stopped (01/13/17 1600)  . methocarbamol (ROBAXIN)  IV Stopped (01/13/17 1600)     LOS: 6 days    Time spent: Total of 25 minutes spent with pt, greater than 50% of which was spent in discussion of  treatment, counseling and coordination of care   Chipper Oman, MD Pager: Text Page via [email or web address]   If 7PM-7AM, please contact night-coverage [email or web address] 01/18/2017, 5:31 PM

## 2017-01-19 ENCOUNTER — Inpatient Hospital Stay (HOSPITAL_COMMUNITY)

## 2017-01-19 ENCOUNTER — Encounter (HOSPITAL_COMMUNITY): Payer: Self-pay | Admitting: Orthopedic Surgery

## 2017-01-19 LAB — CREATININE, SERUM
Creatinine, Ser: 0.74 mg/dL (ref 0.44–1.00)
GFR calc Af Amer: >60 mL/min (ref >60)

## 2017-01-19 NOTE — Progress Notes (Signed)
Left voicemail with pt's son in order to discuss plans at DC. Anticipating likelihood of family opting to re-engage with Hospice (possibly at SNF building rather than independent living apartment at [address]). Will follow.   Sharren Bridge, MSW, LCSW Clinical Social Work 01/19/2017 [phone]

## 2017-01-19 NOTE — Progress Notes (Signed)
PROGRESS NOTE [facility]   Melissa Ferrell   KDT:[id number] DOB: 07-06-1935  DOA: 01/12/2017 PCP: Melissa Rm, NP-C   Brief Narrative:  Melissa Ferrell is a 82 year old female with past medical history of recently diagnosed malignancy of bone of unknown type, CAD, RA, hypertension, COPD, CKD who was brought to the emergency department after slipping off her chair and injuring her leg. She was just placed on hospice supportive care for pain control. Patient was found to have a left distal femoral fracture on presentation. Underwent intervention by orthopedics.  Subjective: Patient seen and examined, she continues to with delirium but remain oriented x 3. Report pain is ok for now. Patient asking to speak with her son.   Assessment & Plan:   Principal Problem:   Femur fracture ([place]) Active Problems:   Essential hypertension   COPD (chronic obstructive pulmonary disease) with emphysema (HCC)   Depression and anxiety   Generalized anxiety disorder   CKD (chronic kidney disease) stage 3, GFR 30-59 ml/min (HCC)   Neoplasm of bone, malignant ([place])   Encounter for palliative care   Goals of care, counseling/discussion  Left Femur fracture - Likely pathological fracture with metastatic bone tumor. Status post intramedullary retrograde femoral nailing on the left. - Palliative care appreciated, help with pain control.  - Now with delirium ? From pain med vs Benzos, gabapentin d/ced.  - Will obtain a CT of the head to r/o brain mets or stroke  - Patient seem to continues to decline, seem that hospice would be the appropriate option, Med oncology consulted per family request to explore all options.   Malignant neoplasm of bone: - Poor prognosis  - Rad oncology consulted and did not recommended radiation therapy, risk outweigh the benefits  - Pain control, patient may need residential hospice   Essential hypertension - BP stable  - Continue Coreg  COPD - Stable    - Monitor resp status closely as patient is on continues opiates   CKD stage III - Cr at baseline   DVT prophylaxis: Lovenox  Code Status: DNR  Family Communication: None at bedside  Disposition Plan: Residential hospice  Consultants:   Ortho   Palliative   Rad Onc  Procedures:   Intramedullary retrograde femoral nailing on the left   Antimicrobials: Anti-infectives (From admission, onward)   Start     Dose/Rate Route Frequency Ordered Stop   01/13/17 0600  clindamycin (CLEOCIN) IVPB 900 mg     900 mg 100 mL/hr over 30 Minutes Intravenous On call to O.R. 01/12/17 1434 01/12/17 1600   01/12/17 1439  clindamycin (CLEOCIN) 900 MG/50ML IVPB    Comments:  Ferrell, Melissa  : cabinet override      01/12/17 1439 01/13/17 0244      Objective: Vitals:   01/18/17 1943 01/19/17 0529 01/19/17 1052 01/19/17 1300  BP: (!) 152/79 138/82  (!) 149/78  Pulse: 69 67  70  Resp: 20 18  16   Temp: 98.1 F (36.7 C) 98.6 F (37 C)  99 F (37.2 C)  TempSrc: Oral Oral  Oral  SpO2: 97% 98% (!) 87% 95%  Weight:      Height:        Intake/Output Summary (Last 24 hours) at 01/19/2017 1630 Last data filed at 01/19/2017 1500 Gross per 24 hour  Intake 240 ml  Output 1600 ml  Net -1360 ml   Filed Weights   01/12/17 1452  Weight: 80.7 kg (178 lb)    Examination:  General: NAD  Cardiovascular: RRR, S1/S2 +, no rubs, no gallops Respiratory: CTA bilaterally, no wheezing, no rhonchi Abdominal: Soft, NT, ND, bowel sounds + Extremities: no edema Neuro: Oriented to person, year and place. Sleepy but responsive    Data Reviewed: I have personally reviewed following labs and imaging studies  CBC: Recent Labs  Lab 01/13/17 0337 01/14/17 0353 01/15/17 0730 01/16/17 0408 01/18/17 0431  WBC 8.4 9.0 8.9 6.3 7.5  NEUTROABS  --  7.2  --   --   --   HGB 10.0* 9.8* 10.2* 9.3* 9.8*  HCT 30.8* 29.5* 31.0* 27.9* 29.9*  MCV 96.9 96.1 93.9 95.5 94.0  PLT 223 185 219 224 347   Basic  Metabolic Panel: Recent Labs  Lab 01/13/17 0337 01/15/17 0730 01/16/17 0408 01/18/17 0431 01/19/17 0502  NA 137 136 136 136  --   K 3.5 3.5 3.5 3.5  --   CL 103 104 105 105  --   CO2 28 25 26 26   --   GLUCOSE 133* 110* 94 96  --   BUN 16 12 13 14   --   CREATININE 0.94 0.78 0.82 0.90 0.74  CALCIUM 8.9 8.6* 8.8* 8.8*  --    GFR: Estimated Creatinine Clearance: 58.6 mL/min (by C-G formula based on SCr of 0.74 mg/dL). Liver Function Tests: No results for input(s): AST, ALT, ALKPHOS, BILITOT, PROT, ALBUMIN in the last 168 hours. No results for input(s): LIPASE, AMYLASE in the last 168 hours. No results for input(s): AMMONIA in the last 168 hours. Coagulation Profile: No results for input(s): INR, PROTIME in the last 168 hours. Cardiac Enzymes: No results for input(s): CKTOTAL, CKMB, CKMBINDEX, TROPONINI in the last 168 hours. BNP (last 3 results) No results for input(s): PROBNP in the last 8760 hours. HbA1C: No results for input(s): HGBA1C in the last 72 hours. CBG: No results for input(s): GLUCAP in the last 168 hours. Lipid Profile: No results for input(s): CHOL, HDL, LDLCALC, TRIG, CHOLHDL, LDLDIRECT in the last 72 hours. Thyroid Function Tests: No results for input(s): TSH, T4TOTAL, FREET4, T3FREE, THYROIDAB in the last 72 hours. Anemia Panel: No results for input(s): VITAMINB12, FOLATE, FERRITIN, TIBC, IRON, RETICCTPCT in the last 72 hours. Sepsis Labs: No results for input(s): PROCALCITON, LATICACIDVEN in the last 168 hours.  No results found for this or any previous visit (from the past 240 hour(s)).    Radiology Studies: No results found.   Scheduled Meds: . acetaminophen  1,000 mg Oral TID  . ALPRAZolam  0.25 mg Oral TID  . carvedilol  25 mg Oral BID WC  . chlorhexidine  60 mL Topical Once  . enoxaparin (LOVENOX) injection  40 mg Subcutaneous Q24H  . lidocaine  1 patch Transdermal Q24H  . tiotropium  18 mcg Inhalation Daily   Continuous Infusions: .  HYDROmorphone 0.3 mg/hr (01/16/17 1251)  . lactated ringers Stopped (01/13/17 1600)  . methocarbamol (ROBAXIN)  IV Stopped (01/13/17 1600)     LOS: 7 days    Time spent: Total of 25 minutes spent with pt, greater than 50% of which was spent in discussion of  treatment, counseling and coordination of care   Chipper Oman, MD Pager: Text Page via [email or web address]   If 7PM-7AM, please contact night-coverage [email or web address] 01/19/2017, 4:30 PM

## 2017-01-19 NOTE — Progress Notes (Addendum)
Daily Progress Note   Patient Name: Melissa Ferrell       Date: 01/19/2017 DOB: [birth year]  Age: 82 y.o. MRN#: [id number] Attending Physician: Patrecia Pour, Christean Grief, MD Primary Care Physician: Girtha Rm, NP-C Admit Date: 01/12/2017  Reason for Consultation/Follow-up: Establishing goals of care  Subjective: Patient is resting in bed, she is on basal rate Dilaudid PCA.    Reports pain is currently well controlled other than pain in back by shoulder blade.  She had me put pressure on the area for a few minutes and pain improved.  She has had two bolus doses of dilaudid since yesterday.  I called and discussed with her HCPOA, (son) Melissa Ferrell.  He reports that family would like to have opinion of Dr. Benay Spice prior to making transition back to hospice support.    We discussed options to begin to consider for discharge, including residential hospice vs [facility] with hospice care (or potentially rehab if decision were made for further disease modifying therapy after discussing with medical oncology).  Discussed also with Melissa Ferrell regarding her intermittent confusion.  He reports that family feels she is confused "90% of the time" which is a drastic change from her baseline.  She was living independently and functioning on her own prior to this admission.  We discussed likely multifactorial cause of confusion including hospital delirium potentially complicated by medication such as pain medication and benzodiazepines as well as possibility of disease progression.  See below  Length of Stay: 7  Current Medications: Scheduled Meds:  . acetaminophen  1,000 mg Oral TID  . ALPRAZolam  0.25 mg Oral TID  . carvedilol  25 mg Oral BID WC  . chlorhexidine  60 mL Topical Once  . enoxaparin (LOVENOX)  injection  40 mg Subcutaneous Q24H  . lidocaine  1 patch Transdermal Q24H  . tiotropium  18 mcg Inhalation Daily    Continuous Infusions: . HYDROmorphone 0.3 mg/hr (01/16/17 1251)  . lactated ringers Stopped (01/13/17 1600)  . methocarbamol (ROBAXIN)  IV Stopped (01/13/17 1600)    PRN Meds: acetaminophen **OR** acetaminophen, albuterol, hydrOXYzine, metoCLOPramide **OR** metoCLOPramide (REGLAN) injection, ondansetron **OR** ondansetron (ZOFRAN) IV  Physical Exam         Elderly lady resting in bed She is in no distress Eyes closed resting peacefully Pleasantly confused Regular S1 S2 Abdomen soft  No edema  Vital Signs: BP 138/82 (BP Location: Left Arm)   Pulse 67   Temp 98.6 F (37 C) (Oral)   Resp 18   Ht 5\' 4"  (1.626 m)   Wt 80.7 kg (178 lb)   SpO2 98%   BMI 30.55 kg/m  SpO2: SpO2: 98 % O2 Device: O2 Device: Nasal Cannula O2 Flow Rate: O2 Flow Rate (L/min): 2 L/min  Intake/output summary:   Intake/Output Summary (Last 24 hours) at 01/19/2017 1005 Last data filed at 01/19/2017 0530 Gross per 24 hour  Intake 120 ml  Output 800 ml  Net -680 ml   LBM: Last BM Date: 01/16/18 Baseline Weight: Weight: 80.7 kg (178 lb) Most recent weight: Weight: 80.7 kg (178 lb)       Palliative Assessment/Data: PPS 30%   Flowsheet Rows     Most Recent Value  Intake Tab  Referral Department  Hospitalist  Unit at Time of Referral  Oncology Unit  Palliative Care Primary Diagnosis  Cancer  Date Notified  01/14/17  Palliative Care Type  New Palliative care  Reason for referral  Pain  Date of Admission  01/12/17  Date first seen by Palliative Care  01/15/17  # of days Palliative referral response time  1 Day(s)  # of days IP prior to Palliative referral  2  Clinical Assessment  Palliative Performance Scale Score  30%  Pain Max last 24 hours  6  Pain Min Last 24 hours  4  Dyspnea Max Last 24 Hours  3  Dyspnea Min Last 24 hours  2  Nausea Max Last 24 Hours  3  Nausea Min  Last 24 Hours  2  Anxiety Max Last 24 Hours  4  Anxiety Min Last 24 Hours  3  Psychosocial & Spiritual Assessment  Palliative Care Outcomes  Patient/Family meeting held?  Yes  Who was at the meeting?  patient, son Melissa Ferrell who is HCPOA agent.       Patient Active Problem List   Diagnosis Date Noted  . Encounter for palliative care   . Goals of care, counseling/discussion   . Neoplasm of bone, malignant ([place]) 01/13/2017  . Femur fracture ([place]) 01/12/2017  . Pathological fracture in neoplastic disease, left femur, initial encounter for fracture ([place])   . CKD (chronic kidney disease) stage 3, GFR 30-59 ml/min ([facility]) 08/24/2016  . Osteopenia determined by x-ray   . Generalized anxiety disorder 07/27/2015  . Depression and anxiety 12/02/2014  . Shortness of breath 07/01/2014  . PVD (peripheral vascular disease) ([place]) 05/02/2014  . Lower extremity edema 04/30/2014  . Varicose veins of lower extremities with other complications 81/27/5170  . Cerebrovascular disease 08/20/2010  . Hyperlipemia 10/15/2006  . MIGRAINE HEADACHE 10/15/2006  . Essential hypertension 10/15/2006  . COPD (chronic obstructive pulmonary disease) with emphysema ([place]) 10/15/2006    Palliative Care Assessment & Plan   Patient Profile:    Assessment:  recent L femur fracture s/p repair Recent diagnosis of cancer of bone, deemed to have lung primary, saw oncologist at [facility] last month HTN COPD Has depression anxiety and generalized anxiety disorder Uncontrolled pain Existential suffering Possibly entering into the dying process Stage III CKD  Recommendations/Plan:  Continue Dilaudid infusion at 0.3 mg per hour. Continue IV PRN Dilaudid 0.5 mg Q 2 hours PRN and d/c other prn medications.  I did discuss potential to rotate to different medication in order to see if any confusion improves.  Family would like to wait until medical oncology weighs in  prior to making any further changes.  Additionally, I let family  know that if we are working towards discharge somewhere other than residential hospice (which I do not think she would likely qualify for at this point) we will need to transition off of continuous infusion to another pain regimen.  Once we determine if she is going to discharge with hospice, I will coordinate with hospice in order to rotate her to their preferred regimen which hospice can then further manage as an outpatient.  She continues to have periods of anxiety.  Continue scheduled low dose Xanax per request by family.  I did discuss that this may be adding to confusion, however, family would like to continue her current regimen until she is seen by medical oncology determination is made if she is going to go with hospice support.  Her sons agree that she is not going to gain significant benefit from pursuing radiation therapy.  Her son requested today to meet with medical oncology (he named Dr. Benay Spice by name but I am not sure she has a prior relationship with him) prior to making final decision about discharging with hospice.    At this point, I believe we will need to consider disposition options other than residential hospice.  I did begin to discuss possibility of transition back to [facility] with hospice support with her son today.  He would like to wait for medical oncology's opinion, and then is in agreement with reconsulting hospice if appropriate based upon oncology evaluation.   Code Status:    Code Status Orders  (From admission, onward)        Start     Ordered   01/15/17 1017  Do not attempt resuscitation (DNR)  Continuous    Question Answer Comment  In the event of cardiac or respiratory ARREST Do not call a "code blue"   In the event of cardiac or respiratory ARREST Do not perform Intubation, CPR, defibrillation or ACLS   In the event of cardiac or respiratory ARREST Use medication by any route, position, wound care, and other measures to relive pain and suffering. May  use oxygen, suction and manual treatment of airway obstruction as needed for comfort.      01/15/17 1020    Code Status History    Date Active Date Inactive Code Status Order ID Comments User Context   01/12/2017 18:16 01/15/2017 10:20 Full Code [id number]  Wylene Simmer, MD Inpatient   01/12/2017 18:16 01/12/2017 18:16 DNR [id number]  Phillips Grout, MD Inpatient    Advance Directive Documentation     Most Recent Value  Type of Advance Directive  Out of facility DNR (pink MOST or yellow form)  Pre-existing out of facility DNR order (yellow form or pink MOST form)  No data  "MOST" Form in Place?  No data       Prognosis:   guarded   Discharge Planning:  To be determined, based on patient's trajectory.   Care plan was discussed with son, Vaughan Sine agent.   Thank you for allowing the Palliative Medicine Team to assist in the care of this patient.   Time In: 0840 Time Out: 0920 Total Time 40 Prolonged Time Billed  no       Greater than 50%  of this time was spent counseling and coordinating care related to the above assessment and plan.  Micheline Rough, MD [place] Team [phone]  Please contact Palliative Medicine Team phone at [phone] for questions and  concerns.

## 2017-01-19 NOTE — Progress Notes (Signed)
Physical Therapy Discharge Patient Details Name: Abagail Limb MRN: [id number] DOB: Jan 31, 1935 Today's Date: 01/19/2017 Time:  -     Patient discharged from PT services secondary to medical decline - will need to re-order PT to resume therapy services.  Please see latest therapy progress note for current level of functioning and progress toward goals.        Sherra, Kimmons 01/19/2017, 6:46 AM Tresa Endo PT [phone]

## 2017-01-20 DIAGNOSIS — R4182 Altered mental status, unspecified: Secondary | ICD-10-CM

## 2017-01-20 DIAGNOSIS — C787 Secondary malignant neoplasm of liver and intrahepatic bile duct: Secondary | ICD-10-CM

## 2017-01-20 DIAGNOSIS — C3490 Malignant neoplasm of unspecified part of unspecified bronchus or lung: Secondary | ICD-10-CM

## 2017-01-20 DIAGNOSIS — C7951 Secondary malignant neoplasm of bone: Secondary | ICD-10-CM

## 2017-01-20 DIAGNOSIS — N189 Chronic kidney disease, unspecified: Secondary | ICD-10-CM

## 2017-01-20 MED ORDER — HYDROMORPHONE BOLUS VIA INFUSION
0.5000 mg | INTRAVENOUS | Status: DC | PRN
  Administered 2017-01-20 (×4): 0.5 mg via INTRAVENOUS
  Filled 2017-01-20: qty 1

## 2017-01-20 MED ORDER — ALPRAZOLAM 0.25 MG PO TABS
0.2500 mg | ORAL_TABLET | Freq: Three times a day (TID) | ORAL | Status: DC | PRN
  Administered 2017-01-20 – 2017-01-21 (×2): 0.25 mg via ORAL
  Filled 2017-01-20 (×2): qty 1

## 2017-01-20 MED ORDER — HYDROMORPHONE HCL 1 MG/ML IJ SOLN
0.5000 mg | Freq: Once | INTRAMUSCULAR | Status: AC
  Administered 2017-01-20: 0.5 mg via INTRAVENOUS
  Filled 2017-01-20: qty 1

## 2017-01-20 MED ORDER — DEXAMETHASONE SODIUM PHOSPHATE 10 MG/ML IJ SOLN
4.0000 mg | Freq: Four times a day (QID) | INTRAMUSCULAR | Status: DC
  Administered 2017-01-20 – 2017-01-21 (×5): 4 mg via INTRAVENOUS
  Filled 2017-01-20 (×6): qty 1

## 2017-01-20 NOTE — Progress Notes (Addendum)
Wasted 3mls of Dilaudid from IVPB.

## 2017-01-20 NOTE — Progress Notes (Signed)
CSW following for disposition. Pt lives at [facility] where she was receiving home Hospice care ([facility]). Decided to revoke hospice 01/15/17 to pursue treatment (surgery and was hoping to go to rehab then back home), however she has declined significantly since then and pt's family now wanting to pursue residential hospice.   Per son, he is meeting with oncologist in the am and "want to make sure her pain medication regimen is how it should be before she goes to hospice."  CSW notified [facility] liaison of pt's interest in [facility] and will follow to [place], MSW, [place] Work 01/20/2017 [phone]

## 2017-01-20 NOTE — Consult Note (Signed)
New Hematology/Oncology Consult   Referral WC:BJSEGB Silva      Reason for Referral: Metastatic lung cancer  HPI:  [name] developed low back pain beginning in September 2018 moving a plant at home.  She saw Dr. Alvan Ferrell when the pain persisted and an MRI revealed a mass in the sacrum with bone destruction at the right side of the sacrum.  She had pain in the right leg with numbness.  She was referred to Dr. Nydia Ferrell in orthopedic oncology at [facility].  CTs of the chest, abdomen, and pelvis revealed a destructive mass of the sacrum.  Multiple solid and sub-solid nodular lesions were noted in the lungs felt to represent metastatic disease or multifocal lung cancer.  2 liver lesions were consistent with metastatic disease. A bone scan on 12/23/2016 revealed diffuse increased activity in the sacrum, focal increased activity at the distal left femur, and increased activity in the right mid humeral diaphysis consistent with metastatic disease.  Dr. Nydia Ferrell felt the clinical presentation was consistent with metastatic lung cancer.  [name] decided against a biopsy and oncology referral.  She elected to have comfort care and enrolled in the [facility] hospice program. She was living independently in a retirement community until she had a fall and fracture of the left femur 01/12/2017.  Dr. Doran Durand was consulted and she was taken to the operating room 01/12/2017 for retrograde intramedullary nailing of the left femur.  She remains hospitalized following surgery.  The pathology from the left femur ([id number]) revealed metastatic adenocarcinoma with the tumor cells staining positive for cytokeratin 7, TTF-1, and napsin A consistent with a lung primary.  She continues to have pain at the sacrum and right leg.  She also has discomfort at the right upper arm.  She has been seen in consultation by the palliative care service and radiation oncology.  Radiation was not recommended secondary to her poor  prognosis.  Her son is at the bedside this morning.  [name] has been confused for the past several days.  This has been addressed by Dr. Domingo Cocking.  She complains of pain at the sacrum and leg this morning.  Ms.Melissa Ferrell remains partially confused this morning.  Most of the history is from her son and the electronic medical record.  The goal of the patient and family is comfort.  They understand she will no longer be able to live independently.       Past Medical History:  Diagnosis Date  . Anxiety and depression   . Carotid artery disease (HCC)    hx CAD, dCHF listed in [place] - sees cardiologist, vasc and neurologist  . CKD (chronic kidney disease) stage 3, GFR 30-59 ml/min ([place]) 08/24/2016  . COPD (chronic obstructive pulmonary disease) [facility])    sees pulmonology  . DDD (degenerative disc disease)   . Disseminated superficial actinic porokeratosis    bilateral calves  . Fracture, spinal   . GERD (gastroesophageal reflux disease)    hx PUD 2ndary to NSAIDs 1975  . Hemorrhoids   . HTN (hypertension)   . Hx of adenomatous colonic polyps   . Hyperlipidemia    intol to statins  . IBS (irritable bowel syndrome)   . Memory loss    seen by neurology in 2015 and referred to neuropsych for anxiety  . Migraine headache   . Osteopenia determined by x-ray   . RHEUMATIC FEVER, HX OF 10/15/2006   Qualifier: Diagnosis of  By: Jenny Reichmann MD, Hunt Oris   . Rheumatoid arthritis(714.0)  per her report, in remission for many years per her report  . Varicose veins    hx LE edema  :  Past Surgical History:  Procedure Laterality Date  . chronically infected lymph node removed  1973  . FEMUR IM NAIL Left 01/12/2017   Procedure: INTRAMEDULLARY (IM) RETROGRADE FEMORAL NAILING;  Surgeon: Wylene Simmer, MD;  Location: [facility];  Service: Orthopedics;  Laterality: Left;  . THYROGLOSSAL DUCT CYST     1943, 1944, 1973  :   Current Facility-Administered Medications:  .  acetaminophen (TYLENOL) tablet 650  mg, 650 mg, Oral, Q4H PRN **OR** acetaminophen (TYLENOL) suppository 650 mg, 650 mg, Rectal, Q4H PRN, Wylene Simmer, MD .  acetaminophen (TYLENOL) tablet 1,000 mg, 1,000 mg, Oral, TID, Derrill Kay A, MD, 1,000 mg at 01/20/17 0927 .  albuterol (PROVENTIL) (2.5 MG/3ML) 0.083% nebulizer solution 2.5 mg, 2.5 mg, Nebulization, Q6H PRN, Phillips Grout, MD .  ALPRAZolam Duanne Moron) tablet 0.25 mg, 0.25 mg, Oral, TID, Micheline Rough, MD, 0.25 mg at 01/20/17 0928 .  carvedilol (COREG) tablet 25 mg, 25 mg, Oral, BID WC, Derrill Kay A, MD, 25 mg at 01/20/17 0747 .  chlorhexidine (HIBICLENS) 4 % liquid 4 application, 60 mL, Topical, Once, Lisette Abu, PA-C .  dexamethasone (DECADRON) injection 4 mg, 4 mg, Intravenous, Q6H, Patrecia Pour, Christean Grief, MD, 4 mg at 01/20/17 9379 .  enoxaparin (LOVENOX) injection 40 mg, 40 mg, Subcutaneous, Q24H, Wylene Simmer, MD, 40 mg at 01/20/17 0747 .  HYDROmorphone (DILAUDID) 50 mg in sodium chloride 0.9 % 100 mL (0.5 mg/mL) infusion, 0.3 mg/hr, Intravenous, Continuous, Anwar, Zeba, MD, Last Rate: 0.6 mL/hr at 01/16/17 1251, 0.3 mg/hr at 01/16/17 1251 .  HYDROmorphone (DILAUDID) bolus via infusion 0.5 mg, 0.5 mg, Intravenous, Q1H PRN, Domingo Cocking, Gene, MD .  hydrOXYzine (VISTARIL) injection 25 mg, 25 mg, Intramuscular, Q6H PRN, Robbie Lis, MD, 25 mg at 01/18/17 0630 .  lactated ringers infusion, , Intravenous, Continuous, Montez Hageman, MD, Stopped at 01/13/17 1600 .  lidocaine (LIDODERM) 5 % 1 patch, 1 patch, Transdermal, Q24H, Loistine Chance, MD, 1 patch at 01/17/17 1459 .  methocarbamol (ROBAXIN) 1,000 mg in dextrose 5 % 50 mL IVPB, 1,000 mg, Intravenous, Once, Lisette Abu, PA-C, Stopped at 01/13/17 1600 .  metoCLOPramide (REGLAN) tablet 5-10 mg, 5-10 mg, Oral, Q8H PRN **OR** metoCLOPramide (REGLAN) injection 5-10 mg, 5-10 mg, Intravenous, Q8H PRN, Wylene Simmer, MD .  ondansetron (ZOFRAN) tablet 4 mg, 4 mg, Oral, Q6H PRN **OR** ondansetron (ZOFRAN) injection 4 mg, 4 mg,  Intravenous, Q6H PRN, Wylene Simmer, MD, 4 mg at 01/17/17 1307 .  tiotropium (SPIRIVA) inhalation capsule 18 mcg, 18 mcg, Inhalation, Daily, Jodie Echevaria, Amrit, MD, 18 mcg at 01/19/17 1052:  . acetaminophen  1,000 mg Oral TID  . ALPRAZolam  0.25 mg Oral TID  . carvedilol  25 mg Oral BID WC  . chlorhexidine  60 mL Topical Once  . dexamethasone  4 mg Intravenous Q6H  . enoxaparin (LOVENOX) injection  40 mg Subcutaneous Q24H  . lidocaine  1 patch Transdermal Q24H  . tiotropium  18 mcg Inhalation Daily  :  Allergies  Allergen Reactions  . Atrovent [Ipratropium] Anaphylaxis  . Bee Venom Anaphylaxis  . Qvar [Beclomethasone] Anaphylaxis  . Stiolto Respimat [Tiotropium Bromide-Olodaterol] Anaphylaxis  . Anoro Ellipta [Umeclidinium-Vilanterol]     HEADACHES  . Prednisone Other (See Comments)    Per patient: was told that she was not to take it by her early Rheumatology physician  . Benzocaine Itching  and Other (See Comments)    Blisters  . Breo Ellipta [Fluticasone Furoate-Vilanterol]     Headache, anxiety.   Thayer Jew Hcl]   . Cefdinir Other (See Comments)    unknown  . Ciprofloxacin Other (See Comments)    unknown  . Diphenhydramine Hcl Other (See Comments)    "benadryl"--pain   . Erythromycin Other (See Comments)    Headache   . Indomethacin Other (See Comments)    Headache   . Lasix [Furosemide] Other (See Comments)    Low tolerance - swelling    . Other     Adhesive tape--blisters  . Statins     Low tolerance    . Tobramycin Itching  . Zetia [Ezetimibe]     Low tolerance   . Azithromycin Rash  . Paba Derivatives Rash  . Penicillins Itching and Rash    Has patient had a PCN reaction causing immediate rash, facial/tongue/throat swelling, SOB or lightheadedness with hypotension: No Has patient had a PCN reaction causing severe rash involving mucus membranes or skin necrosis: No Has patient had a PCN reaction that required hospitalization: No Has patient  had a PCN reaction occurring within the last 10 years: No If all of the above answers are "NO", then may proceed with Cephalosporin use.   :   SOCIAL HISTORY: She was living independently at [facility] prior to admission.  She has a history of tobacco use.  No alcohol use.  Review of Systems:  Positives include: Pain in the right leg, sacrum, and right arm.  Numbness in the right leg and foot  A complete ROS was otherwise negative.   Physical Exam:  Blood pressure (!) 178/80, pulse 64, temperature 98.5 F (36.9 C), temperature source Oral, resp. rate 16, height 5\' 4"  (1.626 m), weight 178 lb (80.7 kg), SpO2 93 %.  HEENT: Neck without mass Lungs: Clear anteriorly, no respiratory distress Cardiac: Regular rate and rhythm Abdomen: No hepatosplenomegaly, no mass, nontender  Vascular: No leg edema Lymph nodes: No cervical, supraclavicular, or axillary nodes Neurologic: Alert, follow simple commands, oriented to place and year.  Has difficulty focusing when questioned.  The motor exam appears grossly intact in the upper and lower extremities bilaterally Skin: No rash Musculoskeletal: Bandage covering the left leg Breast: Bilateral breast without mass  LABS:  Recent Labs    01/18/17 0431  WBC 7.5  HGB 9.8*  HCT 29.9*  PLT 269    Recent Labs    01/18/17 0431 01/19/17 0502  NA 136  --   K 3.5  --   CL 105  --   CO2 26  --   GLUCOSE 96  --   BUN 14  --   CREATININE 0.90 0.74  CALCIUM 8.8*  --       RADIOLOGY:  Ct Head Wo Contrast  Result Date: 01/19/2017 CLINICAL DATA:  82 y/o F; altered level of consciousness. Delirium. Recent femur fracture. EXAM: CT HEAD WITHOUT CONTRAST TECHNIQUE: Contiguous axial images were obtained from the base of the skull through the vertex without intravenous contrast. COMPARISON:  None. FINDINGS: Brain: Oval hypodense mass in the right frontal lobe measuring 2.4 x 1.6 x 2.7 cm (AP x ML x CC series 2, image 25 and series 5, image 23)  with surrounding region of white matter hypoattenuation likely representing edema. There is mild local mass effect with sulcal effacement and 3 mm right-to-left midline shift. No large acute stroke or hemorrhage is identified. Mild chronic microvascular ischemic changes  and parenchymal volume loss of the brain are present. Vascular: Nonspecific calcification right sylvian fissure. Skull: Normal. Negative for fracture or focal lesion. Sinuses/Orbits: No acute finding. Other: None. IMPRESSION: 1. Round hypodense mass in right frontal lobe measuring up to 2.7 cm with surrounding edema. Findings are suspicious for brain abscess. Cystic CNS neoplasm or metastasis is possible. A demyelinating or inflammatory lesion is unlikely. MRI of the brain preferably with and without contrast is recommended. 2. Mild mass effect with 3 mm right-to-left midline shift. No downward herniation. These results will be called to the ordering clinician or representative by the Radiologist Assistant, and communication documented in the PACS or zVision Dashboard. Electronically Signed   By: Kristine Garbe M.D.   On: 01/19/2017 19:10   Dg Chest Port 1 View  Result Date: 01/12/2017 CLINICAL DATA:  Preop.  Femur fracture. EXAM: PORTABLE CHEST 1 VIEW COMPARISON:  09/26/2016 FINDINGS: The cardiac silhouette is upper limits of normal in size, unchanged. Aortic atherosclerosis is noted. The lungs are slightly hyperinflated with slight interstitial coarsening consistent with history of COPD. No confluent airspace opacity, edema, pleural effusion, or pneumothorax is identified. IMPRESSION: No active disease. Electronically Signed   By: Logan Bores M.D.   On: 01/12/2017 10:27   Dg C-arm 1-60 Min-no Report  Result Date: 01/12/2017 Fluoroscopy was utilized by the requesting physician.  No radiographic interpretation.   Dg Hip Unilat W Or Wo Pelvis 2-3 Views Left  Result Date: 01/12/2017 CLINICAL DATA:  Slipped to the for while  getting into a wheelchair this morning. The patient is complaining of left femur pain. Patient has known metastatic disease to the right hip. EXAM: DG HIP (WITH OR WITHOUT PELVIS) 2-3V LEFT COMPARISON:  Left femur series of today's date FINDINGS: The bones are subjectively osteopenic. The bony pelvis appears intact. AP and lateral views of the left hip reveal narrowing of the joint space. The acetabulum and femoral head appear intact. The femoral neck, intertrochanteric, and immediate sub trochanteric regions are normal. IMPRESSION: There is mild osteoarthritic joint space loss of the left hip. No acute fracture or lytic or blastic lesion of the left hip is observed. Electronically Signed   By: David  Martinique M.D.   On: 01/12/2017 10:03   Dg Femur Min 2 Views Left  Result Date: 01/12/2017 CLINICAL DATA:  Open reduction internal fixation for fracture EXAM: LEFT FEMUR 2 VIEWS COMPARISON:  Preoperative evaluation January 12, 2017 FLUOROSCOPY TIME:  1 minutes 27 seconds; 9.10 mGy ; 4 acquired images FINDINGS: Frontal and lateral views were obtained. There is screw and nail fixation through a comminuted fracture of the distal femoral diaphysis. Alignment at the fracture site is near anatomic. No other fractures are evident. No dislocation. Visualized joint spaces appear unremarkable. IMPRESSION: Near anatomic alignment following screw and nail fixation through a comminuted fracture of the distal femoral diaphysis. No new fracture evident. No evident dislocation. Electronically Signed   By: Lowella Grip III M.D.   On: 01/12/2017 18:51   Dg Femur Min 2 Views Left  Result Date: 01/12/2017 CLINICAL DATA:  The patient slipped to the floor while getting into this wheelchair this morning. The patient is complaining of left hip pain and has obvious deformity. EXAM: LEFT FEMUR 2 VIEWS COMPARISON:  Left hip series of today's date. FINDINGS: There is an acute displaced fracture of the distal radial metadiaphysis.  There is angulation with the apex anterior. The fracture line does not reach the femoral condyles. More proximally the femoral shaft is intact.  The femoral head, neck, and intertrochanteric regions or included on the left hip series and wrist reveal no acute abnormalities. IMPRESSION: There is an acute angulated displaced fracture of the distal left femoral metadiaphysis. Electronically Signed   By: David  Martinique M.D.   On: 01/12/2017 10:04    Assessment and Plan:   1.  Metastatic non-small cell lung cancer  CTs and bone scan at [facility] confirmed multiple bone metastases, multiple lung masses, and liver metastases  MRI of the spine November 2018-destructive sacrum lesion  Pathologic left femur fracture 01/12/2017-pathology consistent with metastatic adenocarcinoma of the lung  Brain CT 01/19/2017- right frontal mass with surrounding edema and mild mass-effect  2.  Altered mental status-likely secondary to polypharmacy, hospital delirium, and the frontal brain metastasis  3.  Pathologic fracture of the left femur 01/12/2017-status post intramedullary nailing  4.  COPD  5.  Chronic renal failure  6.  Hypertension  7.  Pain secondary to metastatic non-small cell lung cancer involving the bones   [name] has a clinical history consistent with a diagnosis of metastatic non-small cell lung cancer.  I discussed the diagnosis, prognosis, and treatment options with [name] and her son.  I explained the standard of care is to obtain molecular testing in patients with non-small cell lung cancer to see if the are a candidate for targeted therapy.  It is uncommon to be a candidate for targeted therapy in the general lung cancer population, especially in smokers.  We discussed immunotherapy and systemic chemotherapy.  We also discussed palliative radiation.  Ms. Igo indicates quality of life is most important to her.  They do not wish to pursue additional testing or systemic therapy.  I do not  recommend radiation for treatment of the brain metastasis.  The goal is comfort care.  She appears to be a candidate for [employer] given the extent of metastatic disease and brain metastasis.  I explained it can be difficult to achieve a balance between sedation/confusion and pain control.  However I suspect some of the confusion is related to hospital delirium and the frontal brain lesion.  Recommendations: 1.  Continue narcotic analgesics as recommended by Dr. Domingo Cocking 2.  Limit medications with CNS toxicity as possible, consider changing Xanax to as needed 3.  Trial of Decadron for help with the severe sacrum/right leg pain  4.  [facility] hospice referral to consider transfer to [facility]  I discussed the case with Dr. Domingo Cocking.  Please call Oncology as needed.  I will check on her 01/22/2017.   Betsy Coder, MD 01/20/2017, 10:55 AM

## 2017-01-20 NOTE — Progress Notes (Signed)
PROGRESS NOTE [facility]   Melissa Ferrell   GGY:[id number] DOB: Apr 16, 1935  DOA: 01/12/2017 PCP: Girtha Rm, NP-C   Brief Narrative:  Melissa Ferrell is a 82 year old female with past medical history of recently diagnosed malignancy of bone of unknown type, CAD, RA, hypertension, COPD, CKD who was brought to the emergency department after slipping off her chair and injuring her leg. She was just placed on hospice supportive care for pain control. Patient was found to have a left distal femoral fracture on presentation. Underwent intervention by orthopedics.  Subjective: Patient seen and examined, she continues to be delirious, CT of the head shows brain mass ? Mets with left shift.   Assessment & Plan:   Principal Problem:   Femur fracture ([place]) Active Problems:   Essential hypertension   COPD (chronic obstructive pulmonary disease) with emphysema (HCC)   Depression and anxiety   Generalized anxiety disorder   CKD (chronic kidney disease) stage 3, GFR 30-59 ml/min (HCC)   Neoplasm of bone, malignant ([place])   Encounter for palliative care   Goals of care, counseling/discussion   Malignant neoplasm of lung ([place])  Left Femur fracture - Likely pathological fracture with metastatic bone tumor. Status post intramedullary retrograde femoral nailing on the left. 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 with edema and left shift  - Will give a trial of decadron to help with pain and inflamation  - Given CNS involvement will changes Xanax to PRN   Malignant neoplasm of bone: - Poor prognosis  - Rad oncology consulted and did not recommended radiation therapy, risk outweigh the benefits  - Medical oncology consulted - recommended hospice   Essential hypertension - BP stable  - Continue Coreg  COPD - Stable  - Monitor resp status closely as patient is on continues opiates    CKD stage III - Cr at baseline   Goal of care  Patient with very poor prognosis, she continues to decline, now with new brain involvement and requiring continuous pain infusion, she probably have weeks. She has some intermittent nutrition. I agree with residential hospice for EOL care. Family want to pursue [facility] place for residential hospice.   DVT prophylaxis: Lovenox  Code Status: DNR  Family Communication: None at bedside  Disposition Plan: [place] place when bed available   Consultants:   Ortho   Palliative   Rad Onc  Procedures:   Intramedullary retrograde femoral nailing on the left   Antimicrobials: Anti-infectives (From admission, onward)   Start     Dose/Rate Route Frequency Ordered Stop   01/13/17 0600  clindamycin (CLEOCIN) IVPB 900 mg     900 mg 100 mL/hr over 30 Minutes Intravenous On call to O.R. 01/12/17 1434 01/12/17 1600   01/12/17 1439  clindamycin (CLEOCIN) 900 MG/50ML IVPB    Comments:  Ferrell, Melissa  : cabinet override      01/12/17 1439 01/13/17 0244      Objective: Vitals:   01/19/17 2313 01/20/17 0550 01/20/17 0757 01/20/17 1640  BP: (!) 155/69 (!) 168/86 (!) 178/80 (!) 186/101  Pulse: 70 77 64 77  Resp: 16 16    Temp: 98.3 F (36.8 C) 98.5 F (36.9 C)    TempSrc: Oral Oral    SpO2: 94% 93%    Weight:      Height:        Intake/Output Summary (Last 24 hours) at 01/20/2017 1754 Last data  filed at 01/19/2017 1905 Gross per 24 hour  Intake 150 ml  Output -  Net 150 ml   Filed Weights   01/12/17 1452  Weight: 80.7 kg (178 lb)    Examination:  General: Confuse  Cardiovascular: RRR, S1/S2 Respiratory: CTA bilaterally Abdominal: Soft, NT, ND Extremities: no edema  Data Reviewed: I have personally reviewed following labs and imaging studies  CBC: Recent Labs  Lab 01/14/17 0353 01/15/17 0730 01/16/17 0408 01/18/17 0431  WBC 9.0 8.9 6.3 7.5  NEUTROABS 7.2  --   --   --   HGB 9.8* 10.2* 9.3* 9.8*  HCT 29.5* 31.0*  27.9* 29.9*  MCV 96.1 93.9 95.5 94.0  PLT 185 219 224 650   Basic Metabolic Panel: Recent Labs  Lab 01/15/17 0730 01/16/17 0408 01/18/17 0431 01/19/17 0502  NA 136 136 136  --   K 3.5 3.5 3.5  --   CL 104 105 105  --   CO2 25 26 26   --   GLUCOSE 110* 94 96  --   BUN 12 13 14   --   CREATININE 0.78 0.82 0.90 0.74  CALCIUM 8.6* 8.8* 8.8*  --    GFR: Estimated Creatinine Clearance: 58.6 mL/min (by C-G formula based on SCr of 0.74 mg/dL). Liver Function Tests: No results for input(s): AST, ALT, ALKPHOS, BILITOT, PROT, ALBUMIN in the last 168 hours. No results for input(s): LIPASE, AMYLASE in the last 168 hours. No results for input(s): AMMONIA in the last 168 hours. Coagulation Profile: No results for input(s): INR, PROTIME in the last 168 hours. Cardiac Enzymes: No results for input(s): CKTOTAL, CKMB, CKMBINDEX, TROPONINI in the last 168 hours. BNP (last 3 results) No results for input(s): PROBNP in the last 8760 hours. HbA1C: No results for input(s): HGBA1C in the last 72 hours. CBG: No results for input(s): GLUCAP in the last 168 hours. Lipid Profile: No results for input(s): CHOL, HDL, LDLCALC, TRIG, CHOLHDL, LDLDIRECT in the last 72 hours. Thyroid Function Tests: No results for input(s): TSH, T4TOTAL, FREET4, T3FREE, THYROIDAB in the last 72 hours. Anemia Panel: No results for input(s): VITAMINB12, FOLATE, FERRITIN, TIBC, IRON, RETICCTPCT in the last 72 hours. Sepsis Labs: No results for input(s): PROCALCITON, LATICACIDVEN in the last 168 hours.  No results found for this or any previous visit (from the past 240 hour(s)).    Radiology Studies: Ct Head Wo Contrast  Result Date: 01/19/2017 CLINICAL DATA:  82 y/o F; altered level of consciousness. Delirium. Recent femur fracture. EXAM: CT HEAD WITHOUT CONTRAST TECHNIQUE: Contiguous axial images were obtained from the base of the skull through the vertex without intravenous contrast. COMPARISON:  None. FINDINGS: Brain:  Oval hypodense mass in the right frontal lobe measuring 2.4 x 1.6 x 2.7 cm (AP x ML x CC series 2, image 25 and series 5, image 23) with surrounding region of white matter hypoattenuation likely representing edema. There is mild local mass effect with sulcal effacement and 3 mm right-to-left midline shift. No large acute stroke or hemorrhage is identified. Mild chronic microvascular ischemic changes and parenchymal volume loss of the brain are present. Vascular: Nonspecific calcification right sylvian fissure. Skull: Normal. Negative for fracture or focal lesion. Sinuses/Orbits: No acute finding. Other: None. IMPRESSION: 1. Round hypodense mass in right frontal lobe measuring up to 2.7 cm with surrounding edema. Findings are suspicious for brain abscess. Cystic CNS neoplasm or metastasis is possible. A demyelinating or inflammatory lesion is unlikely. MRI of the brain preferably with and without contrast is  recommended. 2. Mild mass effect with 3 mm right-to-left midline shift. No downward herniation. These results will be called to the ordering clinician or representative by the Radiologist Assistant, and communication documented in the PACS or zVision Dashboard. Electronically Signed   By: Kristine Garbe M.D.   On: 01/19/2017 19:10     Scheduled Meds: . acetaminophen  1,000 mg Oral TID  . ALPRAZolam  0.25 mg Oral TID  . carvedilol  25 mg Oral BID WC  . chlorhexidine  60 mL Topical Once  . dexamethasone  4 mg Intravenous Q6H  . enoxaparin (LOVENOX) injection  40 mg Subcutaneous Q24H  . lidocaine  1 patch Transdermal Q24H  . tiotropium  18 mcg Inhalation Daily   Continuous Infusions: . HYDROmorphone 0.3 mg/hr (01/20/17 1732)  . lactated ringers Stopped (01/13/17 1600)  . methocarbamol (ROBAXIN)  IV Stopped (01/13/17 1600)     LOS: 8 days    Time spent: Total of 25 minutes spent with pt, greater than 50% of which was spent in discussion of  treatment, counseling and coordination of  care   Chipper Oman, MD Pager: Text Page via [email or web address]   If 7PM-7AM, please contact night-coverage [email or web address] 01/20/2017, 5:54 PM

## 2017-01-20 NOTE — Progress Notes (Signed)
Daily Progress Note   Patient Name: Melissa Ferrell       Date: 01/20/2017 DOB: 09/02/1935  Age: 82 y.o. MRN#: [id number] Attending Physician: Patrecia Pour, Christean Grief, MD Primary Care Physician: Girtha Rm, NP-C Admit Date: 01/12/2017  Reason for Consultation/Follow-up: Establishing goals of care  Subjective: Patient is resting in bed, she is on basal rate Dilaudid PCA.    Reports pain is currently well controlled.  She does appear weaker to me today.  I called and discussed with her HCPOA, (son) [name].  He reports speaking with Dr. Benay Spice this morning and understands that she has been found to have mass in her brain with edema and midline shift.  He reports initially discussing plan to transition back to [facility] with hospice with Dr. Benay Spice, however, after reviewing the significant new findings of brain mets and consequences that will likely come from having a brain mass with beginning midline shift, [name] reports that they discussed possible transition to [facility] for end-of-life care with concern for higher symptom burden and a much shorter prognosis than originally anticipated.    See below  Length of Stay: 8  Current Medications: Scheduled Meds:  . acetaminophen  1,000 mg Oral TID  . ALPRAZolam  0.25 mg Oral TID  . carvedilol  25 mg Oral BID WC  . chlorhexidine  60 mL Topical Once  . dexamethasone  4 mg Intravenous Q6H  . enoxaparin (LOVENOX) injection  40 mg Subcutaneous Q24H  . lidocaine  1 patch Transdermal Q24H  . tiotropium  18 mcg Inhalation Daily    Continuous Infusions: . HYDROmorphone 0.3 mg/hr (01/16/17 1251)  . lactated ringers Stopped (01/13/17 1600)  . methocarbamol (ROBAXIN)  IV Stopped (01/13/17 1600)    PRN Meds: acetaminophen **OR**  acetaminophen, albuterol, HYDROmorphone, hydrOXYzine, metoCLOPramide **OR** metoCLOPramide (REGLAN) injection, ondansetron **OR** ondansetron (ZOFRAN) IV  Physical Exam         Elderly lady resting in bed She is in no distress Eyes closed resting peacefully Pleasantly confused Regular S1 S2 Abdomen soft No edema  Vital Signs: BP (!) 178/80 (BP Location: Left Arm)   Pulse 64   Temp 98.5 F (36.9 C) (Oral)   Resp 16   Ht 5\' 4"  (1.626 m)   Wt 80.7 kg (178 lb)   SpO2 93%   BMI 30.55 kg/m  SpO2: SpO2: 93 % O2 Device: O2 Device: Nasal Cannula O2 Flow Rate: O2 Flow Rate (L/min): 2 L/min  Intake/output summary:   Intake/Output Summary (Last 24 hours) at 01/20/2017 1310 Last data filed at 01/19/2017 1905 Gross per 24 hour  Intake 270 ml  Output 800 ml  Net -530 ml   LBM: Last BM Date: 01/16/17 Baseline Weight: Weight: 80.7 kg (178 lb) Most recent weight: Weight: 80.7 kg (178 lb)       Palliative Assessment/Data: PPS 30%   Flowsheet Rows     Most Recent Value  Intake Tab  Referral Department  Hospitalist  Unit at Time of Referral  Oncology Unit  Palliative Care Primary Diagnosis  Cancer  Date Notified  01/14/17  Palliative Care Type  New Palliative care  Reason for referral  Pain  Date of Admission  01/12/17  Date first seen by Palliative Care  01/15/17  # of days Palliative referral response time  1 Day(s)  # of days IP prior to Palliative referral  2  Clinical Assessment  Palliative Performance Scale Score  30%  Pain Max last 24 hours  6  Pain Min Last 24 hours  4  Dyspnea Max Last 24 Hours  3  Dyspnea Min Last 24 hours  2  Nausea Max Last 24 Hours  3  Nausea Min Last 24 Hours  2  Anxiety Max Last 24 Hours  4  Anxiety Min Last 24 Hours  3  Psychosocial & Spiritual Assessment  Palliative Care Outcomes  Patient/Family meeting held?  Yes  Who was at the meeting?  patient, son [name] who is HCPOA agent.       Patient Active Problem List   Diagnosis Date  Noted  . Encounter for palliative care   . Goals of care, counseling/discussion   . Neoplasm of bone, malignant ([place]) 01/13/2017  . Femur fracture ([place]) 01/12/2017  . Pathological fracture in neoplastic disease, left femur, initial encounter for fracture ([place])   . CKD (chronic kidney disease) stage 3, GFR 30-59 ml/min (HCC) 08/24/2016  . Osteopenia determined by x-ray   . Generalized anxiety disorder 07/27/2015  . Depression and anxiety 12/02/2014  . Shortness of breath 07/01/2014  . PVD (peripheral vascular disease) ([place]) 05/02/2014  . Lower extremity edema 04/30/2014  . Varicose veins of lower extremities with other complications 86/76/1950  . Cerebrovascular disease 08/20/2010  . Hyperlipemia 10/15/2006  . MIGRAINE HEADACHE 10/15/2006  . Essential hypertension 10/15/2006  . COPD (chronic obstructive pulmonary disease) with emphysema ([place]) 10/15/2006    Palliative Care Assessment & Plan   Patient Profile:    Assessment:  recent L femur fracture s/p repair Recent diagnosis of cancer of bone, deemed to have lung primary, saw oncologist at [place] last month HTN COPD Has depression anxiety and generalized anxiety disorder Uncontrolled pain Existential suffering Possibly entering into the dying process Stage III CKD  Recommendations/Plan:  Continue Dilaudid infusion at 0.3 mg per hour. Continue IV PRN Dilaudid 0.5 mg Q 1 hours PRN.    She continues to have periods of anxiety.  Continue scheduled low dose Xanax per request by family.  Her son/HCPOA, [name], reports speaking with Dr. Benay Spice this morning.  After discussing with Dr. Benay Spice, he would like to pursue placement at [facility] for end of life care.  Will plan for referral to [facility] for EOL care.   Code Status:    Code Status Orders  (From admission, onward)        Start  Ordered   01/15/17 1017  Do not attempt resuscitation (DNR)  Continuous    Question Answer Comment  In the event of cardiac  or respiratory ARREST Do not call a "code blue"   In the event of cardiac or respiratory ARREST Do not perform Intubation, CPR, defibrillation or ACLS   In the event of cardiac or respiratory ARREST Use medication by any route, position, wound care, and other measures to relive pain and suffering. May use oxygen, suction and manual treatment of airway obstruction as needed for comfort.      01/15/17 1020    Code Status History    Date Active Date Inactive Code Status Order ID Comments User Context   01/12/2017 18:16 01/15/2017 10:20 Full Code [id number]  Wylene Simmer, MD Inpatient   01/12/2017 18:16 01/12/2017 18:16 DNR [id number]  Phillips Grout, MD Inpatient    Advance Directive Documentation     Most Recent Value  Type of Advance Directive  Out of facility DNR (pink MOST or yellow form)  Pre-existing out of facility DNR order (yellow form or pink MOST form)  No data  "MOST" Form in Place?  No data       Prognosis:   Likely weeks.  While she still has had some intermittent nutritional intake, her confusion continues to worsen and her intake has been decreasing.  With new discovery of brain lesion with edema and midline shift, I anticipate that her condition will continue to deteriorate quickly.  She is currently requiring continuous infusion of medication for pain and is likely to continue to develop further symptoms in need of aggressive management.  I agree that she would best be served by placement at residential hospice if it can be arranged.    Discharge Planning:  Pursue placement at [place] was discussed with son, Vaughan Sine agent, Dr. Quincy Simmonds, and Dr. Benay Spice.   Thank you for allowing the Palliative Medicine Team to assist in the care of this patient.   Total Time 40 Prolonged Time Billed  no       Greater than 50%  of this time was spent counseling and coordinating care related to the above assessment and plan.  Micheline Rough, MD [place] Team [phone]  Please contact Palliative Medicine Team phone at [phone] for questions and concerns.

## 2017-01-21 DIAGNOSIS — G939 Disorder of brain, unspecified: Secondary | ICD-10-CM

## 2017-01-21 DIAGNOSIS — J432 Centrilobular emphysema: Secondary | ICD-10-CM

## 2017-01-21 DIAGNOSIS — M84552A Pathological fracture in neoplastic disease, left femur, initial encounter for fracture: Principal | ICD-10-CM

## 2017-01-21 DIAGNOSIS — C3490 Malignant neoplasm of unspecified part of unspecified bronchus or lung: Secondary | ICD-10-CM

## 2017-01-21 DIAGNOSIS — C419 Malignant neoplasm of bone and articular cartilage, unspecified: Secondary | ICD-10-CM

## 2017-01-21 MED ORDER — DEXAMETHASONE 4 MG PO TABS: 4.0000 mg | ORAL_TABLET | Freq: Four times a day (QID) | ORAL | Status: AC

## 2017-01-21 MED ORDER — ALPRAZOLAM 0.25 MG PO TABS: 0.2500 mg | ORAL_TABLET | Freq: Three times a day (TID) | ORAL | 0 refills | Status: AC | PRN

## 2017-01-21 NOTE — Progress Notes (Signed)
Patient discharged to [facility] via [place]. Report called to Ship broker at [facility].

## 2017-01-21 NOTE — Progress Notes (Signed)
0900--[facility] [place]--RN Visit  Received request from Sharren Bridge, [place] for family interest in [facility] with request with request to transfer today. Chart reviewed and received report from bedside RN. Met with patient and son, [name], to confirm interest and explain services. Son agreeable to transfer today. CSW, Joelene Millin, aware. Registration paperwork completed. Dr. Orpah Melter to assume care per family request.   Discharge summary has been faxed to [phone].  RN please call report to [phone].  [facility] was called for transport at 1015am.  Thank you. Margaretmary Eddy, RN [facility] Liaison [phone]  [place] are on [place].

## 2017-01-21 NOTE — Discharge Summary (Signed)
Physician Discharge Summary  Melissa Ferrell  JOI:[id number]  DOB: 1936-01-08  DOA: 01/12/2017 PCP: Girtha Rm, NP-C  Admit date: 01/12/2017 Discharge date: 01/21/2017  Admitted From: ALF Disposition:  [facility]   Recommendations for Outpatient Follow-up:  1. Com[facility] care   Discharge Condition: Hospice - poor prognosis  CODE STATUS: DNR  Diet recommendation: Regular    Brief/Interim Summary: For full details see H&P/Progress note, but in brief, Melissa Ferrell is a 82 year old female with past medical history of recently diagnosed malignancy of bone of unknown type, CAD, RA, hypertension, COPD, CKD who was brought to the emergency department after slipping off her chair and injuring her leg. She was just placed on hospice supportive care for pain control. Patient was found to have a left distal femoral fracture on presentation. Underwent intramedullary retrograde femoral nailing on the left. Patient was with severe pain post op, for which palliative care was consulted for pain management. Patient developed delirium which initially attributed to pain medication but after adjusting meds continued to decline, for which CT head was done and showed brain mass suspected mets with edema and left shift. Patient was on hospice PTA, with new findings and rapid decline, family opted to for full residential hospice. Patient was discharged to [facility].   Subjective: Patient seen and examined, she report no complaints today, she does not want to speak with me stating that I can't help her anymore. Patient state pain is ok.   Discharge Diagnoses/Hospital Course:  Principal Problem:   Femur fracture (HCC) Active Problems:   Essential hypertension   COPD (chronic obstructive pulmonary disease) with emphysema (HCC)   Depression and anxiety   Generalized anxiety disorder   CKD (chronic kidney disease) stage 3, GFR 30-59 ml/min (HCC)   Neoplasm of bone, malignant  (HCC)   Encounter for palliative care   Goals of care, counseling/discussion   Malignant neoplasm of lung ([place])  Left Femur fracture - Pathological fracture with metastatic bone tumor. Status post intramedullary retrograde femoral nailing on the left. - Pain controlled with PCA dilaudid - continue pain management per Hospice recommendations   Delirium  - Brain mass suspecetd mets to brain with edema and left shift  - Continue decadron patient more coherent after steroids - switch to PO 4 mg QID - Xanax switched to PRN to decrease CNS sedation   Malignant neoplasm of bone: - Poor prognosis  - Rad oncology consulted and did not recommended radiation therapy, risk outweigh the benefits  - Medical oncology consulted - recommended hospice   Essential hypertension - BP stable  - Continue Coreg  COPD - Stable  - Monitor resp status closely as patient is on continues opiates   CKD stage III - Cr at baseline   Discharge Instructions  You were cared for by a hospitalist during your hospital stay. If you have any questions about your discharge medications or the care you received while you were in the hospital after you are discharged, you can call the unit and asked to speak with the hospitalist on call if the hospitalist that took care of you is not available. Once you are discharged, your primary care physician will handle any further medical issues. Please note that NO REFILLS for any discharge medications will be authorized once you are discharged, as it is imperative that you return to your primary care physician (or establish a relationship with a primary care physician if you do not have one) for your aftercare needs so that  they can reassess your need for medications and monitor your lab values.  Discharge Instructions    Call MD for:  difficulty breathing, headache or visual disturbances   Complete by:  As directed    Call MD for:  extreme fatigue   Complete by:  As  directed    Call MD for:  hives   Complete by:  As directed    Call MD for:  persistant dizziness or light-headedness   Complete by:  As directed    Call MD for:  persistant nausea and vomiting   Complete by:  As directed    Call MD for:  redness, tenderness, or signs of infection (pain, swelling, redness, odor or green/yellow discharge around incision site)   Complete by:  As directed    Call MD for:  severe uncontrolled pain   Complete by:  As directed    Call MD for:  temperature >100.4   Complete by:  As directed    Diet general   Complete by:  As directed    Increase activity slowly   Complete by:  As directed      Allergies as of 01/21/2017      Reactions   Atrovent [ipratropium] Anaphylaxis   Bee Venom Anaphylaxis   Qvar [beclomethasone] Anaphylaxis   Stiolto Respimat [tiotropium Bromide-olodaterol] Anaphylaxis   Anoro Ellipta [umeclidinium-vilanterol]    HEADACHES   Prednisone Other (See Comments)   Per patient: was told that she was not to take it by her early Rheumatology physician   Benzocaine Itching, Other (See Comments)   Blisters   Breo Ellipta [fluticasone Furoate-vilanterol]    Headache, anxiety.    Bystolic [nebivolol Hcl]    Cefdinir Other (See Comments)   unknown   Ciprofloxacin Other (See Comments)   unknown   Diphenhydramine Hcl Other (See Comments)   "benadryl"--pain   Erythromycin Other (See Comments)   Headache   Indomethacin Other (See Comments)   Headache    Lasix [furosemide] Other (See Comments)   Low tolerance - swelling    Other    Adhesive tape--blisters   Statins    Low tolerance    Tobramycin Itching   Zetia [ezetimibe]    Low tolerance   Azithromycin Rash   Paba Derivatives Rash   Penicillins Itching, Rash   Has patient had a PCN reaction causing immediate rash, facial/tongue/throat swelling, SOB or lightheadedness with hypotension: No Has patient had a PCN reaction causing severe rash involving mucus membranes or skin  necrosis: No Has patient had a PCN reaction that required hospitalization: No Has patient had a PCN reaction occurring within the last 10 years: No If all of the above answers are "NO", then may proceed with Cephalosporin use.      Medication List    STOP taking these medications   aspirin 81 MG tablet   doxycycline 100 MG tablet Commonly known as:  VIBRA-TABS   gabapentin 100 MG capsule Commonly known as:  NEURONTIN   gabapentin 300 MG capsule Commonly known as:  NEURONTIN   hydrochlorothiazide 12.5 MG capsule Commonly known as:  MICROZIDE   KRILL OIL PO   Lecithin 1200 MG Caps   losartan 50 MG tablet Commonly known as:  COZAAR   Magnesium 250 MG Tabs   multivitamin-lutein Caps capsule   vitamin B-12 500 MCG tablet Commonly known as:  CYANOCOBALAMIN     TAKE these medications   acetaminophen 500 MG tablet Commonly known as:  TYLENOL Take 1,000 mg by mouth 3 (  three) times daily.   albuterol 108 (90 Base) MCG/ACT inhaler Commonly known as:  PROVENTIL HFA;VENTOLIN HFA Inhale 2 puffs into the lungs every 6 (six) hours as needed for wheezing or shortness of breath.   ALPRAZolam 0.25 MG tablet Commonly known as:  XANAX Take 1 tablet (0.25 mg total) by mouth 3 (three) times daily as needed for anxiety. What changed:  when to take this   carvedilol 12.5 MG tablet Commonly known as:  COREG Take 2 tablets (25 mg total) by mouth 2 (two) times daily.   dexamethasone 4 MG tablet Commonly known as:  DECADRON Take 1 tablet (4 mg total) by mouth 4 (four) times daily.   folic acid 161 MCG tablet Commonly known as:  FOLVITE Take 400 mcg by mouth daily.   OPTIVE 0.5-0.9 % ophthalmic solution Generic drug:  carboxymethylcellul-glycerin Place 1 drop into both eyes daily. --dry eyes   tiotropium 18 MCG inhalation capsule Commonly known as:  SPIRIVA Place 1 capsule (18 mcg total) into inhaler and inhale daily.       Contact information for follow-up providers     Henson, Vickie L, NP-C Follow up.   Specialty:  Family Medicine Contact information: [place] [address] [phone]        Wylene Simmer, MD. Schedule an appointment as soon as possible for a visit in 2 week(s).   Specialty:  Orthopedic Surgery Contact information: [address] [place] [address] [phone]            Contact information for after-discharge care    Destination    [facility] Follow up.   Service:  Skilled Nursing Contact information: [address] [place] [phone]                 Allergies  Allergen Reactions  . Atrovent [Ipratropium] Anaphylaxis  . Bee Venom Anaphylaxis  . Qvar [Beclomethasone] Anaphylaxis  . Stiolto Respimat [Tiotropium Bromide-Olodaterol] Anaphylaxis  . Anoro Ellipta [Umeclidinium-Vilanterol]     HEADACHES  . Prednisone Other (See Comments)    Per patient: was told that she was not to take it by her early Rheumatology physician  . Benzocaine Itching and Other (See Comments)    Blisters  . Breo Ellipta [Fluticasone Furoate-Vilanterol]     Headache, anxiety.   Thayer Jew Hcl]   . Cefdinir Other (See Comments)    unknown  . Ciprofloxacin Other (See Comments)    unknown  . Diphenhydramine Hcl Other (See Comments)    "benadryl"--pain   . Erythromycin Other (See Comments)    Headache   . Indomethacin Other (See Comments)    Headache   . Lasix [Furosemide] Other (See Comments)    Low tolerance - swelling    . Other     Adhesive tape--blisters  . Statins     Low tolerance    . Tobramycin Itching  . Zetia [Ezetimibe]     Low tolerance   . Azithromycin Rash  . Paba Derivatives Rash  . Penicillins Itching and Rash    Has patient had a PCN reaction causing immediate rash, facial/tongue/throat swelling, SOB or lightheadedness with hypotension: No Has patient had a PCN reaction causing severe rash involving mucus  membranes or skin necrosis: No Has patient had a PCN reaction that required hospitalization: No Has patient had a PCN reaction occurring within the last 10 years: No If all of the above answers are "NO", then may proceed with Cephalosporin use.  Consultations: Oncology  Palliative Care  Radiation Oncology  Orthopedic Surgery   Procedures/Studies: Ct Head Wo Contrast  Result Date: 01/19/2017 CLINICAL DATA:  82 y/o F; altered level of consciousness. Delirium. Recent femur fracture. EXAM: CT HEAD WITHOUT CONTRAST TECHNIQUE: Contiguous axial images were obtained from the base of the skull through the vertex without intravenous contrast. COMPARISON:  None. FINDINGS: Brain: Oval hypodense mass in the right frontal lobe measuring 2.4 x 1.6 x 2.7 cm (AP x ML x CC series 2, image 25 and series 5, image 23) with surrounding region of white matter hypoattenuation likely representing edema. There is mild local mass effect with sulcal effacement and 3 mm right-to-left midline shift. No large acute stroke or hemorrhage is identified. Mild chronic microvascular ischemic changes and parenchymal volume loss of the brain are present. Vascular: Nonspecific calcification right sylvian fissure. Skull: Normal. Negative for fracture or focal lesion. Sinuses/Orbits: No acute finding. Other: None. IMPRESSION: 1. Round hypodense mass in right frontal lobe measuring up to 2.7 cm with surrounding edema. Findings are suspicious for brain abscess. Cystic CNS neoplasm or metastasis is possible. A demyelinating or inflammatory lesion is unlikely. MRI of the brain preferably with and without contrast is recommended. 2. Mild mass effect with 3 mm right-to-left midline shift. No downward herniation. These results will be called to the ordering clinician or representative by the Radiologist Assistant, and communication documented in the PACS or zVision Dashboard. Electronically Signed   By: Kristine Garbe M.D.   On:  01/19/2017 19:10   Dg Chest Port 1 View  Result Date: 01/12/2017 CLINICAL DATA:  Preop.  Femur fracture. EXAM: PORTABLE CHEST 1 VIEW COMPARISON:  09/26/2016 FINDINGS: The cardiac silhouette is upper limits of normal in size, unchanged. Aortic atherosclerosis is noted. The lungs are slightly hyperinflated with slight interstitial coarsening consistent with history of COPD. No confluent airspace opacity, edema, pleural effusion, or pneumothorax is identified. IMPRESSION: No active disease. Electronically Signed   By: Logan Bores M.D.   On: 01/12/2017 10:27   Dg C-arm 1-60 Min-no Report  Result Date: 01/12/2017 Fluoroscopy was utilized by the requesting physician.  No radiographic interpretation.   Dg Hip Unilat W Or Wo Pelvis 2-3 Views Left  Result Date: 01/12/2017 CLINICAL DATA:  Slipped to the for while getting into a wheelchair this morning. The patient is complaining of left femur pain. Patient has known metastatic disease to the right hip. EXAM: DG HIP (WITH OR WITHOUT PELVIS) 2-3V LEFT COMPARISON:  Left femur series of today's date FINDINGS: The bones are subjectively osteopenic. The bony pelvis appears intact. AP and lateral views of the left hip reveal narrowing of the joint space. The acetabulum and femoral head appear intact. The femoral neck, intertrochanteric, and immediate sub trochanteric regions are normal. IMPRESSION: There is mild osteoarthritic joint space loss of the left hip. No acute fracture or lytic or blastic lesion of the left hip is observed. Electronically Signed   By: David  Martinique M.D.   On: 01/12/2017 10:03   Dg Femur Min 2 Views Left  Result Date: 01/12/2017 CLINICAL DATA:  Open reduction internal fixation for fracture EXAM: LEFT FEMUR 2 VIEWS COMPARISON:  Preoperative evaluation January 12, 2017 FLUOROSCOPY TIME:  1 minutes 27 seconds; 9.10 mGy ; 4 acquired images FINDINGS: Frontal and lateral views were obtained. There is screw and nail fixation through a  comminuted fracture of the distal femoral diaphysis. Alignment at the fracture site is near anatomic. No other fractures are evident. No dislocation. Visualized joint  spaces appear unremarkable. IMPRESSION: Near anatomic alignment following screw and nail fixation through a comminuted fracture of the distal femoral diaphysis. No new fracture evident. No evident dislocation. Electronically Signed   By: Lowella Grip III M.D.   On: 01/12/2017 18:51   Dg Femur Min 2 Views Left  Result Date: 01/12/2017 CLINICAL DATA:  The patient slipped to the floor while getting into this wheelchair this morning. The patient is complaining of left hip pain and has obvious deformity. EXAM: LEFT FEMUR 2 VIEWS COMPARISON:  Left hip series of today's date. FINDINGS: There is an acute displaced fracture of the distal radial metadiaphysis. There is angulation with the apex anterior. The fracture line does not reach the femoral condyles. More proximally the femoral shaft is intact. The femoral head, neck, and intertrochanteric regions or included on the left hip series and wrist reveal no acute abnormalities. IMPRESSION: There is an acute angulated displaced fracture of the distal left femoral metadiaphysis. Electronically Signed   By: David  Martinique M.D.   On: 01/12/2017 10:04    Discharge Exam: Vitals:   01/20/17 2200 01/21/17 0520  BP: (!) 181/97 112/89  Pulse: 80 69  Resp: 16 16  Temp: 98.2 F (36.8 C) 97.7 F (36.5 C)  SpO2: 93% 93%   Vitals:   01/20/17 1640 01/20/17 1811 01/20/17 2200 01/21/17 0520  BP: (!) 186/101 (!) 150/72 (!) 181/97 112/89  Pulse: 77 78 80 69  Resp:  18 16 16   Temp:  98.5 F (36.9 C) 98.2 F (36.8 C) 97.7 F (36.5 C)  TempSrc:  Oral Oral Oral  SpO2:  92% 93% 93%  Weight:      Height:        General: Pt is alert, awake, not in acute distress Cardiovascular: RRR, S1/S2 +, no rubs, no gallops Respiratory: CTA bilaterally Abdominal: Soft,  Extremities: LE edema b/l trace     The results of significant diagnostics from this hospitalization (including imaging, microbiology, ancillary and laboratory) are listed below for reference.     Microbiology: No results found for this or any previous visit (from the past 240 hour(s)).   Labs: BNP (last 3 results) Recent Labs    05/19/16 1116  BNP 938.1*   Basic Metabolic Panel: Recent Labs  Lab 01/15/17 0730 01/16/17 0408 01/18/17 0431 01/19/17 0502  NA 136 136 136  --   K 3.5 3.5 3.5  --   CL 104 105 105  --   CO2 25 26 26   --   GLUCOSE 110* 94 96  --   BUN 12 13 14   --   CREATININE 0.78 0.82 0.90 0.74  CALCIUM 8.6* 8.8* 8.8*  --    Liver Function Tests: No results for input(s): AST, ALT, ALKPHOS, BILITOT, PROT, ALBUMIN in the last 168 hours. No results for input(s): LIPASE, AMYLASE in the last 168 hours. No results for input(s): AMMONIA in the last 168 hours. CBC: Recent Labs  Lab 01/15/17 0730 01/16/17 0408 01/18/17 0431  WBC 8.9 6.3 7.5  HGB 10.2* 9.3* 9.8*  HCT 31.0* 27.9* 29.9*  MCV 93.9 95.5 94.0  PLT 219 224 269   Cardiac Enzymes: No results for input(s): CKTOTAL, CKMB, CKMBINDEX, TROPONINI in the last 168 hours. BNP: Invalid input(s): POCBNP CBG: No results for input(s): GLUCAP in the last 168 hours. D-Dimer No results for input(s): DDIMER in the last 72 hours. Hgb A1c No results for input(s): HGBA1C in the last 72 hours. Lipid Profile No results for input(s): CHOL, HDL,  LDLCALC, TRIG, CHOLHDL, LDLDIRECT in the last 72 hours. Thyroid function studies No results for input(s): TSH, T4TOTAL, T3FREE, THYROIDAB in the last 72 hours.  Invalid input(s): FREET3 Anemia work up No results for input(s): VITAMINB12, FOLATE, FERRITIN, TIBC, IRON, RETICCTPCT in the last 72 hours. Urinalysis    Component Value Date/Time   BILIRUBINUR neg 01/26/2014 0939   PROTEINUR neg 01/26/2014 0939   UROBILINOGEN 0.2 01/26/2014 0939   NITRITE neg 01/26/2014 0939   LEUKOCYTESUR Trace 01/26/2014  0939   Sepsis Labs Invalid input(s): PROCALCITONIN,  WBC,  LACTICIDVEN Microbiology No results found for this or any previous visit (from the past 240 hour(s)).   Time coordinating discharge: 35 minutes  SIGNED:  Chipper Oman, MD  [facility] 01/21/2017, 9:56 AM  Pager please text page via  [email or web address]

## 2017-01-21 NOTE — Progress Notes (Signed)
Patient discharging to [facility]. CSW notified by [facility] hospital liaison that patient's paperwork was completed. Hospital liaison contacted [facility] and coordinated transportation, patient's family aware. Patient's RN provided with packet. CSW signing off, no other needs identified at this time.  Abundio Miu, [place] Social Worker [facility] Cell#: [phone]

## 2017-01-21 NOTE — Progress Notes (Signed)
Daily Progress Note   Patient Name: Melissa Ferrell       Date: 01/21/2017 DOB: 1935-04-12  Age: 82 y.o. MRN#: [id number] Attending Physician: No att. providers found Primary Care Physician: Girtha Rm, NP-C Admit Date: 01/12/2017  Reason for Consultation/Follow-up: Establishing goals of care  Subjective: Patient is resting in bed, she is on basal rate Dilaudid PCA.    Reports pain is currently well controlled.  She continues to [employer] daily.  See below  Length of Stay: 9  Current Medications: Scheduled Meds:    Continuous Infusions:   PRN Meds:   Physical Exam         Elderly lady resting in bed She is in no distress Eyes closed resting peacefully Pleasantly confused Regular S1 S2 Abdomen soft No edema  Vital Signs: BP (!) 152/85 (BP Location: Right Arm)   Pulse 72   Temp 97.7 F (36.5 C) (Oral)   Resp 16   Ht 5\' 4"  (1.626 m)   Wt 80.7 kg (178 lb)   SpO2 91%   BMI 30.55 kg/m  SpO2: SpO2: 91 % O2 Device: O2 Device: Not Delivered O2 Flow Rate: O2 Flow Rate (L/min): 2 L/min  Intake/output summary:   Intake/Output Summary (Last 24 hours) at 01/21/2017 1739 Last data filed at 01/21/2017 0900 Gross per 24 hour  Intake 150 ml  Output 2900 ml  Net -2750 ml   LBM: Last BM Date: 01/16/17 Baseline Weight: Weight: 80.7 kg (178 lb) Most recent weight: Weight: 80.7 kg (178 lb)       Palliative Assessment/Data: PPS 30%   Flowsheet Rows     Most Recent Value  Intake Tab  Referral Department  Hospitalist  Unit at Time of Referral  Oncology Unit  Palliative Care Primary Diagnosis  Cancer  Date Notified  01/14/17  Palliative Care Type  New Palliative care  Reason for referral  Pain  Date of Admission  01/12/17  Date first seen by Palliative Care   01/15/17  # of days Palliative referral response time  1 Day(s)  # of days IP prior to Palliative referral  2  Clinical Assessment  Palliative Performance Scale Score  30%  Pain Max last 24 hours  6  Pain Min Last 24 hours  4  Dyspnea Max Last 24 Hours  3  Dyspnea  Min Last 24 hours  2  Nausea Max Last 24 Hours  3  Nausea Min Last 24 Hours  2  Anxiety Max Last 24 Hours  4  Anxiety Min Last 24 Hours  3  Psychosocial & Spiritual Assessment  Palliative Care Outcomes  Patient/Family meeting held?  Yes  Who was at the meeting?  patient, son [name] who is HCPOA agent.       Patient Active Problem List   Diagnosis Date Noted  . Malignant neoplasm of lung ([place])   . Encounter for palliative care   . Goals of care, counseling/discussion   . Neoplasm of bone, malignant ([place]) 01/13/2017  . Femur fracture ([place]) 01/12/2017  . Pathological fracture in neoplastic disease, left femur, initial encounter for fracture ([place])   . CKD (chronic kidney disease) stage 3, GFR 30-59 ml/min (HCC) 08/24/2016  . Osteopenia determined by x-ray   . Generalized anxiety disorder 07/27/2015  . Depression and anxiety 12/02/2014  . Shortness of breath 07/01/2014  . PVD (peripheral vascular disease) ([place]) 05/02/2014  . Lower extremity edema 04/30/2014  . Varicose veins of lower extremities with other complications 53/29/9242  . Cerebrovascular disease 08/20/2010  . Hyperlipemia 10/15/2006  . MIGRAINE HEADACHE 10/15/2006  . Essential hypertension 10/15/2006  . COPD (chronic obstructive pulmonary disease) with emphysema ([place]) 10/15/2006    Palliative Care Assessment & Plan   Patient Profile:    Assessment:  recent L femur fracture s/p repair Recent diagnosis of cancer of bone, deemed to have lung primary, saw oncologist at [facility] last month HTN COPD Has depression anxiety and generalized anxiety disorder Uncontrolled pain Existential suffering Possibly entering into the dying process Stage III  CKD  Recommendations/Plan:  Continue Dilaudid infusion at 0.3 mg per hour. Continue IV PRN Dilaudid 0.5 mg Q 1 hours PRN.    She continues to have periods of anxiety.  Continue scheduled low dose Xanax per request by family.  After discussing with Dr. Benay Spice, son would like to pursue placement at [facility] for end of life care.  [facility] for EOL care.   Code Status:    Code Status Orders  (From admission, onward)        Start     Ordered   01/15/17 1017  Do not attempt resuscitation (DNR)  Continuous    Question Answer Comment  In the event of cardiac or respiratory ARREST Do not call a "code blue"   In the event of cardiac or respiratory ARREST Do not perform Intubation, CPR, defibrillation or ACLS   In the event of cardiac or respiratory ARREST Use medication by any route, position, wound care, and other measures to relive pain and suffering. May use oxygen, suction and manual treatment of airway obstruction as needed for comfort.      01/15/17 1020    Code Status History    Date Active Date Inactive Code Status Order ID Comments User Context   01/12/2017 18:16 01/15/2017 10:20 Full Code [id number]  Wylene Simmer, MD Inpatient   01/12/2017 18:16 01/12/2017 18:16 DNR [id number]  Phillips Grout, MD Inpatient    Advance Directive Documentation     Most Recent Value  Type of Advance Directive  Out of facility DNR (pink MOST or yellow form)  Pre-existing out of facility DNR order (yellow form or pink MOST form)  No data  "MOST" Form in Place?  No data       Prognosis:   Likely weeks.  While she still has had some intermittent  nutritional intake, her confusion continues to worsen and her intake has been decreasing.  With new discovery of brain lesion with edema and midline shift, I anticipate that her condition will continue to deteriorate quickly.  She is currently requiring continuous infusion of medication for pain and is likely to continue to develop further  symptoms in need of aggressive management.  I agree that she would best be served by placement at residential hospice if it can be arranged.    Discharge Planning:  To [employer] [facility]  Thank you for allowing the Palliative Medicine Team to assist in the care of this patient.   Total Time 20 Prolonged Time Billed  no       Greater than 50%  of this time was spent counseling and coordinating care related to the above assessment and plan.  Micheline Rough, MD [facility] Team [phone]  Please contact Palliative Medicine Team phone at [phone] for questions and concerns.

## 2017-01-24 ENCOUNTER — Telehealth: Payer: Self-pay

## 2017-01-24 NOTE — Telephone Encounter (Signed)
Called patient son regarding information on current decadron dose and informed him that Dr. Benay Spice visited his mother this morning and agrees with current medications. Son voiced understanding.

## 2017-02-17 DEATH — deceased

## 2019-06-07 IMAGING — CT CT L SPINE W/O CM
3 of 4 series · 12 of 33 positions shown, 14 images · non-contrast
Comparison: None.

CLINICAL DATA: 81 y/o  F; back and bilateral hip pain.

EXAM:
CT LUMBAR SPINE WITHOUT CONTRAST
TECHNIQUE: Multidetector CT imaging of the lumbar spine was performed without
intravenous contrast administration. Multiplanar CT image
reconstructions were also generated.

[Series 3: l-spine · axial · 0.34mm/px · z∈[-232,-62]mm · 4 of 129 slices shown, 5 images]
[im 22/129  soft-tissue]
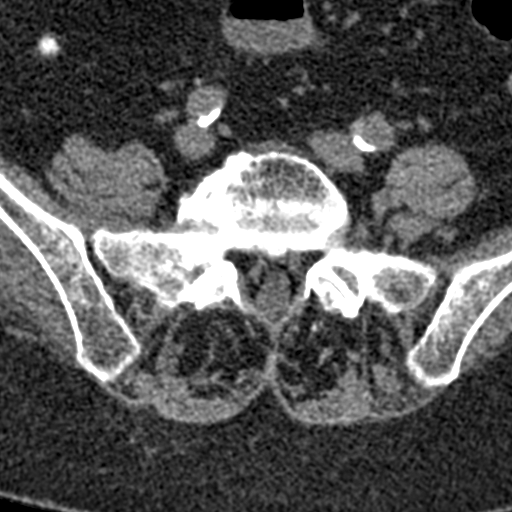
[im 22/129  bone]
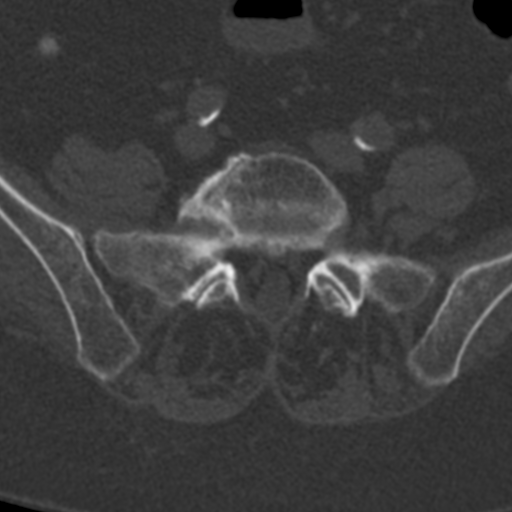
[im 43/129  bone]
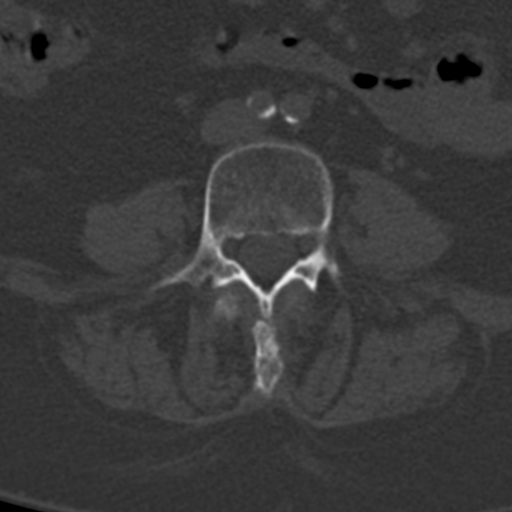
[im 86/129  bone]
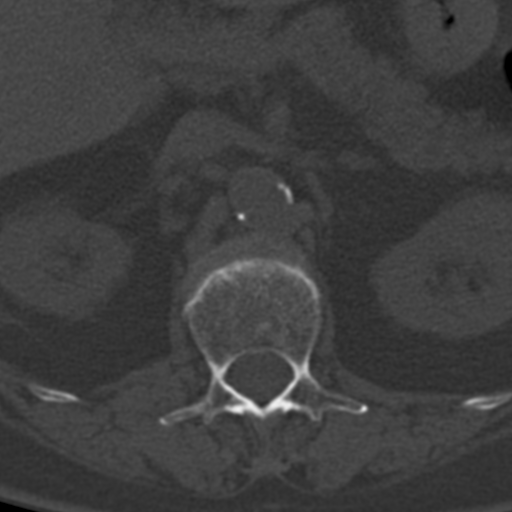
[im 107/129  bone]
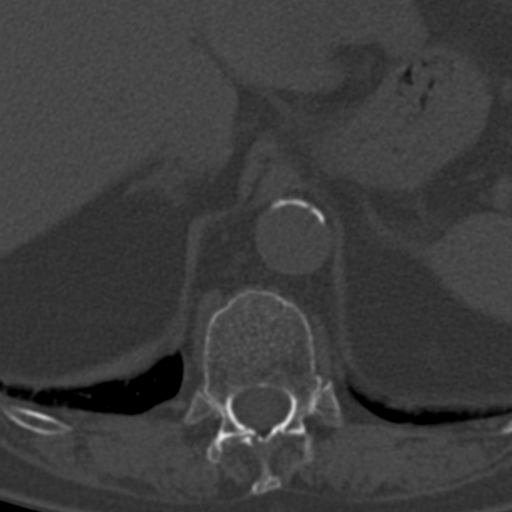

[Series 8: sagittal st · sagittal · 0.30mm/px · 5 of 70 slices shown, 6 images]
[im 24/70  bone]
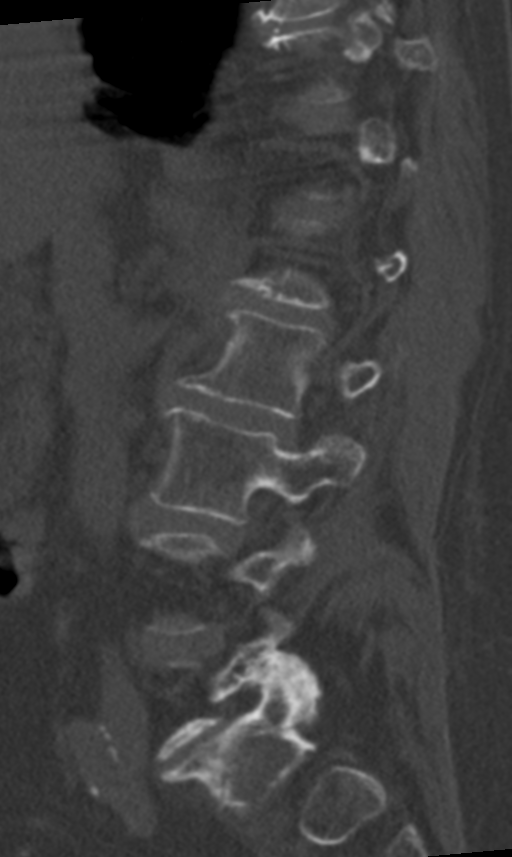
[im 29/70  bone]
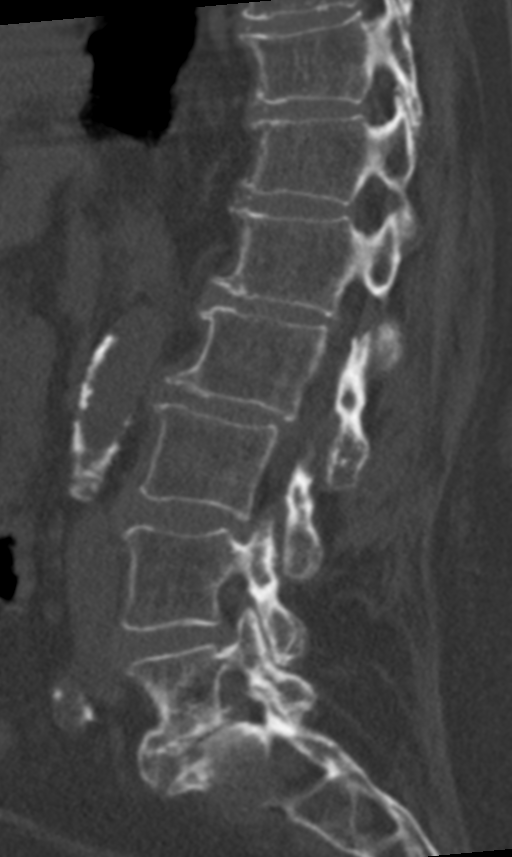
[im 35/70  soft-tissue]
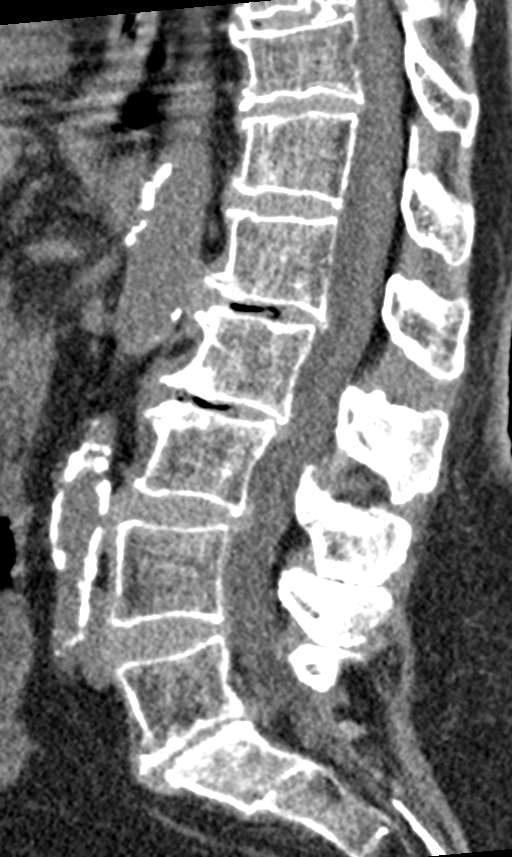
[im 35/70  bone]
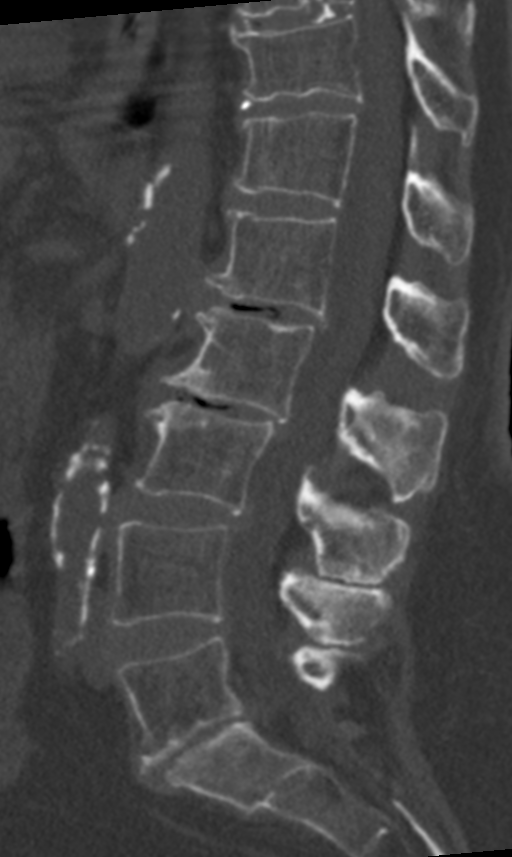
[im 41/70  bone]
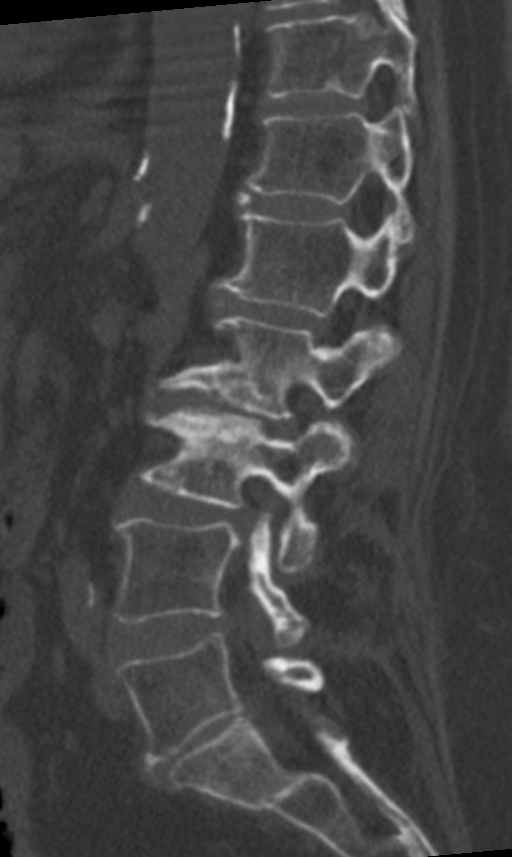
[im 47/70  bone]
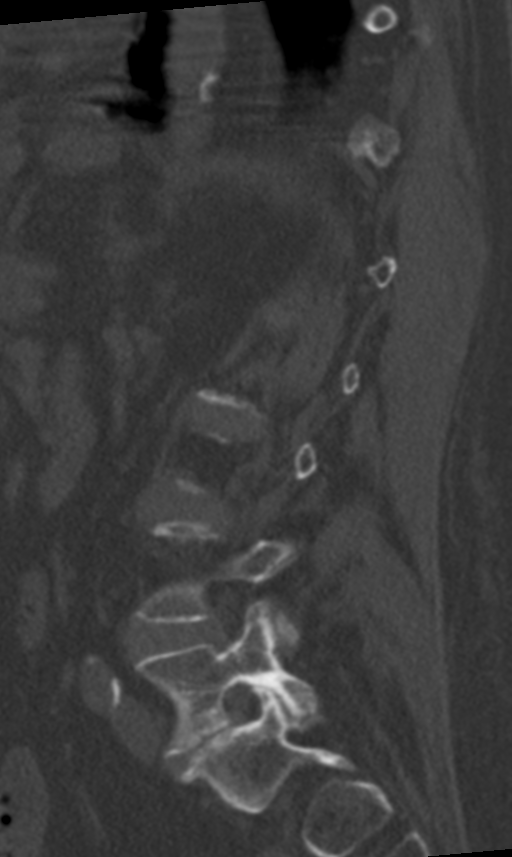

[Series 9: coronal st · coronal · 0.32mm/px · 3 of 78 slices shown]
[im 16/78  bone]
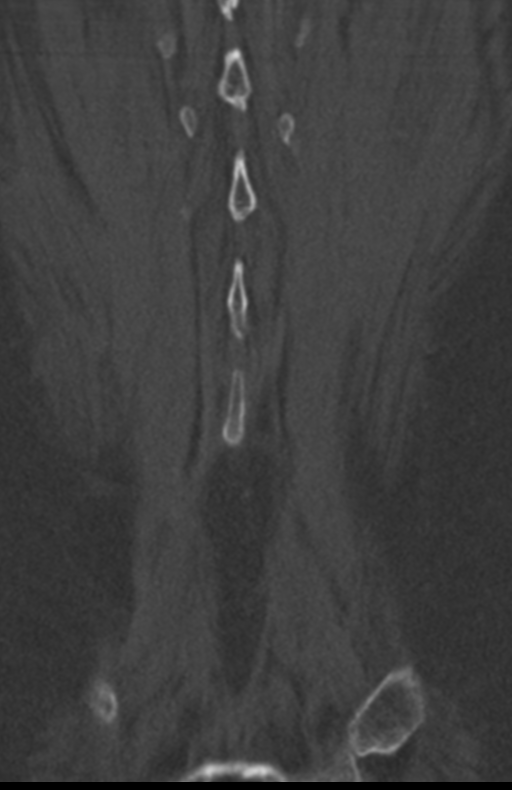
[im 31/78  bone]
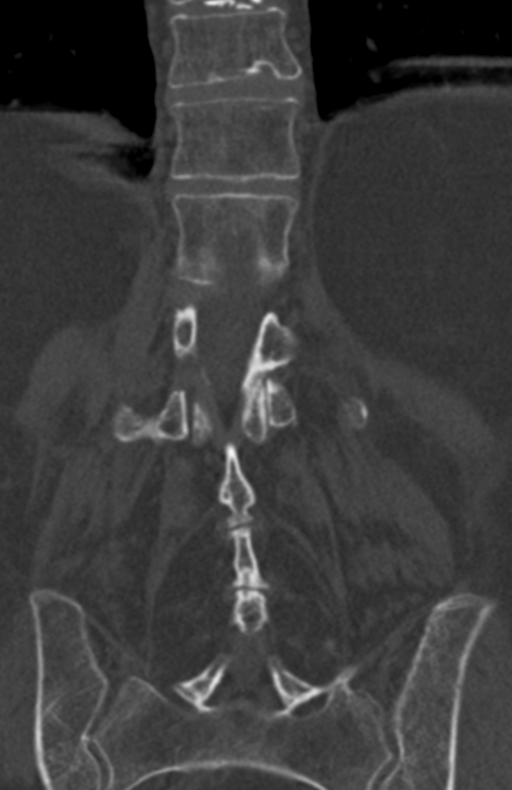
[im 47/78  bone]
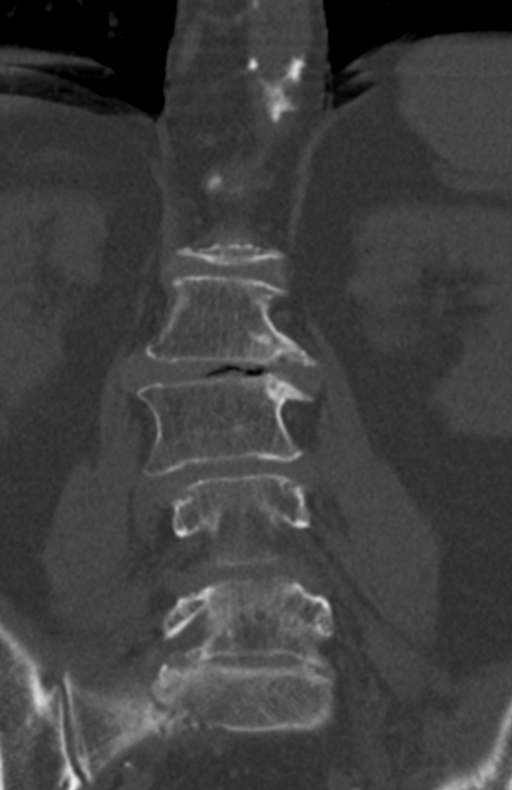

[12 of 33 positions shown; findings below may reference images not displayed]

FINDINGS: Segmentation: 5 lumbar type vertebrae.

Alignment: Mild reverse S curvature of the lumbar spine. Normal
lumbar lordosis without listhesis.

Vertebrae: No loss of vertebral body height. Minimally displaced
fracture in the right sacrum at the paramedian S1 level at the site
of bony lucency and irregular cortical destruction suspicious for a
pathologic lesion (Series 3, image 119). Mildly displaced fracture
of the left sacral ala near the sacroiliac joint (series 4, image
120).

Paraspinal and other soft tissues: Calcific atherosclerosis of the
abdominal aorta.

Disc levels: Moderate loss of height of L1-2 and L2-3 intervertebral
discs levels. Severe loss of height of L5-S1 intervertebral disc
level. Mild facet arthropathy.
IMPRESSION: 1. Mildly displaced acute left sacral ala fracture.
2. Fracture of the right paramedian sacrum at the S1 levels at site
of bony lucency and irregular cortical destruction suspicious for
pathologic lesion.
3. No acute fracture of the lumbar spine.
4. Mild reverse S curvature of lumbar spine and degenerative changes
greatest at L5-S1 level. No high-grade bony canal stenosis.

By: Pascual Hansen M.D.
